# Patient Record
Sex: Female | Born: 1955 | Race: White | Hispanic: No | Smoking: Former smoker
Health system: Southern US, Community
[De-identification: ages and names within clinical notes are randomized; demographics above are authoritative.]

## PROBLEM LIST (undated history)

## (undated) DIAGNOSIS — I10 Essential (primary) hypertension: Secondary | ICD-10-CM

## (undated) DIAGNOSIS — Z9889 Other specified postprocedural states: Secondary | ICD-10-CM

## (undated) DIAGNOSIS — J189 Pneumonia, unspecified organism: Secondary | ICD-10-CM

## (undated) DIAGNOSIS — I428 Other cardiomyopathies: Secondary | ICD-10-CM

## (undated) DIAGNOSIS — F1010 Alcohol abuse, uncomplicated: Secondary | ICD-10-CM

## (undated) DIAGNOSIS — M109 Gout, unspecified: Secondary | ICD-10-CM

## (undated) HISTORY — PX: TONSILLECTOMY: SUR1361

## (undated) HISTORY — PX: KNEE ARTHROSCOPY: SUR90

---

## 1998-06-04 ENCOUNTER — Ambulatory Visit (HOSPITAL_COMMUNITY): Admission: RE | Admit: 1998-06-04 | Discharge: 1998-06-04 | Payer: Self-pay | Admitting: Gastroenterology

## 1998-12-11 ENCOUNTER — Other Ambulatory Visit: Admission: RE | Admit: 1998-12-11 | Discharge: 1998-12-11 | Payer: Self-pay | Admitting: Gynecology

## 1999-12-24 ENCOUNTER — Other Ambulatory Visit: Admission: RE | Admit: 1999-12-24 | Discharge: 1999-12-24 | Payer: Self-pay | Admitting: Gynecology

## 2001-01-22 ENCOUNTER — Other Ambulatory Visit: Admission: RE | Admit: 2001-01-22 | Discharge: 2001-01-22 | Payer: Self-pay | Admitting: Gynecology

## 2002-02-18 ENCOUNTER — Other Ambulatory Visit: Admission: RE | Admit: 2002-02-18 | Discharge: 2002-02-18 | Payer: Self-pay | Admitting: Gynecology

## 2003-06-26 ENCOUNTER — Other Ambulatory Visit: Admission: RE | Admit: 2003-06-26 | Discharge: 2003-06-26 | Payer: Self-pay | Admitting: Gynecology

## 2004-03-11 ENCOUNTER — Other Ambulatory Visit: Admission: RE | Admit: 2004-03-11 | Discharge: 2004-03-11 | Payer: Self-pay | Admitting: Gynecology

## 2004-10-30 ENCOUNTER — Encounter: Admission: RE | Admit: 2004-10-30 | Discharge: 2004-10-30 | Payer: Self-pay | Admitting: Internal Medicine

## 2005-09-22 ENCOUNTER — Other Ambulatory Visit: Admission: RE | Admit: 2005-09-22 | Discharge: 2005-09-22 | Payer: Self-pay | Admitting: Gynecology

## 2006-10-26 ENCOUNTER — Other Ambulatory Visit: Admission: RE | Admit: 2006-10-26 | Discharge: 2006-10-26 | Payer: Self-pay | Admitting: Gynecology

## 2008-01-24 ENCOUNTER — Other Ambulatory Visit: Admission: RE | Admit: 2008-01-24 | Discharge: 2008-01-24 | Payer: Self-pay | Admitting: Gynecology

## 2012-01-12 ENCOUNTER — Other Ambulatory Visit: Payer: Self-pay | Admitting: Family Medicine

## 2012-01-12 ENCOUNTER — Ambulatory Visit
Admission: RE | Admit: 2012-01-12 | Discharge: 2012-01-12 | Disposition: A | Discharge status: Home / Self Care | Payer: BC Managed Care – PPO | Source: Ambulatory Visit | Attending: Family Medicine | Admitting: Family Medicine

## 2012-01-12 DIAGNOSIS — S060XAA Concussion with loss of consciousness status unknown, initial encounter: Secondary | ICD-10-CM

## 2012-01-12 DIAGNOSIS — S060X9A Concussion with loss of consciousness of unspecified duration, initial encounter: Secondary | ICD-10-CM

## 2017-09-18 ENCOUNTER — Other Ambulatory Visit: Payer: Self-pay

## 2017-09-18 ENCOUNTER — Emergency Department (HOSPITAL_COMMUNITY): Payer: BC Managed Care – PPO

## 2017-09-18 ENCOUNTER — Inpatient Hospital Stay (HOSPITAL_COMMUNITY)
Admission: EM | Admit: 2017-09-18 | Discharge: 2017-09-25 | DRG: 286 | Disposition: A | Discharge status: Home / Self Care | Payer: BC Managed Care – PPO | Attending: Cardiovascular Disease | Admitting: Cardiovascular Disease

## 2017-09-18 DIAGNOSIS — Z79899 Other long term (current) drug therapy: Secondary | ICD-10-CM

## 2017-09-18 DIAGNOSIS — R55 Syncope and collapse: Secondary | ICD-10-CM | POA: Diagnosis not present

## 2017-09-18 DIAGNOSIS — I5021 Acute systolic (congestive) heart failure: Secondary | ICD-10-CM

## 2017-09-18 DIAGNOSIS — R0609 Other forms of dyspnea: Secondary | ICD-10-CM

## 2017-09-18 DIAGNOSIS — I110 Hypertensive heart disease with heart failure: Principal | ICD-10-CM | POA: Diagnosis present

## 2017-09-18 DIAGNOSIS — F1010 Alcohol abuse, uncomplicated: Secondary | ICD-10-CM

## 2017-09-18 DIAGNOSIS — D7589 Other specified diseases of blood and blood-forming organs: Secondary | ICD-10-CM | POA: Diagnosis present

## 2017-09-18 DIAGNOSIS — M109 Gout, unspecified: Secondary | ICD-10-CM | POA: Diagnosis present

## 2017-09-18 DIAGNOSIS — Z91013 Allergy to seafood: Secondary | ICD-10-CM

## 2017-09-18 DIAGNOSIS — J449 Chronic obstructive pulmonary disease, unspecified: Secondary | ICD-10-CM | POA: Diagnosis present

## 2017-09-18 DIAGNOSIS — I447 Left bundle-branch block, unspecified: Secondary | ICD-10-CM

## 2017-09-18 DIAGNOSIS — I428 Other cardiomyopathies: Secondary | ICD-10-CM

## 2017-09-18 DIAGNOSIS — Z8701 Personal history of pneumonia (recurrent): Secondary | ICD-10-CM

## 2017-09-18 DIAGNOSIS — Z7141 Alcohol abuse counseling and surveillance of alcoholic: Secondary | ICD-10-CM

## 2017-09-18 DIAGNOSIS — W19XXXA Unspecified fall, initial encounter: Secondary | ICD-10-CM | POA: Diagnosis not present

## 2017-09-18 DIAGNOSIS — E785 Hyperlipidemia, unspecified: Secondary | ICD-10-CM | POA: Diagnosis present

## 2017-09-18 DIAGNOSIS — Z87891 Personal history of nicotine dependence: Secondary | ICD-10-CM

## 2017-09-18 DIAGNOSIS — I959 Hypotension, unspecified: Secondary | ICD-10-CM | POA: Diagnosis not present

## 2017-09-18 DIAGNOSIS — I10 Essential (primary) hypertension: Secondary | ICD-10-CM

## 2017-09-18 HISTORY — DX: Essential (primary) hypertension: I10

## 2017-09-18 HISTORY — DX: Other cardiomyopathies: I42.8

## 2017-09-18 HISTORY — DX: Other specified postprocedural states: Z98.890

## 2017-09-18 HISTORY — DX: Gout, unspecified: M10.9

## 2017-09-18 HISTORY — DX: Pneumonia, unspecified organism: J18.9

## 2017-09-18 HISTORY — DX: Alcohol abuse, uncomplicated: F10.10

## 2017-09-18 LAB — CBC
HEMATOCRIT: 40.8 % (ref 36.0–46.0)
Hemoglobin: 14 g/dL (ref 12.0–15.0)
MCH: 35.4 pg — ABNORMAL HIGH (ref 26.0–34.0)
MCHC: 34.3 g/dL (ref 30.0–36.0)
MCV: 103.3 fL — AB (ref 78.0–100.0)
Platelets: 164 10*3/uL (ref 150–400)
RBC: 3.95 MIL/uL (ref 3.87–5.11)
RDW: 12.6 % (ref 11.5–15.5)
WBC: 6.8 10*3/uL (ref 4.0–10.5)

## 2017-09-18 LAB — BASIC METABOLIC PANEL
ANION GAP: 13 (ref 5–15)
BUN: 16 mg/dL (ref 8–23)
CO2: 20 mmol/L — ABNORMAL LOW (ref 22–32)
Calcium: 9.3 mg/dL (ref 8.9–10.3)
Chloride: 107 mmol/L (ref 98–111)
Creatinine, Ser: 0.94 mg/dL (ref 0.44–1.00)
GFR calc non Af Amer: >60 mL/min (ref >60)
Glucose, Bld: 97 mg/dL (ref 70–99)
POTASSIUM: 3.7 mmol/L (ref 3.5–5.1)
SODIUM: 140 mmol/L (ref 135–145)

## 2017-09-18 LAB — I-STAT TROPONIN, ED: Troponin i, poc: 0.04 ng/mL (ref 0.00–0.08)

## 2017-09-18 NOTE — ED Triage Notes (Signed)
Patient c/o SOB. States that she received an email today from [employer] and was told to follow up at the emergency dept.

## 2017-09-19 ENCOUNTER — Emergency Department (HOSPITAL_BASED_OUTPATIENT_CLINIC_OR_DEPARTMENT_OTHER): Payer: BC Managed Care – PPO

## 2017-09-19 ENCOUNTER — Encounter (HOSPITAL_COMMUNITY): Payer: Self-pay | Admitting: Emergency Medicine

## 2017-09-19 DIAGNOSIS — I447 Left bundle-branch block, unspecified: Secondary | ICD-10-CM | POA: Diagnosis not present

## 2017-09-19 DIAGNOSIS — R0609 Other forms of dyspnea: Secondary | ICD-10-CM

## 2017-09-19 DIAGNOSIS — I5021 Acute systolic (congestive) heart failure: Secondary | ICD-10-CM

## 2017-09-19 DIAGNOSIS — I10 Essential (primary) hypertension: Secondary | ICD-10-CM

## 2017-09-19 DIAGNOSIS — F1010 Alcohol abuse, uncomplicated: Secondary | ICD-10-CM

## 2017-09-19 LAB — BRAIN NATRIURETIC PEPTIDE: B NATRIURETIC PEPTIDE 5: 1338.8 pg/mL — AB (ref 0.0–100.0)

## 2017-09-19 LAB — I-STAT TROPONIN, ED: Troponin i, poc: 0.05 ng/mL (ref 0.00–0.08)

## 2017-09-19 LAB — D-DIMER, QUANTITATIVE (NOT AT ARMC): D DIMER QUANT: 1.71 ug{FEU}/mL — AB (ref 0.00–0.50)

## 2017-09-19 LAB — ECHOCARDIOGRAM COMPLETE
HEIGHTINCHES: 67 in
WEIGHTICAEL: 2352 [oz_av]

## 2017-09-19 LAB — TROPONIN I: Troponin I: 0.06 ng/mL (ref <0.03)

## 2017-09-19 LAB — HEPARIN LEVEL (UNFRACTIONATED): Heparin Unfractionated: 0.4 IU/mL (ref 0.30–0.70)

## 2017-09-19 MED ORDER — LOSARTAN POTASSIUM 50 MG PO TABS
100.0000 mg | ORAL_TABLET | Freq: Every day | ORAL | Status: DC
  Administered 2017-09-20 – 2017-09-21 (×2): 100 mg via ORAL
  Filled 2017-09-19 (×2): qty 2

## 2017-09-19 MED ORDER — FUROSEMIDE 10 MG/ML IJ SOLN
40.0000 mg | Freq: Once | INTRAMUSCULAR | Status: AC
  Administered 2017-09-19: 40 mg via INTRAVENOUS
  Filled 2017-09-19: qty 4

## 2017-09-19 MED ORDER — ASPIRIN 81 MG PO CHEW
324.0000 mg | CHEWABLE_TABLET | Freq: Once | ORAL | Status: AC
  Administered 2017-09-19: 324 mg via ORAL
  Filled 2017-09-19: qty 4

## 2017-09-19 MED ORDER — IPRATROPIUM-ALBUTEROL 0.5-2.5 (3) MG/3ML IN SOLN
3.0000 mL | Freq: Four times a day (QID) | RESPIRATORY_TRACT | Status: DC | PRN
Start: 2017-09-19 — End: 2017-09-25

## 2017-09-19 MED ORDER — ACETAMINOPHEN 325 MG PO TABS
650.0000 mg | ORAL_TABLET | ORAL | Status: DC | PRN
  Administered 2017-09-23: 650 mg via ORAL
  Filled 2017-09-19: qty 2

## 2017-09-19 MED ORDER — ONDANSETRON HCL 4 MG/2ML IJ SOLN: 4.0000 mg | Freq: Four times a day (QID) | INTRAMUSCULAR | Status: DC | PRN

## 2017-09-19 MED ORDER — LORAZEPAM 1 MG PO TABS
1.0000 mg | ORAL_TABLET | Freq: Four times a day (QID) | ORAL | Status: AC | PRN
  Administered 2017-09-19: 1 mg via ORAL
  Filled 2017-09-19: qty 1

## 2017-09-19 MED ORDER — LOSARTAN POTASSIUM 50 MG PO TABS
50.0000 mg | ORAL_TABLET | Freq: Once | ORAL | Status: AC
  Administered 2017-09-19: 50 mg via ORAL
  Filled 2017-09-19 (×2): qty 1

## 2017-09-19 MED ORDER — HEPARIN BOLUS VIA INFUSION
4000.0000 [IU] | Freq: Once | INTRAVENOUS | Status: AC
  Administered 2017-09-19: 4000 [IU] via INTRAVENOUS
  Filled 2017-09-19: qty 4000

## 2017-09-19 MED ORDER — ALBUTEROL SULFATE (2.5 MG/3ML) 0.083% IN NEBU
5.0000 mg | INHALATION_SOLUTION | Freq: Once | RESPIRATORY_TRACT | Status: AC
  Administered 2017-09-19: 5 mg via RESPIRATORY_TRACT
  Filled 2017-09-19: qty 6

## 2017-09-19 MED ORDER — FUROSEMIDE 10 MG/ML IJ SOLN: 20.0000 mg | Freq: Once | INTRAMUSCULAR | Status: DC

## 2017-09-19 MED ORDER — HEPARIN (PORCINE) IN NACL 100-0.45 UNIT/ML-% IJ SOLN
850.0000 [IU]/h | INTRAMUSCULAR | Status: DC
  Administered 2017-09-19: 850 [IU]/h via INTRAVENOUS
  Filled 2017-09-19: qty 250

## 2017-09-19 MED ORDER — ENOXAPARIN SODIUM 40 MG/0.4ML ~~LOC~~ SOLN: 40.0000 mg | SUBCUTANEOUS | Status: DC

## 2017-09-19 MED ORDER — LORAZEPAM 2 MG/ML IJ SOLN: 1.0000 mg | Freq: Four times a day (QID) | INTRAMUSCULAR | Status: AC | PRN

## 2017-09-19 MED ORDER — THIAMINE HCL 100 MG/ML IJ SOLN
100.0000 mg | Freq: Every day | INTRAMUSCULAR | Status: DC
Start: 2017-09-19 — End: 2017-09-25
  Filled 2017-09-19: qty 2

## 2017-09-19 MED ORDER — SODIUM CHLORIDE 0.9% FLUSH
3.0000 mL | Freq: Two times a day (BID) | INTRAVENOUS | Status: DC
  Administered 2017-09-19 – 2017-09-24 (×8): 3 mL via INTRAVENOUS

## 2017-09-19 MED ORDER — ATORVASTATIN CALCIUM 80 MG PO TABS
80.0000 mg | ORAL_TABLET | Freq: Every day | ORAL | Status: DC
  Administered 2017-09-19 – 2017-09-21 (×3): 80 mg via ORAL
  Filled 2017-09-19 (×3): qty 1

## 2017-09-19 MED ORDER — LOSARTAN POTASSIUM 50 MG PO TABS
50.0000 mg | ORAL_TABLET | Freq: Every day | ORAL | Status: DC
  Administered 2017-09-19: 50 mg via ORAL
  Filled 2017-09-19 (×2): qty 1

## 2017-09-19 MED ORDER — SODIUM CHLORIDE 0.9 % IV SOLN: 250.0000 mL | INTRAVENOUS | Status: DC | PRN

## 2017-09-19 MED ORDER — SODIUM CHLORIDE 0.9% FLUSH: 3.0000 mL | INTRAVENOUS | Status: DC | PRN

## 2017-09-19 MED ORDER — ADULT MULTIVITAMIN W/MINERALS CH
1.0000 | ORAL_TABLET | Freq: Every day | ORAL | Status: DC
  Administered 2017-09-19 – 2017-09-25 (×7): 1 via ORAL
  Filled 2017-09-19 (×7): qty 1

## 2017-09-19 MED ORDER — PREDNISONE 20 MG PO TABS
60.0000 mg | ORAL_TABLET | Freq: Once | ORAL | Status: AC
  Administered 2017-09-19: 60 mg via ORAL
  Filled 2017-09-19: qty 3

## 2017-09-19 MED ORDER — ASPIRIN EC 81 MG PO TBEC: 81.0000 mg | DELAYED_RELEASE_TABLET | Freq: Every day | ORAL | Status: DC

## 2017-09-19 MED ORDER — VITAMIN B-1 100 MG PO TABS
100.0000 mg | ORAL_TABLET | Freq: Every day | ORAL | Status: DC
  Administered 2017-09-19 – 2017-09-25 (×7): 100 mg via ORAL
  Filled 2017-09-19 (×7): qty 1

## 2017-09-19 MED ORDER — IPRATROPIUM-ALBUTEROL 0.5-2.5 (3) MG/3ML IN SOLN
3.0000 mL | Freq: Once | RESPIRATORY_TRACT | Status: AC
  Administered 2017-09-19: 3 mL via RESPIRATORY_TRACT
  Filled 2017-09-19: qty 3

## 2017-09-19 MED ORDER — FOLIC ACID 1 MG PO TABS
1.0000 mg | ORAL_TABLET | Freq: Every day | ORAL | Status: DC
  Administered 2017-09-19 – 2017-09-25 (×7): 1 mg via ORAL
  Filled 2017-09-19 (×7): qty 1

## 2017-09-19 NOTE — ED Notes (Signed)
Lab calls to say d-dimer inadequate specimen. Will reoder and redraw. Echo currently at bedsdie.

## 2017-09-19 NOTE — Progress Notes (Signed)
ANTICOAGULATION CONSULT NOTE - Follow Up Consult  Pharmacy Consult for heparin Indication: chest pain/ACS  Labs: Recent Labs    09/18/17 2135 09/19/17 1224 09/19/17 2222  HGB 14.0  --   --   HCT 40.8  --   --   PLT 164  --   --   HEPARINUNFRC  --   --  0.40  CREATININE 0.94  --   --   TROPONINI  --  0.06*  --     Assessment/Plan:  62yo female therapeutic on heparin with initial dosing for r/o ACS. Will continue gtt at current rate and confirm stable with am labs.   Vernard Gambles, PharmD, BCPS  09/19/2017,11:22 PM

## 2017-09-19 NOTE — Progress Notes (Signed)
ANTICOAGULATION CONSULT NOTE - Initial Consult  Pharmacy Consult for Heparin Indication: chest pain/ACS  Allergies  Allergen Reactions  . Shellfish Allergy Swelling    Patient Measurements: Height: 5\' 7"  (170.2 cm) Weight: 147 lb (66.7 kg) IBW/kg (Calculated) : 61.6 Heparin Dosing Weight:  61.6 kg  Vital Signs: BP: 140/81 (08/13 1415) Pulse Rate: 93 (08/13 1415)  Labs: Recent Labs    09/18/17 2135 09/19/17 1224  HGB 14.0  --   HCT 40.8  --   PLT 164  --   CREATININE 0.94  --   TROPONINI  --  0.06*    Estimated Creatinine Clearance: 61.1 mL/min (by C-G formula based on SCr of 0.94 mg/dL).   Medical History: Past Medical History:  Diagnosis Date  . Alcohol abuse   . Gout   . Hypertension   . Recurrent pneumonia     Assessment: CC/HPI: SOB, LBBB  PMH:  HTN, gout, and recurrent PNA, h/o tobacco, heavy ETOH  Significant events: Patient with a h/o COPD and recurrent PNA went to [facility] walk in clinic on Sunday for SOB/DOE and fatigue.  Was told she has a LBBB on EKG and was to be referred to cardiology. Was emailed to come to the ED this evening secondary to a lab test   Anticoag: Start IV heparin to r/o ischemia. Ddimer 1.71. CBC WNL  Goal of Therapy:  Heparin level 0.3-0.7 units/ml Monitor platelets by anticoagulation protocol: Yes   Plan:   Start IV heparin 4000 units Heparin infusion at 850 units/hr Check heparin level in 6-8 hrs Daily HL and CBC   Judine Arciniega S. Merilynn Finland, PharmD, BCPS Clinical Staff Pharmacist Pager [phone]  Misty Stanley Stillinger 09/19/2017,3:15 PM

## 2017-09-19 NOTE — ED Notes (Signed)
Kroeger PA  Contacted with abnormal Troponin.

## 2017-09-19 NOTE — H&P (Addendum)
Cardiology Admission History and Physical:   Patient ID: Beverly Andrews; MRN: [id number]; DOB: August 03, 1955   Admission date: 09/18/2017  Primary Care Provider: [facility] Primary Cardiologist: New to [facility]; Dr. Royann Shivers Primary Electrophysiologist:  None  Chief Complaint:  DOE  Patient Profile:   Beverly Andrews is a 62 y.o. female with a history of HTN, gout, and recurrent PNA who follows at [facility], who is being seen today for the evaluation of new LBBB and DOE at the request of the ED  History of Present Illness:   [name] has been feeling poorly since she was diagnosed with PNA 2 months ago. She completed 2 courses of antibiotics (most recently 3 weeks ago) and reported some improvement in symptoms. On Thursday/Friday of last week, she noted worsening DOE and non-productive coughing. She was worried about recurrent PNA so she presented to the [facility] walk-in clinic on Sunday with the same complaints of SOB/DOE, coughing, and fatigue. EKG at the time revealed LBBB. Per patient, her last EKG prior to this one was >10 years ago and reportedly normal. She was without CP complaints at that time. She received an email from someone at [facility] on Monday 09/18/17 instructing her to present to the ED for evaluation. ED RN received a call from [facility] that her BNP vs Pro-BNP was 644 and that result prompted the email suggesting presentation to the ED.   She reports prior history of a heart murmur but has never had an echocardiogram. Also with HTN recently off of her medication for one month, restarted 08/2017. Denies prior cardiac disease and has never had an ischemic evaluation. She is a former smoker who quit 6 months ago. She reports heavy ETOH use, drinking ~3 cocktails per day - last drink was 09/18/17 in the evening. She denies prior history of ETOH withdrawal/DTs. She denies family history of CAD or CHF; father had aortic valve  replacement and AAA.  At the time of this evaluation she reports having significant DOE which worsening last Thursday/Friday. She reports orthopnea but denies PND or LE edema. She reports intermittent palpitations which last for seconds and are not associated with chest pain. She has some intermittent sharp epigastric/lower sub sternal chest pain which she attributes to heartburn, which last for seconds at a time and are not brought on with exertion. She denies recent travel but states she has not been as active as usual for the past couple of months given recent illness. She denies prior oncological history. She reports intermittent chills/sweats but does not have a thermometer at home to monitor her temp. She also reports worsening memory over the past 6 months, as well as a slight tremor.   ED course: intermittently tachycardic to the 100s, persistently hypertensive, otherwise VSS. Labs notable for K 3.7, Cr 0.94, Hgb 14.0, PLT 164, Trop 0.04>0.05. EKG with sinus rhythm with LBBB - no comparison available. CXR without acute findings. Cardiology asked to evaluate for further recommendations.     Past Medical History:  Diagnosis Date  . Alcohol abuse   . Gout   . Hypertension   . Recurrent pneumonia     Past Surgical History:  Procedure Laterality Date  . KNEE ARTHROSCOPY    . TONSILLECTOMY       Medications Prior to Admission: Prior to Admission medications   Medication Sig Start Date End Date Taking? Authorizing Provider  losartan (COZAAR) 50 MG tablet Take 50 mg by mouth daily.   Yes [provider]     Allergies:    Allergies  Allergen Reactions  . Shellfish Allergy Swelling    Social History:   Social History   Socioeconomic History  . Marital status: Divorced    Spouse name: Not on file  . Number of children: Not on file  . Years of education: Not on file  . Highest education level: Not on file  Occupational History  . Occupation: Works at an [employer]  . Financial resource strain: Not on file  . Food insecurity:    Worry: Not on file    Inability: Not on file  . Transportation needs:    Medical: Not on file    Non-medical: Not on file  Tobacco Use  . Smoking status: Former Games developer  . Smokeless tobacco: Never Used  . Tobacco comment: quit 6 months ago  Substance and Sexual Activity  . Alcohol use: Yes    Alcohol/week: 21.0 standard drinks    Types: 21 Shots of liquor per week  . Drug use: Not on file  . Sexual activity: Not on file  Lifestyle  . Physical activity:    Days per week: Not on file    Minutes per session: Not on file  . Stress: Not on file  Relationships  . Social connections:    Talks on phone: Not on file    Gets together: Not on file    Attends religious service: Not on file    Active member of club or organization: Not on file    Attends meetings of clubs or organizations: Not on file    Relationship status: Not on file  . Intimate partner violence:    Fear of current or ex partner: Not on file    Emotionally abused: Not on file    Physically abused: Not on file    Forced sexual activity: Not on file  Other Topics Concern  . Not on file  Social History Narrative  . Not on file    Family History:   The patient's family history includes AAA (abdominal aortic aneurysm) in her father; Aortic stenosis in her father.   She denies family history of CAD or CHF.  ROS:  Please see the history of present illness.  All other ROS reviewed and negative.     Physical Exam/Data:   Vitals:   09/19/17 1145 09/19/17 1200 09/19/17 1215 09/19/17 1230  BP: (!) 143/73 139/76 134/74 (!) 142/72  Pulse: 91 86 86 86  Resp: 19 (!) 24 (!) 24 20  Temp:      TempSrc:      SpO2: 98% 98% 99% 98%  Weight:      Height:       No intake or output data in the 24 hours ending 09/19/17 1246 Filed Weights   09/18/17 2132  Weight: 66.7 kg   Body mass index is 23.02 kg/m.  General:  Well nourished, well  developed, in no acute distress HEENT: sclera anicteric Neck: no JVD Endocrine:  No thryomegaly Vascular: No carotid bruits; distal pulses 2+ bilaterally  Cardiac:  normal S1, S2; RRR; no murmurs, gallops, or rubs Lungs:  clear to auscultation bilaterally, no wheezing, rhonchi or rales; non-productive coughing on exam Abd: soft, nontender, no hepatomegaly  Ext: no edema, no calf TTP Musculoskeletal:  No deformities, BUE and BLE strength normal and equal Skin: warm and dry  Neuro:  CNs 2-12 intact, no focal abnormalities noted; mild bilateral hand tremor; mild tongue  fasiculations Psych:  Normal affect    EKG:  Sinus rhythm with LBBB - no comparison available.   Relevant CV Studies: Echo pending  Laboratory Data:  Chemistry Recent Labs  Lab 09/18/17 2135  NA 140  K 3.7  CL 107  CO2 20*  GLUCOSE 97  BUN 16  CREATININE 0.94  CALCIUM 9.3  GFRNONAA >60  GFRAA >60  ANIONGAP 13    No results for input(s): PROT, ALBUMIN, AST, ALT, ALKPHOS, BILITOT in the last 168 hours. Hematology Recent Labs  Lab 09/18/17 2135  WBC 6.8  RBC 3.95  HGB 14.0  HCT 40.8  MCV 103.3*  MCH 35.4*  MCHC 34.3  RDW 12.6  PLT 164   Cardiac EnzymesNo results for input(s): TROPONINI in the last 168 hours.  Recent Labs  Lab 09/18/17 2154 09/19/17 0320  TROPIPOC 0.04 0.05    BNPNo results for input(s): BNP, PROBNP in the last 168 hours.  DDimer No results for input(s): DDIMER in the last 168 hours.  Radiology/Studies:  Dg Chest 2 View  Result Date: 09/18/2017 CLINICAL DATA:  Chest pain and shortness of breath. Recent diagnosis of pneumonia for 2 days. EXAM: CHEST - 2 VIEW COMPARISON:  07/21/2017 FINDINGS: Normal heart size and pulmonary vascularity. No focal airspace disease or consolidation in the lungs. No blunting of costophrenic angles. No pneumothorax. Mediastinal contours appear intact. Mild degenerative changes in the spine. IMPRESSION: No active cardiopulmonary disease.  Electronically Signed   By: Burman Nieves M.D.   On: 09/18/2017 22:16    Assessment and Plan:   1. DOE: patient presented with chronic SOB with worsening DOE since last Thursday/Friday. Presented to a walk-in clinic on Sunday and was concerned for possible recurrent PNA. EKG at that time revealed new LBBB (last EKG >10 years ago presumably normal), and labs with BNP vs Pro-BNP of 644. She reports orthopnea but denies PND or LE edema. CXR is without edema. She appears euvolemic on exam but is short of breath.  - Echo ordered to assess LV function - BNP and Ddimer ordered - If echo reveals decreased LV function, will likely need a cardiac cath to further evaluate for ischemic causes. If normal LV function, can likely pursue outpatient ischemic evaluation given lack of CP complaints.   2. New LBBB: unclear acuity of EKG changes. Prior to Sunday, her last EKG was >10 years ago and reportedly normal. She is without significant CP complaints.  - Echo pending - If echo reveals decreased LV function, will likely need a cardiac cath to further evaluate for ischemic causes. If normal LV function, can likely pursue outpatient ischemic evaluation given lack of CP complaints.   3. HTN: BP persistently with SBP>140. She was recently off of her losartan for 1 month which was restarted 08/2017.  - Will uptitrate losartan to 100mg  daily for better BP control - Further antihypertensive recommendations pending echo results  4. History of recurrent PNA: reports recent PNA 2 months ago which did not improve, now completed 2 antibiotics courses (most recently 3 weeks ago). CXR without PNA. Still with persistent non-productive cough. She is a former smoker and would not be surprised if she has underlying COPD.  - Will continue prn nebulizers for SOB/ wheezing.  - Recommend outpatient follow-up with PCP for PFT consideration  5. ETOH abuse: patient reports drinking 3 cocktails per night. Last drink was last night (2  cocktails). No prior history of withdrawal/DTs. She does have a slight tremor on exam which she states has  been going on for 6 months. Mild tongue fasciculations noted as well.  - Monitor CIWA   Severity of Illness: The appropriate patient status for this patient is OBSERVATION. Observation status is judged to be reasonable and necessary in order to provide the required intensity of service to ensure the patient's safety. The patient's presenting symptoms, physical exam findings, and initial radiographic and laboratory data in the context of their medical condition is felt to place them at decreased risk for further clinical deterioration. Furthermore, it is anticipated that the patient will be medically stable for discharge from the hospital within 2 midnights of admission. The following factors support the patient status of observation.   " The patient's presenting symptoms include DOE. " The physical exam findings include benign cardiopulmonary exam. " The initial radiographic and laboratory data are EKG with LBBB.     For questions or updates, please contact [facility] Please consult [email or web address] for contact info under Cardiology/STEMI.    Signed, Beatriz Stallion, PA-C  09/19/2017 12:46 PM   I have seen and examined the patient along with Beatriz Stallion, PA-C .  I have reviewed the chart, notes and new data.  I agree with PA/NP's note.  Key new complaints: symptoms are not entirely typical, but are mostly suggestive of left heart failure. No angina.  Key examination changes: paradoxically split S2, no murmurs Key new findings / data: CXR without infiltrates, but in my view there is cardiomegaly and straightening of the left heart border, c/w with that diagnosis. ECG nondiagnostic due to LBBB, unknown chronicity.  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 Start with echo. If  LVEF is decreased, proceed directly to cardiac cath. If LVEF is normal and there are nor regional wall motion abnormalities, consider coronary CTAngio and simultaneous w/u for chronic lung disease with PFTs. If D-dimer is positive, best to place on heparin, but would delay CTA with contrast in case she needs coronary angiography. Watch for ETOH withdrawal.   Thurmon Fair, MD, [facility] [phone] 09/19/2017, 2:59 PM

## 2017-09-19 NOTE — Progress Notes (Signed)
    Echo with EF 20-25% and diffuse hypokinesis and mild MR. BNP elevated to 1338. Ddimer also elevated to 1.71.   - Will plan for [facility] tomorrow. The patient understands that risks included but are not limited to stroke (1 in 1000), death (1 in 1000), kidney failure [usually temporary] (1 in 500), bleeding (1 in 200), allergic reaction [possibly serious] (1 in 200) and agrees to proceed. NPO after MN - Will give IV lasix 40mg  once and monitor for response  - Will start heparin gtt given elevated Ddimer - defer Chest CTA at this time given need for [facility] tomorrow.   Beatriz Stallion, PA-C

## 2017-09-19 NOTE — ED Notes (Signed)
Attempted to call report

## 2017-09-19 NOTE — ED Notes (Signed)
Cardiology PA Trish contacted to confirm that they will come to see pt.

## 2017-09-19 NOTE — Progress Notes (Signed)
  Echocardiogram 2D Echocardiogram has been performed.  Beverly Andrews 09/19/2017, 1:35 PM

## 2017-09-19 NOTE — ED Notes (Signed)
Cardiology nat bedside.

## 2017-09-19 NOTE — ED Provider Notes (Signed)
[facility] EMERGENCY DEPARTMENT Provider Note   CSN: [id number] Arrival date & time: 09/18/17  2117     History   Chief Complaint Chief Complaint  Patient presents with  . Shortness of Breath    HPI Beverly Andrews is a 62 y.o. female.  The history is provided by the patient.  Shortness of Breath  This is a recurrent problem. The problem occurs continuously.The current episode started more than 2 days ago. The problem has not changed since onset.Associated symptoms include cough and wheezing. Pertinent negatives include no sore throat, no swollen glands, no neck pain, no PND, no vomiting, no abdominal pain, no rash, no leg pain and no leg swelling. It is unknown what precipitated the problem. Risk factors: none. She has tried nothing for the symptoms. The treatment provided no relief.  Patient with a h/o COPD and recurrent PNA went to [facility] walk in clinic on Sunday for SOB/DOE and fatigue.  Was told she has a LBBB on EKG and was to be referred to cardiology. Was emailed to come to the ED this evening secondary to a lab test but the patient does not know which one.    History reviewed. No pertinent past medical history.  There are no active problems to display for this patient.   History reviewed. No pertinent surgical history.   OB History   None      Home Medications    Prior to Admission medications   Medication Sig Start Date End Date Taking? Authorizing Provider  losartan (COZAAR) 50 MG tablet Take 50 mg by mouth daily.   Yes [provider]    Family History No family history on file.  Social History Social History   Tobacco Use  . Smoking status: Not on file  Substance Use Topics  . Alcohol use: Not on file  . Drug use: Not on file     Allergies   Shellfish allergy   Review of Systems Review of Systems  Constitutional: Positive for fatigue. Negative for diaphoresis.  HENT: Negative for sore throat.   Respiratory:  Positive for cough, shortness of breath and wheezing.   Cardiovascular: Negative for palpitations, leg swelling and PND.  Gastrointestinal: Negative for abdominal pain, nausea and vomiting.  Genitourinary: Negative for flank pain.  Musculoskeletal: Negative for neck pain.  Skin: Negative for rash.  All other systems reviewed and are negative.    Physical Exam Updated Vital Signs BP (!) 148/92   Pulse 89   Temp 97.7 F (36.5 C) (Oral)   Resp (!) 26   Ht 5\' 7"  (1.702 m)   Wt 66.7 kg   SpO2 96%   BMI 23.02 kg/m   Physical Exam  Constitutional: She is oriented to person, place, and time. She appears well-developed and well-nourished.  Non-toxic appearance. She does not appear ill. No distress.  HENT:  Head: Normocephalic and atraumatic.  Mouth/Throat: Oropharynx is clear and moist.  Eyes: Pupils are equal, round, and reactive to light. EOM are normal.  Neck: Normal range of motion. Neck supple.  Cardiovascular: Normal rate, regular rhythm, normal heart sounds and intact distal pulses.  Pulmonary/Chest: No respiratory distress. She has decreased breath sounds. She has wheezes.  Abdominal: Soft. Bowel sounds are normal. She exhibits no mass. There is no tenderness. There is no rebound and no guarding.  Musculoskeletal: Normal range of motion. She exhibits no edema or tenderness.  Lymphadenopathy:    She has no cervical adenopathy.  Neurological: She is alert and  oriented to person, place, and time. She displays normal reflexes.  Skin: Skin is warm and dry. Capillary refill takes less than 2 seconds.  Psychiatric: She has a normal mood and affect.     ED Treatments / Results  Labs (all labs ordered are listed, but only abnormal results are displayed) Results for orders placed or performed during the hospital encounter of 09/18/17  Basic metabolic panel  Result Value Ref Range   Sodium 140 135 - 145 mmol/L   Potassium 3.7 3.5 - 5.1 mmol/L   Chloride 107 98 - 111 mmol/L    CO2 20 (L) 22 - 32 mmol/L   Glucose, Bld 97 70 - 99 mg/dL   BUN 16 8 - 23 mg/dL   Creatinine, Ser 0.09 0.44 - 1.00 mg/dL   Calcium 9.3 8.9 - 23.3 mg/dL   GFR calc non Af Amer >60 >60 mL/min   GFR calc Af Amer >60 >60 mL/min   Anion gap 13 5 - 15  CBC  Result Value Ref Range   WBC 6.8 4.0 - 10.5 K/uL   RBC 3.95 3.87 - 5.11 MIL/uL   Hemoglobin 14.0 12.0 - 15.0 g/dL   HCT 00.7 62.2 - 63.3 %   MCV 103.3 (H) 78.0 - 100.0 fL   MCH 35.4 (H) 26.0 - 34.0 pg   MCHC 34.3 30.0 - 36.0 g/dL   RDW 35.4 56.2 - 56.3 %   Platelets 164 150 - 400 K/uL  I-stat troponin, ED  Result Value Ref Range   Troponin i, poc 0.04 0.00 - 0.08 ng/mL   Comment 3          I-stat troponin, ED  Result Value Ref Range   Troponin i, poc 0.05 0.00 - 0.08 ng/mL   Comment 3           Dg Chest 2 View  Result Date: 09/18/2017 CLINICAL DATA:  Chest pain and shortness of breath. Recent diagnosis of pneumonia for 2 days. EXAM: CHEST - 2 VIEW COMPARISON:  07/21/2017 FINDINGS: Normal heart size and pulmonary vascularity. No focal airspace disease or consolidation in the lungs. No blunting of costophrenic angles. No pneumothorax. Mediastinal contours appear intact. Mild degenerative changes in the spine. IMPRESSION: No active cardiopulmonary disease. Electronically Signed   By: Burman Nieves M.D.   On: 09/18/2017 22:16    EKG EKG Interpretation  Date/Time:  Monday September 18 2017 21:30:37 EDT Ventricular Rate:  85 PR Interval:  174 QRS Duration: 150 QT Interval:  456 QTC Calculation: 542 R Axis:   4 Text Interpretation:  Normal sinus rhythm Possible Left atrial enlargement Left bundle branch block Confirmed by Nicanor Alcon, Brynlyn Dade ([id number]) on 09/19/2017 1:36:08 AM   Radiology Dg Chest 2 View  Result Date: 09/18/2017 CLINICAL DATA:  Chest pain and shortness of breath. Recent diagnosis of pneumonia for 2 days. EXAM: CHEST - 2 VIEW COMPARISON:  07/21/2017 FINDINGS: Normal heart size and pulmonary vascularity. No focal airspace  disease or consolidation in the lungs. No blunting of costophrenic angles. No pneumothorax. Mediastinal contours appear intact. Mild degenerative changes in the spine. IMPRESSION: No active cardiopulmonary disease. Electronically Signed   By: Burman Nieves M.D.   On: 09/18/2017 22:16    Procedures Procedures (including critical care time)  Medications Ordered in ED Medications  aspirin chewable tablet 324 mg (has no administration in time range)  albuterol (PROVENTIL) (2.5 MG/3ML) 0.083% nebulizer solution 5 mg (has no administration in time range)  losartan (COZAAR) tablet 50 mg (has no administration  in time range)  ipratropium-albuterol (DUONEB) 0.5-2.5 (3) MG/3ML nebulizer solution 3 mL (3 mLs Nebulization Given 09/19/17 0246)  predniSONE (DELTASONE) tablet 60 mg (60 mg Oral Given 09/19/17 0348)      Final Clinical Impressions(s) / ED Diagnoses   Case d/w cardiology PA, Judy Pimple, to be seen in the ED.     Mandisa Persinger, MD 09/19/17 [phone]

## 2017-09-19 NOTE — Care Management Note (Signed)
Case Management Note  Patient Details  Name: Beverly Andrews MRN: [id number] Date of Birth: 02-Nov-1955  Subjective/Objective:  SOB                 Action/Plan: L[facility]; has private insurance with BCBS with prescription drug coverage; CM will continue to follow for progression of care.  Expected Discharge Date:     Possibly 09/21/2017            Expected Discharge Plan:   Home  Status of Service:   In progress  Reola Mosher [phone] 09/19/2017, 3:37 PM

## 2017-09-19 NOTE — ED Notes (Signed)
Ambulated patient with out complaints.  Oxygen saturation was 96%

## 2017-09-19 NOTE — ED Notes (Signed)
Pt request oob to bathroom. Pt becomes markedly short of breath just moving around to put on sock Sat 99%. Pt offered BSC.

## 2017-09-20 ENCOUNTER — Inpatient Hospital Stay (HOSPITAL_COMMUNITY)
Admission: EM | Disposition: A | Discharge status: Home / Self Care | Payer: Self-pay | Source: Home / Self Care | Attending: Cardiovascular Disease

## 2017-09-20 ENCOUNTER — Encounter (HOSPITAL_COMMUNITY): Payer: Self-pay | Admitting: General Practice

## 2017-09-20 DIAGNOSIS — R0609 Other forms of dyspnea: Secondary | ICD-10-CM

## 2017-09-20 DIAGNOSIS — Z87891 Personal history of nicotine dependence: Secondary | ICD-10-CM | POA: Diagnosis not present

## 2017-09-20 DIAGNOSIS — I10 Essential (primary) hypertension: Secondary | ICD-10-CM | POA: Diagnosis not present

## 2017-09-20 DIAGNOSIS — I5021 Acute systolic (congestive) heart failure: Secondary | ICD-10-CM | POA: Diagnosis present

## 2017-09-20 DIAGNOSIS — Z79899 Other long term (current) drug therapy: Secondary | ICD-10-CM | POA: Diagnosis not present

## 2017-09-20 DIAGNOSIS — D7589 Other specified diseases of blood and blood-forming organs: Secondary | ICD-10-CM | POA: Diagnosis present

## 2017-09-20 DIAGNOSIS — Z7141 Alcohol abuse counseling and surveillance of alcoholic: Secondary | ICD-10-CM | POA: Diagnosis not present

## 2017-09-20 DIAGNOSIS — I110 Hypertensive heart disease with heart failure: Secondary | ICD-10-CM | POA: Diagnosis present

## 2017-09-20 DIAGNOSIS — I429 Cardiomyopathy, unspecified: Secondary | ICD-10-CM

## 2017-09-20 DIAGNOSIS — F1010 Alcohol abuse, uncomplicated: Secondary | ICD-10-CM | POA: Diagnosis present

## 2017-09-20 DIAGNOSIS — W19XXXA Unspecified fall, initial encounter: Secondary | ICD-10-CM | POA: Diagnosis not present

## 2017-09-20 DIAGNOSIS — M109 Gout, unspecified: Secondary | ICD-10-CM | POA: Diagnosis present

## 2017-09-20 DIAGNOSIS — E785 Hyperlipidemia, unspecified: Secondary | ICD-10-CM | POA: Diagnosis present

## 2017-09-20 DIAGNOSIS — R55 Syncope and collapse: Secondary | ICD-10-CM | POA: Diagnosis not present

## 2017-09-20 DIAGNOSIS — I420 Dilated cardiomyopathy: Secondary | ICD-10-CM | POA: Diagnosis not present

## 2017-09-20 DIAGNOSIS — Z91013 Allergy to seafood: Secondary | ICD-10-CM | POA: Diagnosis not present

## 2017-09-20 DIAGNOSIS — Z8701 Personal history of pneumonia (recurrent): Secondary | ICD-10-CM | POA: Diagnosis not present

## 2017-09-20 DIAGNOSIS — I447 Left bundle-branch block, unspecified: Secondary | ICD-10-CM | POA: Diagnosis present

## 2017-09-20 DIAGNOSIS — J449 Chronic obstructive pulmonary disease, unspecified: Secondary | ICD-10-CM | POA: Diagnosis present

## 2017-09-20 DIAGNOSIS — I959 Hypotension, unspecified: Secondary | ICD-10-CM | POA: Diagnosis not present

## 2017-09-20 DIAGNOSIS — I428 Other cardiomyopathies: Secondary | ICD-10-CM | POA: Diagnosis present

## 2017-09-20 HISTORY — PX: RIGHT/LEFT HEART CATH AND CORONARY ANGIOGRAPHY: CATH118266

## 2017-09-20 LAB — BASIC METABOLIC PANEL
Anion gap: 14 (ref 5–15)
BUN: 16 mg/dL (ref 8–23)
CHLORIDE: 104 mmol/L (ref 98–111)
CO2: 25 mmol/L (ref 22–32)
Calcium: 9.5 mg/dL (ref 8.9–10.3)
Creatinine, Ser: 1.05 mg/dL — ABNORMAL HIGH (ref 0.44–1.00)
GFR calc non Af Amer: 56 mL/min — ABNORMAL LOW (ref >60)
Glucose, Bld: 94 mg/dL (ref 70–99)
POTASSIUM: 3.6 mmol/L (ref 3.5–5.1)
SODIUM: 143 mmol/L (ref 135–145)

## 2017-09-20 LAB — POCT I-STAT 3, ART BLOOD GAS (G3+)
ACID-BASE DEFICIT: 1 mmol/L (ref 0.0–2.0)
BICARBONATE: 23.4 mmol/L (ref 20.0–28.0)
O2 Saturation: 97 %
PCO2 ART: 37.9 mmHg (ref 32.0–48.0)
PO2 ART: 89 mmHg (ref 83.0–108.0)
TCO2: 25 mmol/L (ref 22–32)
pH, Arterial: 7.397 (ref 7.350–7.450)

## 2017-09-20 LAB — LIPID PANEL
CHOL/HDL RATIO: 2.5 ratio
Cholesterol: 186 mg/dL (ref 0–200)
HDL: 73 mg/dL (ref >40)
LDL CALC: 99 mg/dL (ref 0–99)
Triglycerides: 71 mg/dL (ref <150)
VLDL: 14 mg/dL (ref 0–40)

## 2017-09-20 LAB — POCT I-STAT 3, VENOUS BLOOD GAS (G3P V)
Acid-Base Excess: 1 mmol/L (ref 0.0–2.0)
Bicarbonate: 25.9 mmol/L (ref 20.0–28.0)
O2 SAT: 76 %
PCO2 VEN: 43.2 mmHg — AB (ref 44.0–60.0)
PH VEN: 7.386 (ref 7.250–7.430)
TCO2: 27 mmol/L (ref 22–32)
pO2, Ven: 41 mmHg (ref 32.0–45.0)

## 2017-09-20 LAB — CBC
HEMATOCRIT: 42 % (ref 36.0–46.0)
HEMOGLOBIN: 14.2 g/dL (ref 12.0–15.0)
MCH: 35.5 pg — ABNORMAL HIGH (ref 26.0–34.0)
MCHC: 33.8 g/dL (ref 30.0–36.0)
MCV: 105 fL — ABNORMAL HIGH (ref 78.0–100.0)
Platelets: 176 10*3/uL (ref 150–400)
RBC: 4 MIL/uL (ref 3.87–5.11)
RDW: 13.1 % (ref 11.5–15.5)
WBC: 8.6 10*3/uL (ref 4.0–10.5)

## 2017-09-20 LAB — HEPARIN LEVEL (UNFRACTIONATED): Heparin Unfractionated: 0.43 IU/mL (ref 0.30–0.70)

## 2017-09-20 SURGERY — RIGHT/LEFT HEART CATH AND CORONARY ANGIOGRAPHY
Anesthesia: LOCAL

## 2017-09-20 MED ORDER — HEPARIN (PORCINE) IN NACL 1000-0.9 UT/500ML-% IV SOLN
INTRAVENOUS | Status: AC
  Filled 2017-09-20: qty 1000

## 2017-09-20 MED ORDER — SODIUM CHLORIDE 0.9% FLUSH: 3.0000 mL | INTRAVENOUS | Status: DC | PRN

## 2017-09-20 MED ORDER — SODIUM CHLORIDE 0.9% FLUSH
3.0000 mL | Freq: Two times a day (BID) | INTRAVENOUS | Status: DC
  Administered 2017-09-20: 3 mL via INTRAVENOUS

## 2017-09-20 MED ORDER — VERAPAMIL HCL 2.5 MG/ML IV SOLN
INTRAVENOUS | Status: DC | PRN
  Administered 2017-09-20: 10 mL via INTRA_ARTERIAL

## 2017-09-20 MED ORDER — FUROSEMIDE 40 MG PO TABS
40.0000 mg | ORAL_TABLET | Freq: Every day | ORAL | Status: DC
  Administered 2017-09-21: 40 mg via ORAL
  Filled 2017-09-20 (×3): qty 1

## 2017-09-20 MED ORDER — SODIUM CHLORIDE 0.9 % IV SOLN: 250.0000 mL | INTRAVENOUS | Status: DC | PRN

## 2017-09-20 MED ORDER — SODIUM CHLORIDE 0.9% FLUSH
3.0000 mL | Freq: Two times a day (BID) | INTRAVENOUS | Status: DC
  Administered 2017-09-20 – 2017-09-25 (×6): 3 mL via INTRAVENOUS

## 2017-09-20 MED ORDER — FENTANYL CITRATE (PF) 100 MCG/2ML IJ SOLN
INTRAMUSCULAR | Status: AC
  Filled 2017-09-20: qty 2

## 2017-09-20 MED ORDER — CARVEDILOL 3.125 MG PO TABS
3.1250 mg | ORAL_TABLET | Freq: Two times a day (BID) | ORAL | Status: DC
  Administered 2017-09-20 – 2017-09-22 (×5): 3.125 mg via ORAL
  Filled 2017-09-20 (×8): qty 1

## 2017-09-20 MED ORDER — SODIUM CHLORIDE 0.9 % IV SOLN
INTRAVENOUS | Status: DC
  Administered 2017-09-20: 08:00:00 via INTRAVENOUS

## 2017-09-20 MED ORDER — ENOXAPARIN SODIUM 40 MG/0.4ML ~~LOC~~ SOLN
40.0000 mg | SUBCUTANEOUS | Status: DC
  Administered 2017-09-21 – 2017-09-25 (×5): 40 mg via SUBCUTANEOUS
  Filled 2017-09-20 (×5): qty 0.4

## 2017-09-20 MED ORDER — POTASSIUM CHLORIDE CRYS ER 20 MEQ PO TBCR
40.0000 meq | EXTENDED_RELEASE_TABLET | Freq: Once | ORAL | Status: AC
  Administered 2017-09-20: 40 meq via ORAL
  Filled 2017-09-20: qty 2

## 2017-09-20 MED ORDER — VERAPAMIL HCL 2.5 MG/ML IV SOLN
INTRAVENOUS | Status: AC
  Filled 2017-09-20: qty 2

## 2017-09-20 MED ORDER — MIDAZOLAM HCL 2 MG/2ML IJ SOLN
INTRAMUSCULAR | Status: DC | PRN
  Administered 2017-09-20 (×2): 1 mg via INTRAVENOUS

## 2017-09-20 MED ORDER — HEPARIN SODIUM (PORCINE) 1000 UNIT/ML IJ SOLN
INTRAMUSCULAR | Status: DC | PRN
  Administered 2017-09-20: 3000 [IU] via INTRAVENOUS

## 2017-09-20 MED ORDER — ASPIRIN 81 MG PO CHEW
81.0000 mg | CHEWABLE_TABLET | ORAL | Status: AC
  Administered 2017-09-20: 81 mg via ORAL
  Filled 2017-09-20: qty 1

## 2017-09-20 MED ORDER — MIDAZOLAM HCL 2 MG/2ML IJ SOLN
INTRAMUSCULAR | Status: AC
  Filled 2017-09-20: qty 2

## 2017-09-20 MED ORDER — FENTANYL CITRATE (PF) 100 MCG/2ML IJ SOLN
INTRAMUSCULAR | Status: DC | PRN
  Administered 2017-09-20: 25 ug via INTRAVENOUS

## 2017-09-20 MED ORDER — LIDOCAINE HCL (PF) 1 % IJ SOLN
INTRAMUSCULAR | Status: AC
  Filled 2017-09-20: qty 30

## 2017-09-20 MED ORDER — IOHEXOL 350 MG/ML SOLN
INTRAVENOUS | Status: DC | PRN
  Administered 2017-09-20: 60 mL via INTRACARDIAC

## 2017-09-20 MED ORDER — LIDOCAINE HCL (PF) 1 % IJ SOLN
INTRAMUSCULAR | Status: DC | PRN
  Administered 2017-09-20 (×2): 2 mL

## 2017-09-20 MED ORDER — HEPARIN SODIUM (PORCINE) 1000 UNIT/ML IJ SOLN
INTRAMUSCULAR | Status: AC
  Filled 2017-09-20: qty 1

## 2017-09-20 SURGICAL SUPPLY — 13 items
CATH BALLN WEDGE 5F 110CM (CATHETERS) ×2 IMPLANT
CATH INFINITI 5 FR JL3.5 (CATHETERS) ×2 IMPLANT
CATH INFINITI 5FR JK (CATHETERS) ×2 IMPLANT
CATH INFINITI JR4 5F (CATHETERS) ×2 IMPLANT
DEVICE RAD COMP TR BAND LRG (VASCULAR PRODUCTS) ×2 IMPLANT
GLIDESHEATH SLEND SS 6F .021 (SHEATH) ×2 IMPLANT
GUIDEWIRE INQWIRE 1.5J.035X260 (WIRE) ×1 IMPLANT
INQWIRE 1.5J .035X260CM (WIRE) ×2
KIT HEART LEFT (KITS) ×2 IMPLANT
PACK CARDIAC CATHETERIZATION (CUSTOM PROCEDURE TRAY) ×2 IMPLANT
SHEATH GLIDE SLENDER 4/5FR (SHEATH) ×2 IMPLANT
TRANSDUCER W/STOPCOCK (MISCELLANEOUS) ×2 IMPLANT
TUBING CIL FLEX 10 FLL-RA (TUBING) ×2 IMPLANT

## 2017-09-20 NOTE — Progress Notes (Signed)
ANTICOAGULATION CONSULT NOTE   Pharmacy Consult for Heparin Indication: chest pain/ACS  Allergies  Allergen Reactions  . Shellfish Allergy Swelling    Patient Measurements: Height: 5\' 7"  (170.2 cm) Weight: 140 lb 11.2 oz (63.8 kg) IBW/kg (Calculated) : 61.6 Heparin Dosing Weight:  61.6 kg  Vital Signs: Temp: 98.6 F (37 C) (08/14 0809) Temp Source: Oral (08/14 0809) BP: 131/83 (08/14 0809) Pulse Rate: 71 (08/14 0809)  Labs: Recent Labs    09/18/17 2135 09/19/17 1224 09/19/17 2222 09/20/17 0413  HGB 14.0  --   --  14.2  HCT 40.8  --   --  42.0  PLT 164  --   --  176  HEPARINUNFRC  --   --  0.40 0.43  CREATININE 0.94  --   --  1.05*  TROPONINI  --  0.06*  --   --    Estimated Creatinine Clearance: 54.7 mL/min (A) (by C-G formula based on SCr of 1.05 mg/dL (H)).  Medical History: Past Medical History:  Diagnosis Date  . Alcohol abuse   . Gout   . Hypertension   . Recurrent pneumonia    Assessment: CC/HPI: SOB, LBBB  PMH:  HTN, gout, and recurrent PNA, h/o tobacco, heavy ETOH  Significant events: Patient with a h/o COPD and recurrent PNA went to [facility] in [facility] on Sunday for SOB/DOE and fatigue.  Was told she has a LBBB on EKG and was to be referred to cardiology.  She was started on IV heparin with pharmacy monitoring.  Initial level was within therapeutic range of 0.3-0.7 as well as confirmation level this am at 0.43.  Plans for cardiac cath noted.  Anticoag: IV heparin currently at 850 units/hr.       CBC is stable and no noted bleeding complications  Goal of Therapy:  Heparin level 0.3-0.7 units/ml Monitor platelets by anticoagulation protocol: Yes   Plan:  Continue IV heparin at 850 units/hr Daily HL and CBC F/U plans after cath  Nadara Mustard, PharmD., MS Clinical Pharmacist Pager:  [phone] Thank you for allowing pharmacy to be part of this patients care team. 09/20/2017,8:17 AM

## 2017-09-20 NOTE — H&P (View-Only) (Signed)
 Progress Note  Patient Name: Beverly Andrews Date of Encounter: 09/20/2017  Primary Cardiologist: Mihai Croitoru, MD   Subjective   States her breathing is a little bit better than yesterday but she has not been out of bed much to exert herself. She is anxious about upcoming procedure today. Reported some chest tightness last night.   Inpatient Medications    Scheduled Meds: . aspirin EC  81 mg Oral Daily  . atorvastatin  80 mg Oral q1800  . folic acid  1 mg Oral Daily  . losartan  100 mg Oral Daily  . multivitamin with minerals  1 tablet Oral Daily  . potassium chloride  40 mEq Oral Once  . sodium chloride flush  3 mL Intravenous Q12H  . sodium chloride flush  3 mL Intravenous Q12H  . thiamine  100 mg Oral Daily   Or  . thiamine  100 mg Intravenous Daily   Continuous Infusions: . sodium chloride    . sodium chloride    . sodium chloride 10 mL/hr at 09/20/17 0810  . heparin 850 Units/hr (09/19/17 1949)   PRN Meds: sodium chloride, sodium chloride, acetaminophen, ipratropium-albuterol, LORazepam **OR** LORazepam, ondansetron (ZOFRAN) IV, sodium chloride flush, sodium chloride flush   Vital Signs    Vitals:   09/19/17 2003 09/20/17 0023 09/20/17 0519 09/20/17 0809  BP: 135/79 131/80 135/79 131/83  Pulse: 80 82 77 71  Resp: 18 18 18 18  Temp: 99.1 F (37.3 C) 98.4 F (36.9 C) 98.1 F (36.7 C) 98.6 F (37 C)  TempSrc: Oral Oral Oral Oral  SpO2: 97% 98% 98% 96%  Weight:   63.8 kg   Height:        Intake/Output Summary (Last 24 hours) at 09/20/2017 0835 Last data filed at 09/20/2017 0500 Gross per 24 hour  Intake 311.35 ml  Output 500 ml  Net -188.65 ml   Filed Weights   09/18/17 2132 09/19/17 1516 09/20/17 0519  Weight: 66.7 kg 66.7 kg 63.8 kg    Telemetry    Sinus rhythm with LBBB - Personally Reviewed  Physical Exam   GEN: Laying flat in bed in no acute distress, breathing comfortably.   Neck: No JVD, no carotid bruits Cardiac: RRR, no murmurs,  rubs, or gallops.  Respiratory: Clear to auscultation bilaterally, no wheezes/ rales/ rhonchi GI: NABS, Soft, nontender, non-distended  MS: No edema; No deformity, no calf TTP Neuro:  Nonfocal, moving all extremities spontaneously Psych: Normal affect   Labs    Chemistry Recent Labs  Lab 09/18/17 2135 09/20/17 0413  NA 140 143  K 3.7 3.6  CL 107 104  CO2 20* 25  GLUCOSE 97 94  BUN 16 16  CREATININE 0.94 1.05*  CALCIUM 9.3 9.5  GFRNONAA >60 56*  GFRAA >60 >60  ANIONGAP 13 14     Hematology Recent Labs  Lab 09/18/17 2135 09/20/17 0413  WBC 6.8 8.6  RBC 3.95 4.00  HGB 14.0 14.2  HCT 40.8 42.0  MCV 103.3* 105.0*  MCH 35.4* 35.5*  MCHC 34.3 33.8  RDW 12.6 13.1  PLT 164 176    Cardiac Enzymes Recent Labs  Lab 09/19/17 1224  TROPONINI 0.06*    Recent Labs  Lab 09/18/17 2154 09/19/17 0320  TROPIPOC 0.04 0.05     BNP Recent Labs  Lab 09/19/17 1224  BNP 1,338.8*     DDimer  Recent Labs  Lab 09/19/17 1340  DDIMER 1.71*     Radiology    Dg Chest 2   View  Result Date: 09/18/2017 CLINICAL DATA:  Chest pain and shortness of breath. Recent diagnosis of pneumonia for 2 days. EXAM: CHEST - 2 VIEW COMPARISON:  07/21/2017 FINDINGS: Normal heart size and pulmonary vascularity. No focal airspace disease or consolidation in the lungs. No blunting of costophrenic angles. No pneumothorax. Mediastinal contours appear intact. Mild degenerative changes in the spine. IMPRESSION: No active cardiopulmonary disease. Electronically Signed   By: William  Stevens M.D.   On: 09/18/2017 22:16    Cardiac Studies   Echocardiogram 09/19/17: Study Conclusions  - Left ventricle: The cavity size was normal. Wall thickness was normal. Systolic function was severely reduced. The estimated ejection fraction was in the range of 20% to 25%. Severe diffuse hypokinesis with regional variations. Possible disproportionate hypokinesis of the inferior myocardium. There was fusion of early  and atrial contributions to ventricular filling. No evidence of thrombus. - Ventricular septum: Septal motion showed abnormal function, dyssynergy, and paradox. These changes are consistent with a left bundle branch block. - Mitral valve: There was moderate regurgitation directed   centrally.  Patient Profile     62 y.o. female with a history of HTN, gout,and recurrent PNA who follows at [facility],who was admitted to cardiology for the evaluation of new LBBB and DOE, found to have acute systolic CHF on echo.  Assessment & Plan    1. Acute systolic CHF: patient presented with DOE and fatigue. BNP 1338. CXR without edema. Echo yesterday revealed a severely reduced EF of 20-25% with diffuse hypokinesis. She was given IV lasix 40mg x1 dose yesterday with marginal output, net -188mL in the last 24 hours. She continues to appear euvolemic on exam.  - Planning for R/LHC today - keep NPO - Will hold off on additional IV lasix this morning pending cath - Continue losartan. - Additional CHF medications to be determined following R/LHC  2. New LBBB: unclear acuity but given severely reduced EF will pursue inpatient ischemic evaluation. - Planning for R/LHC today  3. Elevated Ddimer: Ddimer 1.71. Patient was started on a heparin gtt. Chest CTA delayed given need for R/LHC to minimize contrast. Echo yesterday without right heart strain.  - Continue heparin gtt per pharmacy - Anticipate obtaining a chest CTA tomorrow if Cr stable post cath  4.  HTN: BP improved after home losartan was increased to 100mg daily.  - Continue losartan - Anticipate additional antihypertensive/CHF agents will be warranted pending R/LHC today  5. Recurrent PNA/ chronic cough: patient has a significant smoking history; quit ~6 months ago. Suspect she has underlying COPD.  - Continue prn nebulizers for SOB/wheezing - Would benefit from outpatient PFTs after resolution of present illness  6. ETOH abuse: reports  drinking 3 cocktails per night. Last drink was the evening of 8/12. No history of withdrawal/DTs. No evidence of withdrawal on exam today.   - Continue to monitor CIWA  7. HLD: LDL 99 this morning. She was started on atorvastatin yesterday - Continue statin  For questions or updates, please contact [facility] Please consult [email or web address] for contact info under Cardiology/STEMI.      Signed, Krista M. Kroeger, PA-C  09/20/2017, 8:35 AM   [phone]   Patient seen and examined. Agree with assessment and plan. Mild chest fullness. Echo shows EF 20 - 25% with diffuse hypokinesis. Pt previously unaware of any cardiac issues, and  had recent prior pneumonia. BNP elevated at 1338. I/O -188 since admission. For R and L heart cath today. Candidate to transition to entresto from losartan   following cath. Resting pulse in the 70 - 80s, will add carvedilol 3.125 mg bid.  May benefit from spironolactone, but will not start today. With MCV 105, check B12/folate. I have reviewed the risks, indications, and alternatives to cardiac catheterization, possible angioplasty, and stenting with the patient. Risks include but are not limited to bleeding, infection, vascular injury, stroke, myocardial infection, arrhythmia, kidney injury, radiation-related injury in the case of prolonged fluoroscopy use, emergency cardiac surgery, and death. The patient understands the risks of serious complication is 1-2 in 1000 with diagnostic cardiac cath and 1-2% or less with angioplasty/stenting.    Thomas A. Kelly, MD, FACC 09/20/2017 11:19 AM   

## 2017-09-20 NOTE — Progress Notes (Addendum)
Progress Note  Patient Name: Beverly Andrews Date of Encounter: 09/20/2017  Primary Cardiologist: Thurmon Fair, MD   Subjective   States her breathing is a little bit better than yesterday but she has not been out of bed much to exert herself. She is anxious about upcoming procedure today. Reported some chest tightness last night.   Inpatient Medications    Scheduled Meds: . aspirin EC  81 mg Oral Daily  . atorvastatin  80 mg Oral q1800  . folic acid  1 mg Oral Daily  . losartan  100 mg Oral Daily  . multivitamin with minerals  1 tablet Oral Daily  . potassium chloride  40 mEq Oral Once  . sodium chloride flush  3 mL Intravenous Q12H  . sodium chloride flush  3 mL Intravenous Q12H  . thiamine  100 mg Oral Daily   Or  . thiamine  100 mg Intravenous Daily   Continuous Infusions: . sodium chloride    . sodium chloride    . sodium chloride 10 mL/hr at 09/20/17 0810  . heparin 850 Units/hr (09/19/17 1949)   PRN Meds: sodium chloride, sodium chloride, acetaminophen, ipratropium-albuterol, LORazepam **OR** LORazepam, ondansetron (ZOFRAN) IV, sodium chloride flush, sodium chloride flush   Vital Signs    Vitals:   09/19/17 2003 09/20/17 0023 09/20/17 0519 09/20/17 0809  BP: 135/79 131/80 135/79 131/83  Pulse: 80 82 77 71  Resp: 18 18 18 18   Temp: 99.1 F (37.3 C) 98.4 F (36.9 C) 98.1 F (36.7 C) 98.6 F (37 C)  TempSrc: Oral Oral Oral Oral  SpO2: 97% 98% 98% 96%  Weight:   63.8 kg   Height:        Intake/Output Summary (Last 24 hours) at 09/20/2017 0835 Last data filed at 09/20/2017 0500 Gross per 24 hour  Intake 311.35 ml  Output 500 ml  Net -188.65 ml   Filed Weights   09/18/17 2132 09/19/17 1516 09/20/17 0519  Weight: 66.7 kg 66.7 kg 63.8 kg    Telemetry    Sinus rhythm with LBBB - Personally Reviewed  Physical Exam   GEN: Laying flat in bed in no acute distress, breathing comfortably.   Neck: No JVD, no carotid bruits Cardiac: RRR, no murmurs,  rubs, or gallops.  Respiratory: Clear to auscultation bilaterally, no wheezes/ rales/ rhonchi GI: NABS, Soft, nontender, non-distended  MS: No edema; No deformity, no calf TTP Neuro:  Nonfocal, moving all extremities spontaneously Psych: Normal affect   Labs    Chemistry Recent Labs  Lab 09/18/17 2135 09/20/17 0413  NA 140 143  K 3.7 3.6  CL 107 104  CO2 20* 25  GLUCOSE 97 94  BUN 16 16  CREATININE 0.94 1.05*  CALCIUM 9.3 9.5  GFRNONAA >60 56*  GFRAA >60 >60  ANIONGAP 13 14     Hematology Recent Labs  Lab 09/18/17 2135 09/20/17 0413  WBC 6.8 8.6  RBC 3.95 4.00  HGB 14.0 14.2  HCT 40.8 42.0  MCV 103.3* 105.0*  MCH 35.4* 35.5*  MCHC 34.3 33.8  RDW 12.6 13.1  PLT 164 176    Cardiac Enzymes Recent Labs  Lab 09/19/17 1224  TROPONINI 0.06*    Recent Labs  Lab 09/18/17 2154 09/19/17 0320  TROPIPOC 0.04 0.05     BNP Recent Labs  Lab 09/19/17 1224  BNP 1,338.8*     DDimer  Recent Labs  Lab 09/19/17 1340  DDIMER 1.71*     Radiology    Dg Chest 2  View  Result Date: 09/18/2017 CLINICAL DATA:  Chest pain and shortness of breath. Recent diagnosis of pneumonia for 2 days. EXAM: CHEST - 2 VIEW COMPARISON:  07/21/2017 FINDINGS: Normal heart size and pulmonary vascularity. No focal airspace disease or consolidation in the lungs. No blunting of costophrenic angles. No pneumothorax. Mediastinal contours appear intact. Mild degenerative changes in the spine. IMPRESSION: No active cardiopulmonary disease. Electronically Signed   By: Burman Nieves M.D.   On: 09/18/2017 22:16    Cardiac Studies   Echocardiogram 09/19/17: Study Conclusions  - Left ventricle: The cavity size was normal. Wall thickness was normal. Systolic function was severely reduced. The estimated ejection fraction was in the range of 20% to 25%. Severe diffuse hypokinesis with regional variations. Possible disproportionate hypokinesis of the inferior myocardium. There was fusion of early  and atrial contributions to ventricular filling. No evidence of thrombus. - Ventricular septum: Septal motion showed abnormal function, dyssynergy, and paradox. These changes are consistent with a left bundle branch block. - Mitral valve: There was moderate regurgitation directed   centrally.  Patient Profile     62 y.o. female with a history of HTN, gout,and recurrent PNA who follows at [facility] was admitted to cardiology for the evaluation of new LBBB and DOE, found to have acute systolic CHF on echo.  Assessment & Plan    1. Acute systolic CHF: patient presented with DOE and fatigue. BNP 1338. CXR without edema. Echo yesterday revealed a severely reduced EF of 20-25% with diffuse hypokinesis. She was given IV lasix 40mg  x1 dose yesterday with marginal output, net - in the last 24 hours. She continues to appear euvolemic on exam.  - Planning for [facility] today - keep NPO - Will hold off on additional IV lasix this morning pending cath - Continue losartan. - Additional CHF medications to be determined following R/LHC  2. New LBBB: unclear acuity but given severely reduced EF will pursue inpatient ischemic evaluation. - Planning for [facility] today  3. Elevated Ddimer: Ddimer 1.71. Patient was started on a heparin gtt. Chest CTA delayed given need for [facility] to minimize contrast. Echo yesterday without right heart strain.  - Continue heparin gtt per pharmacy - Anticipate obtaining a chest CTA tomorrow if Cr stable post cath  4.  HTN: BP improved after home losartan was increased to 100mg  daily.  - Continue losartan - Anticipate additional antihypertensive/CHF agents will be warranted pending R/LHC today  5. Recurrent PNA/ chronic cough: patient has a significant smoking history; quit ~6 months ago. Suspect she has underlying COPD.  - Continue prn nebulizers for SOB/wheezing - Would benefit from outpatient PFTs after resolution of present illness  6. ETOH abuse: reports  drinking 3 cocktails per night. Last drink was the evening of 8/12. No history of withdrawal/DTs. No evidence of withdrawal on exam today.   - Continue to monitor CIWA  7. HLD: LDL 99 this morning. She was started on atorvastatin yesterday - Continue statin  For questions or updates, please contact [facility] Please consult [email or web address] for contact info under Cardiology/STEMI.      Signed, Beatriz Stallion, PA-C  09/20/2017, 8:35 AM   [phone]   Patient seen and examined. Agree with assessment and plan. Mild chest fullness. Echo shows EF 20 - 25% with diffuse hypokinesis. Pt previously unaware of any cardiac issues, and  had recent prior pneumonia. BNP elevated at 1338. I/O -188 since admission. For R and L heart cath today. Candidate to transition to entresto from losartan  following cath. Resting pulse in the 70 - 80s, will add carvedilol 3.125 mg bid.  May benefit from spironolactone, but will not start today. With MCV 105, check B12/folate. I have reviewed the risks, indications, and alternatives to cardiac catheterization, possible angioplasty, and stenting with the patient. Risks include but are not limited to bleeding, infection, vascular injury, stroke, myocardial infection, arrhythmia, kidney injury, radiation-related injury in the case of prolonged fluoroscopy use, emergency cardiac surgery, and death. The patient understands the risks of serious complication is 1-2 in 1000 with diagnostic cardiac cath and 1-2% or less with angioplasty/stenting.    Lennette Bihari, MD, [facility] 09/20/2017 11:19 AM

## 2017-09-20 NOTE — Interval H&P Note (Signed)
History and Physical Interval Note:  09/20/2017 1:07 PM  Beverly Andrews  has presented today for surgery, with the diagnosis of chf  The various methods of treatment have been discussed with the patient and family. After consideration of risks, benefits and other options for treatment, the patient has consented to  Procedure(s): RIGHT/LEFT HEART CATH AND CORONARY ANGIOGRAPHY (N/A) as a surgical intervention .  The patient's history has been reviewed, patient examined, no change in status, stable for surgery.  I have reviewed the patient's chart and labs.  Questions were answered to the patient's satisfaction.     Lorine Bears

## 2017-09-21 DIAGNOSIS — I420 Dilated cardiomyopathy: Secondary | ICD-10-CM

## 2017-09-21 LAB — BASIC METABOLIC PANEL
Anion gap: 11 (ref 5–15)
BUN: 17 mg/dL (ref 8–23)
CHLORIDE: 108 mmol/L (ref 98–111)
CO2: 21 mmol/L — AB (ref 22–32)
CREATININE: 0.95 mg/dL (ref 0.44–1.00)
Calcium: 9.2 mg/dL (ref 8.9–10.3)
GFR calc Af Amer: >60 mL/min (ref >60)
GFR calc non Af Amer: >60 mL/min (ref >60)
GLUCOSE: 114 mg/dL — AB (ref 70–99)
POTASSIUM: 3.8 mmol/L (ref 3.5–5.1)
Sodium: 140 mmol/L (ref 135–145)

## 2017-09-21 LAB — CBC
HEMATOCRIT: 44.2 % (ref 36.0–46.0)
Hemoglobin: 14.8 g/dL (ref 12.0–15.0)
MCH: 35.5 pg — AB (ref 26.0–34.0)
MCHC: 33.5 g/dL (ref 30.0–36.0)
MCV: 106 fL — ABNORMAL HIGH (ref 78.0–100.0)
Platelets: 164 10*3/uL (ref 150–400)
RBC: 4.17 MIL/uL (ref 3.87–5.11)
RDW: 13 % (ref 11.5–15.5)
WBC: 7.3 10*3/uL (ref 4.0–10.5)

## 2017-09-21 MED ORDER — RAMELTEON 8 MG PO TABS
8.0000 mg | ORAL_TABLET | Freq: Every day | ORAL | Status: DC
  Administered 2017-09-21 – 2017-09-24 (×4): 8 mg via ORAL
  Filled 2017-09-21 (×5): qty 1

## 2017-09-21 MED ORDER — SACUBITRIL-VALSARTAN 49-51 MG PO TABS
1.0000 | ORAL_TABLET | Freq: Two times a day (BID) | ORAL | Status: DC
  Administered 2017-09-21 (×2): 1 via ORAL
  Filled 2017-09-21 (×3): qty 1

## 2017-09-21 MED ORDER — FUROSEMIDE 10 MG/ML IJ SOLN: 40.0000 mg | Freq: Once | INTRAMUSCULAR | Status: DC

## 2017-09-21 MED ORDER — FUROSEMIDE 10 MG/ML IJ SOLN: 20.0000 mg | Freq: Once | INTRAMUSCULAR | Status: DC

## 2017-09-21 MED FILL — Heparin Sod (Porcine)-NaCl IV Soln 1000 Unit/500ML-0.9%: INTRAVENOUS | Qty: 1000 | Status: AC

## 2017-09-21 NOTE — Progress Notes (Addendum)
Benefit check in progress for Beverly Andrews [facility] [phone]  2:06 pm- Per Benefit check co pay is $47 per month; Entresto coupon card given to patient with explanation of usage; B Shelba Flake [phone]

## 2017-09-21 NOTE — Care Management (Signed)
09-21-17 BENEFITS  CHECK :   # 1.   S/W   JUSTIN  @ [facility] [place] [facility] #  [phone]   ENTRESTO  49-51 MG BID COVER- YES CO-PAY- $  47.00 TIER- NO PRIOR APPROVAL- YES # [id number]  PREFERRED PHARMACY : YES   [facility]

## 2017-09-21 NOTE — Progress Notes (Signed)
Progress Note  Patient Name: Beverly Andrews Date of Encounter: 09/21/2017  Primary Cardiologist: Thurmon Fair, MD   Subjective   Still has a little bit of shortness of breath at rest or with light exertion.  Pulmonary artery wedge pressure was normal yesterday, during sedation.  Inpatient Medications    Scheduled Meds: . atorvastatin  80 mg Oral q1800  . carvedilol  3.125 mg Oral BID WC  . enoxaparin (LOVENOX) injection  40 mg Subcutaneous Q24H  . folic acid  1 mg Oral Daily  . furosemide  40 mg Oral Daily  . multivitamin with minerals  1 tablet Oral Daily  . sacubitril-valsartan  1 tablet Oral BID  . sodium chloride flush  3 mL Intravenous Q12H  . sodium chloride flush  3 mL Intravenous Q12H  . thiamine  100 mg Oral Daily   Or  . thiamine  100 mg Intravenous Daily   Continuous Infusions: . sodium chloride    . sodium chloride     PRN Meds: sodium chloride, sodium chloride, acetaminophen, ipratropium-albuterol, LORazepam **OR** LORazepam, ondansetron (ZOFRAN) IV, sodium chloride flush, sodium chloride flush   Vital Signs    Vitals:   09/20/17 1608 09/20/17 1944 09/20/17 2035 09/21/17 0509  BP: (!) 131/100 110/63 113/74 122/73  Pulse: 80  81 66  Resp:   18 18  Temp:   99.1 F (37.3 C) 98.3 F (36.8 C)  TempSrc:   Oral Oral  SpO2: 98%  94% 100%  Weight:    64.5 kg  Height:        Intake/Output Summary (Last 24 hours) at 09/21/2017 1129 Last data filed at 09/21/2017 0800 Gross per 24 hour  Intake 480 ml  Output 900 ml  Net -420 ml   Filed Weights   09/19/17 1516 09/20/17 0519 09/21/17 0509  Weight: 66.7 kg 63.8 kg 64.5 kg    Telemetry    Sinus rhythm- Personally Reviewed  ECG    New tracing- Personally Reviewed  Physical Exam  Comfortable sitting up, a little restless when lying flat GEN: No acute distress.   Neck: No JVD Cardiac: RRR, no murmurs, rubs, paradoxically split second heart sound Respiratory: Clear to auscultation  bilaterally. GI: Soft, nontender, non-distended  MS: No edema; No deformity. Neuro:  Nonfocal  Psych: Normal affect   Labs    Chemistry Recent Labs  Lab 09/18/17 2135 09/20/17 0413 09/21/17 0443  NA 140 143 140  K 3.7 3.6 3.8  CL 107 104 108  CO2 20* 25 21*  GLUCOSE 97 94 114*  BUN 16 16 17   CREATININE 0.94 1.05* 0.95  CALCIUM 9.3 9.5 9.2  GFRNONAA >60 56* >60  GFRAA >60 >60 >60  ANIONGAP 13 14 11      Hematology Recent Labs  Lab 09/18/17 2135 09/20/17 0413 09/21/17 0443  WBC 6.8 8.6 7.3  RBC 3.95 4.00 4.17  HGB 14.0 14.2 14.8  HCT 40.8 42.0 44.2  MCV 103.3* 105.0* 106.0*  MCH 35.4* 35.5* 35.5*  MCHC 34.3 33.8 33.5  RDW 12.6 13.1 13.0  PLT 164 176 164    Cardiac Enzymes Recent Labs  Lab 09/19/17 1224  TROPONINI 0.06*    Recent Labs  Lab 09/18/17 2154 09/19/17 0320  TROPIPOC 0.04 0.05     BNP Recent Labs  Lab 09/19/17 1224  BNP 1,338.8*     DDimer  Recent Labs  Lab 09/19/17 1340  DDIMER 1.71*     Radiology    No results found.  Cardiac Studies  September 20 2017 cardiac cath  There is severe left ventricular systolic dysfunction.  The left ventricular ejection fraction is less than 25% by visual estimate.  LV end diastolic pressure is mildly elevated.   1.  Normal coronary arteries. 2.  Severely reduced LV systolic function with an EF of 20 to 25% with global hypokinesis. 3.  Right heart catheterization showed high normal filling pressures, normal pulmonary pressure and normal cardiac output.  Pulmonary capillary wedge pressure was 11 mmHg, PA pressure was 30 over 11 mmHg, cardiac output was 5.7 with a cardiac index of 3.2.   Patient Profile     63 y.o. female with newly diagnosed heart failure with severely depressed left ventricular systolic function, nonischemic cardiomyopathy, left bundle branch block with pronounced dyssynchrony  Assessment & Plan    Had a long discussion regarding multiple issues related to heart  failure: -Reviewed the importance of sodium restriction, dietary changes -Discussed daily weight monitoring, to call office if she gains more than 3 pounds overnight or 5 pounds in 1 week. -Complete abstinence from alcohol -Reviewed diuretic dose adjustment depending on weight and symptoms -Started carvedilol and switched today from losartan to [place], discussed plans for gradual titration of these medications to maximum tolerated dose as an outpatient -Repeat echo assessment of left ventricular systolic in roughly 3 months.  If LVEF is still less than 35%, she should receive a CRT-D device.  She has multiple reasons to expect excellent response to CRT including female gender, nonischemic etiology of heart failure, typical left bundle branch block and broad QRS 150 ms.  Anticipate discharge tomorrow if no significant worsening of symptoms or weight gain following transition to oral diuretics.  For questions or updates, please contact [facility] Please consult [email or web address] for contact info under Cardiology/STEMI.      Signed, Thurmon Fair, MD  09/21/2017, 11:29 AM

## 2017-09-22 ENCOUNTER — Telehealth: Payer: Self-pay | Admitting: Cardiology

## 2017-09-22 ENCOUNTER — Inpatient Hospital Stay (HOSPITAL_COMMUNITY): Payer: BC Managed Care – PPO

## 2017-09-22 LAB — CBC
HCT: 45.3 % (ref 36.0–46.0)
HEMOGLOBIN: 15.9 g/dL — AB (ref 12.0–15.0)
MCH: 35.8 pg — AB (ref 26.0–34.0)
MCHC: 35.1 g/dL (ref 30.0–36.0)
MCV: 102 fL — AB (ref 78.0–100.0)
Platelets: 190 10*3/uL (ref 150–400)
RBC: 4.44 MIL/uL (ref 3.87–5.11)
RDW: 12.7 % (ref 11.5–15.5)
WBC: 7.3 10*3/uL (ref 4.0–10.5)

## 2017-09-22 LAB — BASIC METABOLIC PANEL
ANION GAP: 11 (ref 5–15)
BUN: 18 mg/dL (ref 8–23)
CHLORIDE: 105 mmol/L (ref 98–111)
CO2: 22 mmol/L (ref 22–32)
CREATININE: 0.91 mg/dL (ref 0.44–1.00)
Calcium: 9.1 mg/dL (ref 8.9–10.3)
GFR calc non Af Amer: >60 mL/min (ref >60)
Glucose, Bld: 122 mg/dL — ABNORMAL HIGH (ref 70–99)
Potassium: 3.5 mmol/L (ref 3.5–5.1)
Sodium: 138 mmol/L (ref 135–145)

## 2017-09-22 LAB — GLUCOSE, CAPILLARY: Glucose-Capillary: 109 mg/dL — ABNORMAL HIGH (ref 70–99)

## 2017-09-22 MED ORDER — SODIUM CHLORIDE 0.9 % IV BOLUS
250.0000 mL | Freq: Once | INTRAVENOUS | Status: AC
  Administered 2017-09-22: 250 mL via INTRAVENOUS

## 2017-09-22 MED ORDER — FUROSEMIDE 20 MG PO TABS
20.0000 mg | ORAL_TABLET | Freq: Every day | ORAL | Status: DC
  Administered 2017-09-22 – 2017-09-25 (×4): 20 mg via ORAL
  Filled 2017-09-22 (×4): qty 1

## 2017-09-22 MED ORDER — SACUBITRIL-VALSARTAN 24-26 MG PO TABS
1.0000 | ORAL_TABLET | Freq: Two times a day (BID) | ORAL | Status: DC
  Administered 2017-09-22 – 2017-09-23 (×2): 1 via ORAL
  Filled 2017-09-22 (×5): qty 1

## 2017-09-22 NOTE — Progress Notes (Signed)
Progress Note  Patient Name: Beverly Andrews Date of Encounter: 09/22/2017  Primary Cardiologist: Thurmon Fair, MD   Subjective   Near syncope and fall last night due to hypotension when getting off the commode.  Head CT showed no significant injury.  Minor skin injury on her right knee. Feeling better this morning but still mildly hypotensive.  Inpatient Medications    Scheduled Meds: . atorvastatin  80 mg Oral q1800  . carvedilol  3.125 mg Oral BID WC  . enoxaparin (LOVENOX) injection  40 mg Subcutaneous Q24H  . folic acid  1 mg Oral Daily  . furosemide  40 mg Oral Daily  . multivitamin with minerals  1 tablet Oral Daily  . ramelteon  8 mg Oral QHS  . sacubitril-valsartan  1 tablet Oral BID  . sodium chloride flush  3 mL Intravenous Q12H  . sodium chloride flush  3 mL Intravenous Q12H  . thiamine  100 mg Oral Daily   Or  . thiamine  100 mg Intravenous Daily   Continuous Infusions: . sodium chloride    . sodium chloride     PRN Meds: sodium chloride, sodium chloride, acetaminophen, ipratropium-albuterol, LORazepam **OR** LORazepam, ondansetron (ZOFRAN) IV, sodium chloride flush, sodium chloride flush   Vital Signs    Vitals:   09/22/17 0500 09/22/17 0535 09/22/17 0601 09/22/17 0700  BP: 99/67 99/71 95/73  96/79  Pulse: 77 78 78 79  Resp:      Temp:  97.7 F (36.5 C)    TempSrc:  Oral    SpO2: 98% 95% 98% 94%  Weight:  63.4 kg    Height:        Intake/Output Summary (Last 24 hours) at 09/22/2017 0843 Last data filed at 09/22/2017 0810 Gross per 24 hour  Intake 670 ml  Output 2600 ml  Net -1930 ml   Filed Weights   09/20/17 0519 09/21/17 0509 09/22/17 0535  Weight: 63.8 kg 64.5 kg 63.4 kg    Telemetry    Sinus rhythm, no arrhythmia around the time of the fall- Personally Reviewed  ECG    Sinus rhythm left bundle branch block- Personally Reviewed  Physical Exam  Looks calm and comfortable GEN: No acute distress.. No evidence of head trauma  externally, skinned right knee. Neck: No JVD Cardiac: RRR, no murmurs, rubs, or gallops.  Paradoxically split second heart sound Respiratory: Clear to auscultation bilaterally. GI: Soft, nontender, non-distended  MS: No edema; No deformity. Neuro:  Nonfocal  Psych: Normal affect   Labs    Chemistry Recent Labs  Lab 09/20/17 0413 09/21/17 0443 09/22/17 0458  NA 143 140 138  K 3.6 3.8 3.5  CL 104 108 105  CO2 25 21* 22  GLUCOSE 94 114* 122*  BUN 16 17 18   CREATININE 1.05* 0.95 0.91  CALCIUM 9.5 9.2 9.1  GFRNONAA 56* >60 >60  GFRAA >60 >60 >60  ANIONGAP 14 11 11      Hematology Recent Labs  Lab 09/20/17 0413 09/21/17 0443 09/22/17 0458  WBC 8.6 7.3 7.3  RBC 4.00 4.17 4.44  HGB 14.2 14.8 15.9*  HCT 42.0 44.2 45.3  MCV 105.0* 106.0* 102.0*  MCH 35.5* 35.5* 35.8*  MCHC 33.8 33.5 35.1  RDW 13.1 13.0 12.7  PLT 176 164 190    Cardiac Enzymes Recent Labs  Lab 09/19/17 1224  TROPONINI 0.06*    Recent Labs  Lab 09/18/17 2154 09/19/17 0320  TROPIPOC 0.04 0.05     BNP Recent Labs  Lab 09/19/17 1224  BNP 1,338.8*     DDimer  Recent Labs  Lab 09/19/17 1340  DDIMER 1.71*     Radiology    Ct Head Wo Contrast  Result Date: 09/22/2017 CLINICAL DATA:  Headaches EXAM: CT HEAD WITHOUT CONTRAST TECHNIQUE: Contiguous axial images were obtained from the base of the skull through the vertex without intravenous contrast. COMPARISON:  01/12/2012 FINDINGS: Brain: No evidence of acute infarction, hemorrhage, hydrocephalus, extra-axial collection or mass lesion/mass effect. Vascular: No hyperdense vessel or unexpected calcification. Skull: Normal. Negative for fracture or focal lesion. Sinuses/Orbits: No acute finding. Other: None. IMPRESSION: Normal head CT Electronically Signed   By: Alcide Clever M.D.   On: 09/22/2017 03:24    Cardiac Studies   September 20 2017 cardiac cath  There is severe left ventricular systolic dysfunction.  The left ventricular ejection  fraction is less than 25% by visual estimate.  LV end diastolic pressure is mildly elevated.  1. Normal coronary arteries. 2. Severely reduced LV systolic function with an EF of 20 to 25% with global hypokinesis. 3. Right heart catheterization showed high normal filling pressures, normal pulmonary pressure and normal cardiac output. Pulmonary capillary wedge pressure was 11 mmHg, PA pressure was 30 over 11 mmHg, cardiac output was 5.7 with a cardiac index of 3.2.  Echocardiogram 09/19/17: Study Conclusions  - Left ventricle: The cavity size was normal. Wall thickness wasnormal. Systolic function was severely reduced. The estimatedejection fraction was in the range of 20% to 25%. Severe diffusehypokinesis with regional variations. Possible disproportionatehypokinesis of the inferior myocardium. There was fusion of earlyand atrial contributions to ventricular filling. No evidence ofthrombus. - Ventricular septum: Septal motion showed abnormal function,dyssynergy, and paradox. These changes are consistent with a leftbundle branch block. - Mitral valve: There was moderate regurgitation directed centrally.  Patient Profile     62 y.o. female newly diagnosed systolic heart failure with severe left ventricular dysfunction, nonischemic cardiomyopathy, new left bundle branch block, developed hypotension after escalating doses of heart failure medications  Assessment & Plan    1. CHF: We will reduce the dose of her vasodilator.  Appears to be euvolemic and weight is identical to that at the time of her cardiac catheterization when the wedge pressure was 11 mmHg.  Renal function remains normal.  Monitor in the hospital today. 2. LBBB: Left ventricular function is not improved, should be an excellent candidate for CRT-D. 3. Hypotension: reduced the dose of entresto and furosemide.  For questions or updates, please contact [facility] Please consult [email or web address] for contact info  under Cardiology/STEMI.      Signed, Thurmon Fair, MD  09/22/2017, 8:43 AM

## 2017-09-22 NOTE — Progress Notes (Signed)
Pt reported that she fell when the writer went to her room. Pt has been independent and has been going to bathroom alone. At the time of fall, pt's BP was 55/42. MD on call was notified immediately.

## 2017-09-22 NOTE — Telephone Encounter (Signed)
New Message    [facility] appointment made on 10/03/17 at 11:30 with Corine Shelter per Dot Lanes

## 2017-09-22 NOTE — Progress Notes (Signed)
MD on-call was notified at the time of fall. New orders were received and transcribed. CT of the head has been completed. Pt declined for her sister to be notified of the fall.

## 2017-09-22 NOTE — Telephone Encounter (Signed)
Currently admitted.

## 2017-09-22 NOTE — Progress Notes (Signed)
Pt reported fall in the bathroom. She reported hitting her head and knee on the floor. Currently, she is C/O lightheaded and nauseous. MD on call (Cardiologist on call)  has been notified. Neuro checks is in progress. MD verbalized that he will order CT head but not fluid at this time. All vital signs are WNL except the BP. Pt is currently alert and oriented X4, denies loss of consciousness but C/O headache of 4/10 on pain scale. Pt has been educated on fall prevention, she agreed and verbalized understanding.

## 2017-09-22 NOTE — Progress Notes (Signed)
The writer was at the hallway when I heard a noise in the patient room, went in at 0058 and patient was getting in bed, verbalized that she is not feeling well and that she fell in the bathroom, hit her head and knees. The initial Neuro assessment was completed by the the charge nurse and the Clinical research associate. Pt alert and oriented X4. Denies any loss of consciousness. Pt vomited small amount of emesis and reports mild headache. MD on call  has been notified and new orders received. Neuro checks in progress. Will continue to monitoring the patient.

## 2017-09-22 NOTE — Accreditation Note (Signed)
Pt is currently at radiology for CT Scan

## 2017-09-23 DIAGNOSIS — I959 Hypotension, unspecified: Secondary | ICD-10-CM

## 2017-09-23 LAB — CBC
HEMATOCRIT: 49.7 % — AB (ref 36.0–46.0)
Hemoglobin: 16.9 g/dL — ABNORMAL HIGH (ref 12.0–15.0)
MCH: 35.7 pg — ABNORMAL HIGH (ref 26.0–34.0)
MCHC: 34 g/dL (ref 30.0–36.0)
MCV: 104.9 fL — AB (ref 78.0–100.0)
Platelets: 207 10*3/uL (ref 150–400)
RBC: 4.74 MIL/uL (ref 3.87–5.11)
RDW: 13.2 % (ref 11.5–15.5)
WBC: 8.2 10*3/uL (ref 4.0–10.5)

## 2017-09-23 LAB — BASIC METABOLIC PANEL
Anion gap: 10 (ref 5–15)
BUN: 22 mg/dL (ref 8–23)
CHLORIDE: 105 mmol/L (ref 98–111)
CO2: 24 mmol/L (ref 22–32)
Calcium: 9.4 mg/dL (ref 8.9–10.3)
Creatinine, Ser: 1.15 mg/dL — ABNORMAL HIGH (ref 0.44–1.00)
GFR calc Af Amer: 58 mL/min — ABNORMAL LOW (ref >60)
GFR calc non Af Amer: 50 mL/min — ABNORMAL LOW (ref >60)
Glucose, Bld: 103 mg/dL — ABNORMAL HIGH (ref 70–99)
POTASSIUM: 3.5 mmol/L (ref 3.5–5.1)
SODIUM: 139 mmol/L (ref 135–145)

## 2017-09-23 NOTE — Progress Notes (Signed)
Progress Note  Patient Name: Beverly Andrews Date of Encounter: 09/23/2017  Primary Cardiologist: Thurmon Fair, MD   Subjective   She is doing better this morning and has not had any falls or episodes of near syncope.  She has had some mild chest pressure.  She slept with the head of the bed mildly elevated.  She denies leg swelling and paroxysmal nocturnal dyspnea.  Inpatient Medications    Scheduled Meds: . carvedilol  3.125 mg Oral BID WC  . enoxaparin (LOVENOX) injection  40 mg Subcutaneous Q24H  . folic acid  1 mg Oral Daily  . furosemide  20 mg Oral Daily  . multivitamin with minerals  1 tablet Oral Daily  . ramelteon  8 mg Oral QHS  . sacubitril-valsartan  1 tablet Oral BID  . sodium chloride flush  3 mL Intravenous Q12H  . sodium chloride flush  3 mL Intravenous Q12H  . thiamine  100 mg Oral Daily   Or  . thiamine  100 mg Intravenous Daily   Continuous Infusions: . sodium chloride    . sodium chloride     PRN Meds: sodium chloride, sodium chloride, acetaminophen, ipratropium-albuterol, ondansetron (ZOFRAN) IV, sodium chloride flush, sodium chloride flush   Vital Signs    Vitals:   09/22/17 1709 09/22/17 2033 09/23/17 0517 09/23/17 0942  BP: 103/73 (!) 89/55 100/71 94/60  Pulse: 78 80 75 82  Resp:  17 17   Temp:  98.4 F (36.9 C) 98.1 F (36.7 C)   TempSrc:  Oral Oral   SpO2:  97% 98%   Weight:   64 kg   Height:        Intake/Output Summary (Last 24 hours) at 09/23/2017 1044 Last data filed at 09/23/2017 0921 Gross per 24 hour  Intake 480 ml  Output 1100 ml  Net -620 ml   Filed Weights   09/21/17 0509 09/22/17 0535 09/23/17 0517  Weight: 64.5 kg 63.4 kg 64 kg    Telemetry    Sinus rhythm- Personally Reviewed  ECG    NA - Personally Reviewed  Physical Exam   GEN: No acute distress.   Neck: No JVD Cardiac: RRR, paradoxically split second heart sound, no murmurs, rubs, or gallops.  Respiratory: Clear to auscultation bilaterally. GI:  Soft, nontender, non-distended  MS: No edema; No deformity. Neuro:  Nonfocal  Psych: Normal affect   Labs    Chemistry Recent Labs  Lab 09/21/17 0443 09/22/17 0458 09/23/17 0508  NA 140 138 139  K 3.8 3.5 3.5  CL 108 105 105  CO2 21* 22 24  GLUCOSE 114* 122* 103*  BUN 17 18 22   CREATININE 0.95 0.91 1.15*  CALCIUM 9.2 9.1 9.4  GFRNONAA >60 >60 50*  GFRAA >60 >60 58*  ANIONGAP 11 11 10      Hematology Recent Labs  Lab 09/21/17 0443 09/22/17 0458 09/23/17 0508  WBC 7.3 7.3 8.2  RBC 4.17 4.44 4.74  HGB 14.8 15.9* 16.9*  HCT 44.2 45.3 49.7*  MCV 106.0* 102.0* 104.9*  MCH 35.5* 35.8* 35.7*  MCHC 33.5 35.1 34.0  RDW 13.0 12.7 13.2  PLT 164 190 207    Cardiac Enzymes Recent Labs  Lab 09/19/17 1224  TROPONINI 0.06*    Recent Labs  Lab 09/18/17 2154 09/19/17 0320  TROPIPOC 0.04 0.05     BNP Recent Labs  Lab 09/19/17 1224  BNP 1,338.8*     DDimer  Recent Labs  Lab 09/19/17 1340  DDIMER 1.71*  Radiology    Ct Head Wo Contrast  Result Date: 09/22/2017 CLINICAL DATA:  Headaches EXAM: CT HEAD WITHOUT CONTRAST TECHNIQUE: Contiguous axial images were obtained from the base of the skull through the vertex without intravenous contrast. COMPARISON:  01/12/2012 FINDINGS: Brain: No evidence of acute infarction, hemorrhage, hydrocephalus, extra-axial collection or mass lesion/mass effect. Vascular: No hyperdense vessel or unexpected calcification. Skull: Normal. Negative for fracture or focal lesion. Sinuses/Orbits: No acute finding. Other: None. IMPRESSION: Normal head CT Electronically Signed   By: Alcide Clever M.D.   On: 09/22/2017 03:24    Cardiac Studies   August14 2019 cardiac cath  There is severe left ventricular systolic dysfunction.  The left ventricular ejection fraction is less than 25% by visual estimate.  LV end diastolic pressure is mildly elevated.  1. Normal coronary arteries. 2. Severely reduced LV systolic function with an EF  of 20 to 25% with global hypokinesis. 3. Right heart catheterization showed high normal filling pressures, normal pulmonary pressure and normal cardiac output. Pulmonary capillary wedge pressure was 11 mmHg, PA pressure was 30 over 11 mmHg, cardiac output was 5.7 with a cardiac index of 3.2.  Echocardiogram 09/19/17: Study Conclusions  - Left ventricle: The cavity size was normal. Wall thickness wasnormal. Systolic function was severely reduced. The estimatedejection fraction was in the range of 20% to 25%. Severe diffusehypokinesis with regional variations. Possible disproportionatehypokinesis of the inferior myocardium. There was fusion of earlyand atrial contributions to ventricular filling. No evidence ofthrombus. - Ventricular septum: Septal motion showed abnormal function,dyssynergy, and paradox. These changes are consistent with a leftbundle branch block. - Mitral valve: There was moderate regurgitation directed centrally.  Patient Profile     63 y.o. female with newly diagnosed systolic heart failure with severe left ventricular dysfunction, nonischemic cardiomyopathy, new left bundle branch block, developed hypotension after escalating doses of heart failure medications  Assessment & Plan    1.  Nonischemic cardiomyopathy/chronic systolic heart failure: Systolic blood pressure was in the low 90s.  Carvedilol and Entresto have been held this morning.  She appears euvolemic.  Continue Lasix 20 mg daily.  2.  Left bundle branch block: She is a candidate for CRT-D.  3.  Hypotension: Both carvedilol and Entresto were held this morning due to systolic blood pressure readings in the low 90s.  The dose of Entresto and furosemide were reduced yesterday.  I will see if these can be reinstituted tomorrow.  For questions or updates, please contact [facility] Please consult [email or web address] for contact info under Cardiology/STEMI.      Signed, Prentice Docker, MD  09/23/2017,  10:44 AM

## 2017-09-24 DIAGNOSIS — I428 Other cardiomyopathies: Secondary | ICD-10-CM

## 2017-09-24 DIAGNOSIS — Z9889 Other specified postprocedural states: Secondary | ICD-10-CM

## 2017-09-24 HISTORY — DX: Other specified postprocedural states: Z98.890

## 2017-09-24 LAB — BASIC METABOLIC PANEL
ANION GAP: 8 (ref 5–15)
BUN: 20 mg/dL (ref 8–23)
CO2: 25 mmol/L (ref 22–32)
CREATININE: 1.03 mg/dL — AB (ref 0.44–1.00)
Calcium: 9.4 mg/dL (ref 8.9–10.3)
Chloride: 108 mmol/L (ref 98–111)
GFR calc non Af Amer: 57 mL/min — ABNORMAL LOW (ref >60)
Glucose, Bld: 96 mg/dL (ref 70–99)
Potassium: 3.5 mmol/L (ref 3.5–5.1)
SODIUM: 141 mmol/L (ref 135–145)

## 2017-09-24 LAB — CBC
HCT: 45.8 % (ref 36.0–46.0)
HEMOGLOBIN: 15.7 g/dL — AB (ref 12.0–15.0)
MCH: 35.5 pg — ABNORMAL HIGH (ref 26.0–34.0)
MCHC: 34.3 g/dL (ref 30.0–36.0)
MCV: 103.6 fL — AB (ref 78.0–100.0)
PLATELETS: 177 10*3/uL (ref 150–400)
RBC: 4.42 MIL/uL (ref 3.87–5.11)
RDW: 13 % (ref 11.5–15.5)
WBC: 6.8 10*3/uL (ref 4.0–10.5)

## 2017-09-24 MED ORDER — POTASSIUM CHLORIDE CRYS ER 20 MEQ PO TBCR
20.0000 meq | EXTENDED_RELEASE_TABLET | Freq: Every day | ORAL | Status: DC
  Administered 2017-09-24 – 2017-09-25 (×2): 20 meq via ORAL
  Filled 2017-09-24 (×2): qty 1

## 2017-09-24 MED ORDER — METOPROLOL SUCCINATE ER 25 MG PO TB24
12.5000 mg | ORAL_TABLET | Freq: Every day | ORAL | Status: DC
  Administered 2017-09-24: 12.5 mg via ORAL
  Filled 2017-09-24: qty 1

## 2017-09-24 NOTE — Plan of Care (Signed)
  Problem: Education: Goal: Knowledge of General Education information will improve Description Including pain rating scale, medication(s)/side effects and non-pharmacologic comfort measures Outcome: Adequate for Discharge   Problem: Health Behavior/Discharge Planning: Goal: Ability to manage health-related needs will improve Outcome: Adequate for Discharge   

## 2017-09-24 NOTE — Progress Notes (Signed)
Progress Note  Patient Name: Beverly Andrews Date of Encounter: 09/24/2017  Primary Cardiologist: Thurmon Fair, MD   Subjective   Coreg was not given yesterday due to soft blood pressures.  Sherryll Burger was given yesterday evening but not yesterday morning.  She has some occasional mild chest pressure but denies shortness of breath and leg swelling.  She has had some right calf cramping.  Inpatient Medications    Scheduled Meds: . carvedilol  3.125 mg Oral BID WC  . enoxaparin (LOVENOX) injection  40 mg Subcutaneous Q24H  . folic acid  1 mg Oral Daily  . furosemide  20 mg Oral Daily  . multivitamin with minerals  1 tablet Oral Daily  . ramelteon  8 mg Oral QHS  . sacubitril-valsartan  1 tablet Oral BID  . sodium chloride flush  3 mL Intravenous Q12H  . sodium chloride flush  3 mL Intravenous Q12H  . thiamine  100 mg Oral Daily   Or  . thiamine  100 mg Intravenous Daily   Continuous Infusions: . sodium chloride    . sodium chloride     PRN Meds: sodium chloride, sodium chloride, acetaminophen, ipratropium-albuterol, ondansetron (ZOFRAN) IV, sodium chloride flush, sodium chloride flush   Vital Signs    Vitals:   09/23/17 1945 09/23/17 2143 09/24/17 0410 09/24/17 0939  BP: 91/62 103/66 (!) 102/59 (!) 94/58  Pulse: 82 76 71 65  Resp: 18  18   Temp: 99.1 F (37.3 C)  98.5 F (36.9 C)   TempSrc: Oral  Oral   SpO2: 97%  92%   Weight:   65 kg   Height:        Intake/Output Summary (Last 24 hours) at 09/24/2017 0952 Last data filed at 09/24/2017 0626 Gross per 24 hour  Intake 960 ml  Output 2100 ml  Net -1140 ml   Filed Weights   09/22/17 0535 09/23/17 0517 09/24/17 0410  Weight: 63.4 kg 64 kg 65 kg    Telemetry    Sinus rhythm- Personally Reviewed  ECG    No new tracings- Personally Reviewed  Physical Exam   GEN: No acute distress.   Neck: No JVD Cardiac: RRR, paradoxically split second heart sound, no murmurs, rubs, or gallops.  Respiratory: Clear to  auscultation bilaterally. GI: Soft, nontender, non-distended  MS: No edema; No deformity. Neuro:  Nonfocal  Psych: Normal affect   Labs    Chemistry Recent Labs  Lab 09/22/17 0458 09/23/17 0508 09/24/17 0423  NA 138 139 141  K 3.5 3.5 3.5  CL 105 105 108  CO2 22 24 25   GLUCOSE 122* 103* 96  BUN 18 22 20   CREATININE 0.91 1.15* 1.03*  CALCIUM 9.1 9.4 9.4  GFRNONAA >60 50* 57*  GFRAA >60 58* >60  ANIONGAP 11 10 8      Hematology Recent Labs  Lab 09/22/17 0458 09/23/17 0508 09/24/17 0423  WBC 7.3 8.2 6.8  RBC 4.44 4.74 4.42  HGB 15.9* 16.9* 15.7*  HCT 45.3 49.7* 45.8  MCV 102.0* 104.9* 103.6*  MCH 35.8* 35.7* 35.5*  MCHC 35.1 34.0 34.3  RDW 12.7 13.2 13.0  PLT 190 207 177    Cardiac Enzymes Recent Labs  Lab 09/19/17 1224  TROPONINI 0.06*    Recent Labs  Lab 09/18/17 2154 09/19/17 0320  TROPIPOC 0.04 0.05     BNP Recent Labs  Lab 09/19/17 1224  BNP 1,338.8*     DDimer  Recent Labs  Lab 09/19/17 1340  DDIMER 1.71*  Radiology    No results found.  Cardiac Studies   August14 2019 cardiac cath  There is severe left ventricular systolic dysfunction.  The left ventricular ejection fraction is less than 25% by visual estimate.  LV end diastolic pressure is mildly elevated.  1. Normal coronary arteries. 2. Severely reduced LV systolic function with an EF of 20 to 25% with global hypokinesis. 3. Right heart catheterization showed high normal filling pressures, normal pulmonary pressure and normal cardiac output. Pulmonary capillary wedge pressure was 11 mmHg, PA pressure was 30 over 11 mmHg, cardiac output was 5.7 with a cardiac index of 3.2.  Echocardiogram 09/19/17: Study Conclusions  - Left ventricle: The cavity size was normal. Wall thickness wasnormal. Systolic function was severely reduced. The estimatedejection fraction was in the range of 20% to 25%. Severe diffusehypokinesis with regional variations. Possible  disproportionatehypokinesis of the inferior myocardium. There was fusion of earlyand atrial contributions to ventricular filling. No evidence ofthrombus. - Ventricular septum: Septal motion showed abnormal function,dyssynergy, and paradox. These changes are consistent with a leftbundle branch block. - Mitral valve: There was moderate regurgitation directed centrally.  Patient Profile     62 y.o. female with newly diagnosed systolic heart failure with severe left ventricular dysfunction, nonischemic cardiomyopathy, new left bundle branch block, developed hypotension after escalating doses of heart failure medications.  Assessment & Plan    1.  Nonischemic cardiomyopathy/chronic systolic heart failure: Systolic blood pressure remains in the low 90s.  Carvedilol and Entresto have been held this morning.    Carvedilol was held yesterday as well and only the evening dose of Entresto was administered.  She appears euvolemic.  Continue Lasix 20 mg daily.  I will discontinue both carvedilol and Entresto but at the time of discharge I will arrange for early follow-up to see if and when Sherryll Burger can be reinstituted.  I will try low-dose Toprol-XL 12.5 mg daily today.  I will also start potassium chloride 20 mEq daily as K is low normal, 3.5.  2.  Left bundle branch block: She is a candidate for CRT-D.  3.  Hypotension: Both carvedilol and Entresto were held this morning due to systolic blood pressure readings in the low 90s.  Carvedilol was held yesterday as well and only the evening dose of Entresto was administered.  I will discontinue both and try low-dose Toprol-XL 12.5 mg daily.  Disposition: She lives alone and is concerned about going home after switching to a new medication given her low blood pressures.  I think this is reasonable.  If she is stable tomorrow she can be discharged.  I discussed this with both the patient and her nurse.    For questions or updates, please contact [facility] Please consult [email or web address] for contact info under Cardiology/STEMI.      Signed, Prentice Docker, MD  09/24/2017, 9:52 AM

## 2017-09-25 ENCOUNTER — Telehealth (HOSPITAL_COMMUNITY): Payer: Self-pay | Admitting: *Deleted

## 2017-09-25 ENCOUNTER — Encounter (HOSPITAL_COMMUNITY): Payer: Self-pay | Admitting: Cardiology

## 2017-09-25 LAB — CBC
HCT: 44 % (ref 36.0–46.0)
HEMOGLOBIN: 14.8 g/dL (ref 12.0–15.0)
MCH: 35.3 pg — ABNORMAL HIGH (ref 26.0–34.0)
MCHC: 33.6 g/dL (ref 30.0–36.0)
MCV: 105 fL — ABNORMAL HIGH (ref 78.0–100.0)
Platelets: 165 10*3/uL (ref 150–400)
RBC: 4.19 MIL/uL (ref 3.87–5.11)
RDW: 13.1 % (ref 11.5–15.5)
WBC: 5.9 10*3/uL (ref 4.0–10.5)

## 2017-09-25 LAB — VITAMIN B12: VITAMIN B 12: 4328 pg/mL — AB (ref 180–914)

## 2017-09-25 LAB — FERRITIN: Ferritin: 159 ng/mL (ref 11–307)

## 2017-09-25 LAB — TSH: TSH: 2.571 u[IU]/mL (ref 0.350–4.500)

## 2017-09-25 MED ORDER — POTASSIUM CHLORIDE CRYS ER 20 MEQ PO TBCR: 20.0000 meq | EXTENDED_RELEASE_TABLET | Freq: Every day | ORAL | 5 refills | Status: DC

## 2017-09-25 MED ORDER — THIAMINE HCL 100 MG PO TABS: 100.0000 mg | ORAL_TABLET | Freq: Every day | ORAL | 5 refills | Status: DC

## 2017-09-25 MED ORDER — ADULT MULTIVITAMIN W/MINERALS CH
1.0000 | ORAL_TABLET | Freq: Every day | ORAL | 3 refills | Status: DC
Start: 2017-09-26 — End: 2020-11-14

## 2017-09-25 MED ORDER — FUROSEMIDE 20 MG PO TABS: 20.0000 mg | ORAL_TABLET | Freq: Every day | ORAL | 5 refills | Status: DC

## 2017-09-25 MED ORDER — CARVEDILOL 3.125 MG PO TABS
3.1250 mg | ORAL_TABLET | Freq: Two times a day (BID) | ORAL | Status: DC
  Administered 2017-09-25: 3.125 mg via ORAL
  Filled 2017-09-25: qty 1

## 2017-09-25 MED ORDER — CARVEDILOL 3.125 MG PO TABS: 3.1250 mg | ORAL_TABLET | Freq: Two times a day (BID) | ORAL | 11 refills | Status: DC

## 2017-09-25 NOTE — Progress Notes (Signed)
Progress Note  Patient Name: Beverly Andrews Date of Encounter: 09/25/2017  Primary Cardiologist: Thurmon Fair, MD   Subjective   Minimal dyspnea; occasional sharp chest pain  Inpatient Medications    Scheduled Meds: . enoxaparin (LOVENOX) injection  40 mg Subcutaneous Q24H  . folic acid  1 mg Oral Daily  . furosemide  20 mg Oral Daily  . metoprolol succinate  12.5 mg Oral Daily  . multivitamin with minerals  1 tablet Oral Daily  . potassium chloride  20 mEq Oral Daily  . ramelteon  8 mg Oral QHS  . sodium chloride flush  3 mL Intravenous Q12H  . sodium chloride flush  3 mL Intravenous Q12H  . thiamine  100 mg Oral Daily   Or  . thiamine  100 mg Intravenous Daily   Continuous Infusions: . sodium chloride    . sodium chloride     PRN Meds: sodium chloride, sodium chloride, acetaminophen, ipratropium-albuterol, ondansetron (ZOFRAN) IV, sodium chloride flush, sodium chloride flush   Vital Signs    Vitals:   09/24/17 2112 09/25/17 0002 09/25/17 0500 09/25/17 0500  BP: 96/67 113/76  (!) 96/56  Pulse: 67 71  64  Resp: 17 20  20   Temp: 98.5 F (36.9 C) 98.7 F (37.1 C)  98.2 F (36.8 C)  TempSrc: Oral Oral  Oral  SpO2: 96%   100%  Weight:   63.3 kg   Height:        Intake/Output Summary (Last 24 hours) at 09/25/2017 0806 Last data filed at 09/24/2017 1831 Gross per 24 hour  Intake 720 ml  Output 1900 ml  Net -1180 ml   Filed Weights   09/23/17 0517 09/24/17 0410 09/25/17 0500  Weight: 64 kg 65 kg 63.3 kg    Telemetry    Sinus rhythm- Personally Reviewed  Physical Exam   GEN: No acute distress.  WD/WN Neck: No JVD, supple Cardiac: RRR Respiratory: Clear to auscultation bilaterally; no wheeze GI: Soft, NT/ND MS: No edema Neuro:  Grossly intact   Labs    Chemistry Recent Labs  Lab 09/22/17 0458 09/23/17 0508 09/24/17 0423  NA 138 139 141  K 3.5 3.5 3.5  CL 105 105 108  CO2 22 24 25   GLUCOSE 122* 103* 96  BUN 18 22 20   CREATININE 0.91  1.15* 1.03*  CALCIUM 9.1 9.4 9.4  GFRNONAA >60 50* 57*  GFRAA >60 58* >60  ANIONGAP 11 10 8      Hematology Recent Labs  Lab 09/22/17 0458 09/23/17 0508 09/24/17 0423  WBC 7.3 8.2 6.8  RBC 4.44 4.74 4.42  HGB 15.9* 16.9* 15.7*  HCT 45.3 49.7* 45.8  MCV 102.0* 104.9* 103.6*  MCH 35.8* 35.7* 35.5*  MCHC 35.1 34.0 34.3  RDW 12.7 13.2 13.0  PLT 190 207 177    Cardiac Enzymes Recent Labs  Lab 09/19/17 1224  TROPONINI 0.06*    Recent Labs  Lab 09/18/17 2154 09/19/17 0320  TROPIPOC 0.04 0.05     BNP Recent Labs  Lab 09/19/17 1224  BNP 1,338.8*     DDimer  Recent Labs  Lab 09/19/17 1340  DDIMER 1.71*     Cardiac Studies   August14 2019 cardiac cath  There is severe left ventricular systolic dysfunction.  The left ventricular ejection fraction is less than 25% by visual estimate.  LV end diastolic pressure is mildly elevated.  1. Normal coronary arteries. 2. Severely reduced LV systolic function with an EF of 20 to 25% with global hypokinesis.  3. Right heart catheterization showed high normal filling pressures, normal pulmonary pressure and normal cardiac output. Pulmonary capillary wedge pressure was 11 mmHg, PA pressure was 30 over 11 mmHg, cardiac output was 5.7 with a cardiac index of 3.2.  Echocardiogram 09/19/17: Study Conclusions  - Left ventricle: The cavity size was normal. Wall thickness wasnormal. Systolic function was severely reduced. The estimatedejection fraction was in the range of 20% to 25%. Severe diffusehypokinesis with regional variations. Possible disproportionatehypokinesis of the inferior myocardium. There was fusion of earlyand atrial contributions to ventricular filling. No evidence ofthrombus. - Ventricular septum: Septal motion showed abnormal function,dyssynergy, and paradox. These changes are consistent with a leftbundle branch block. - Mitral valve: There was moderate regurgitation  directed centrally.  Patient Profile     62 y.o. female with newly diagnosed systolic heart failure with severe left ventricular dysfunction, nonischemic cardiomyopathy, new left bundle branch block, developed hypotension after escalating doses of heart failure medications.  Assessment & Plan    1.  Nonischemic cardiomyopathy/chronic systolic heart failure: Patient admitted with new onset congestive heart failure.  Catheterization revealed no coronary disease.  Medical therapy has been limited by blood pressure.  I will discontinue Toprol.  Begin carvedilol 3.125 mg twice daily.  I have not added an ACE inhibitor, ARB or Entresto or spironolactone because of low blood pressure.  I will arrange for patient to be seen in the congestive heart failure clinic for medication titration.  Once medications are titrated would plan repeat echocardiogram 3 months later.  If ejection fraction less than 35% would need CRT-D.  Etiology of cardiomyopathy unclear.  Possible contribution from alcohol.  I have asked her to refrain from all alcohol use.  Check TSH and ferritin.  If LV function remains decreased at time of follow-up echocardiogram would likely need cardiac MRI prior to insertion of pacemaker.  We discussed the importance of low-sodium diet and fluid restriction.  I asked her to weigh daily and take an additional 20 mg of Lasix for 1 weight gain of 2 to 3 pounds.    2.  Left bundle branch block: If device required after medication titration would need CRT-D.  3.  Macrocytosis-check B12 and folate.  This will need follow-up as an outpatient.  4.  Alcohol abuse-patient counseled on discontinuing.  Plan discharge today.  Follow-up in CHF clinic for medication titration in 1 to 2 weeks.  Check potassium and renal function at that time.  Follow-up with Dr. Royann Shivers in approximately 3 months.  >30 min PA and physician time D2    For questions or updates, please contact [facility] Please consult  [email or web address] for contact info under Cardiology/STEMI.      Signed, Olga Millers, MD  09/25/2017, 8:06 AM

## 2017-09-25 NOTE — Telephone Encounter (Signed)
Discharge 8/19-first TOC attempt 8/20

## 2017-09-25 NOTE — Telephone Encounter (Signed)
Dr. Jens Som referred patient to our clinic.  Requested for her to be seen in 1-2 weeks for a post hospital f/u.  Per Meredith Staggers, RN patient appt scheduled in our APP Clinic.

## 2017-09-25 NOTE — Progress Notes (Signed)
Reviewed all discharge instructions with patient and paper prescription given for 1 med.  All other meds called into pharmacy and reviewed with patient.  Patient stated understanding of all discharge instructions.  No voiced complaints.

## 2017-09-25 NOTE — Discharge Summary (Signed)
Discharge Summary    Patient ID: Beverly Andrews,  MRN: [id number], DOB/AGE: 06-26-1955 62 y.o.  Admit date: 09/18/2017 Discharge date: 09/25/2017  Primary Care Provider: Deatra James Primary Cardiologist: Thurmon Fair, MD  Discharge Diagnoses    Principal Problem:   DOE (dyspnea on exertion) Active Problems:   Left bundle branch block   Hypertension   Alcohol abuse   Acute systolic heart failure (HCC)   Allergies Allergies  Allergen Reactions  . Shellfish Allergy Swelling  . Other Hives and Swelling    carbocaine  . Penicillins Itching and Swelling    Has patient had a PCN reaction causing immediate rash, facial/tongue/throat swelling, SOB or lightheadedness with hypotension: Yes Has patient had a PCN reaction causing severe rash involving mucus membranes or skin necrosis: No Has patient had a PCN reaction that required hospitalization: No Has patient had a PCN reaction occurring within the last 10 years: Yes If all of the above answers are "NO", then may proceed with Cephalosporin use.     Diagnostic Studies/Procedures    RIGHT/LEFT HEART CATH AND CORONARY ANGIOGRAPHY 09/20/17  Conclusion    There is severe left ventricular systolic dysfunction.  The left ventricular ejection fraction is less than 25% by visual estimate.  LV end diastolic pressure is mildly elevated.   1.  Normal coronary arteries. 2.  Severely reduced LV systolic function with an EF of 20 to 25% with global hypokinesis. 3.  Right heart catheterization showed high normal filling pressures, normal pulmonary pressure and normal cardiac output.  Pulmonary capillary wedge pressure was 11 mmHg, PA pressure was 30 over 11 mmHg, cardiac output was 5.7 with a cardiac index of 3.2.  Recommendations: The patient has nonischemic cardiomyopathy.  Recommend medical therapy.  I switch furosemide to by mouth 40 mg once daily starting tomorrow and added small dose carvedilol.  Consider Entresto if blood  pressure allows and spironolactone. If EF does not improve after 3 months on optimal medical therapy, CRT should be considered given underlying left bundle branch block.      Echocardiogram 09/19/2017 Study Conclusions - Left ventricle: The cavity size was normal. Wall thickness was   normal. Systolic function was severely reduced. The estimated   ejection fraction was in the range of 20% to 25%. Severe diffuse   hypokinesis with regional variations. Possible disproportionate   hypokinesis of the inferior myocardium. There was fusion of early   and atrial contributions to ventricular filling. No evidence of   thrombus. - Ventricular septum: Septal motion showed abnormal function,   dyssynergy, and paradox. These changes are consistent with a left   bundle branch block. - Mitral valve: There was moderate regurgitation directed   centrally. _____________   History of Present Illness     Beverly Andrews is a 62 y.o. female with a history of HTN, gout,and recurrent PNA who follows at [facility], seen for the evaluation of new LBBB and DOE. Ms.Marshallhas been feeling poorly since she was diagnosed with PNA 2 months ago.She completed 2 courses of antibiotics (most recently 3 weeks ago) and reported some improvement in symptoms. On Thursday/Friday of last week, she noted worsening DOE and non-productive coughing. She was worried about recurrent PNA [facility] the same complaints of SOB/DOE, coughing,and fatigue. EKG at the time revealed LBBB. Per patient, her last EKG prior to this one was >10 years ago and reportedly normal. She was without CP complaints at that time.She received an email from  someone at [facility] Monday 8/12/19instructing her to present to the ED for evaluation. ED RN received a call from [facility] that her BNP vs Pro-BNP was 644 and that result prompted the email suggesting presentation to the ED.    She reports prior history of a heart murmur but has never had an echocardiogram. Also with HTN recently off of her medication for one month, restarted 08/2017. Denies prior cardiac disease and has never had an ischemic evaluation. She is a former smoker who quit 6 months ago. She reports heavy ETOH use, drinking ~3 cocktails per day. She denied prior history of ETOH withdrawal/DTs. She denied family history of CAD or CHF; father had aortic valve replacement and AAA.  Initial evaluation she reported having significant DOE which worsening last Thursday/Friday as well as orthopnea but no PND or LE edema.    Hospital Course     Consultants: None  BNP was 1338. CXR without edema.   Echocardiogram on 09/19/2017 showed severely reduced LV systolic function with EF 20-25%, severe diffuse hypokinesis with regional variations, possible disproportionate hypokinesis of the inferior myocardium, moderate mitral regurgitation.  She was intermittently tachycardic and was persistently hypertensive.  Ms Balian underwent right and left heart cardiac catheterization on 09/20/2017 that showed severe LV systolic dysfunction with EF less than 25% by visual estimate.  LV end-diastolic pressure was mildly elevated.  Coronary arteries were normal.  Patient was diagnosed with nonischemic cardiomyopathy with recommendation for medical therapy.  Toprol was initiated and subsequently changed to carvedilol 3.125 mg twice daily.  Her home losartan was discontinued due to low blood pressure.  We were unable to initiate ACE inhibitor, ARB or Entresto or spironolactone due to low blood pressure.  We have arranged for follow-up in the advanced heart failure clinic on August 29 for titration of her medications.  Once medication titration achieved, would repeat echocardiogram 3 months later.  If EF less than 35% would need CRT-D.  Patient does have a left bundle branch block.  She was given Lasix 40 mg IV for 1 dose followed by 20  mg p.o. Daily. Potassium was low normal, 3.5. She will be continued on KDur daily.   Etiology of cardiomyopathy is unclear.  There is possible contribution from alcohol use.  She has been asked to refrain from all alcohol.  If LV function remains decreased at the time of follow-up echocardiogram, would likely need cardiac MRI prior to insertion of a pacemaker.  Low-sodium diet and fluid restriction have been discussed with the patient.  He has been asked to weigh herself daily and take an additional 20 mg of Lasix for weight gain of 2-3 pounds in a day.  She was given multivitamin, folic acid and thiamine supplementation while hospitalized.  Vitamin B12 was checked and it was elevated at 4328.  Ferritin was 159, normal.  TSH was within normal range at 2.571.  Patient has been seen by Dr. Jens Som today and deemed ready for discharge home. All follow up appointments have been scheduled. Discharge medications are listed below. _____________  Discharge Vitals Blood pressure 103/62, pulse 68, temperature 98.2 F (36.8 C), temperature source Oral, resp. rate 20, height 5\' 7"  (1.702 m), weight 63.3 kg, SpO2 100 %.  Filed Weights   09/23/17 0517 09/24/17 0410 09/25/17 0500  Weight: 64 kg 65 kg 63.3 kg    Labs & Radiologic Studies    CBC Recent Labs    09/24/17 0423 09/25/17 0600  WBC 6.8 5.9  HGB 15.7* 14.8  HCT 45.8 44.0  MCV 103.6* 105.0*  PLT 177 165   Basic Metabolic Panel Recent Labs    13/08/65 0508 09/24/17 0423  NA 139 141  K 3.5 3.5  CL 105 108  CO2 24 25  GLUCOSE 103* 96  BUN 22 20  CREATININE 1.15* 1.03*  CALCIUM 9.4 9.4   Liver Function Tests No results for input(s): AST, ALT, ALKPHOS, BILITOT, PROT, ALBUMIN in the last 72 hours. No results for input(s): LIPASE, AMYLASE in the last 72 hours. Cardiac Enzymes No results for input(s): CKTOTAL, CKMB, CKMBINDEX, TROPONINI in the last 72 hours. BNP Invalid input(s): POCBNP D-Dimer No results for input(s):  DDIMER in the last 72 hours. Hemoglobin A1C No results for input(s): HGBA1C in the last 72 hours. Fasting Lipid Panel No results for input(s): CHOL, HDL, LDLCALC, TRIG, CHOLHDL, LDLDIRECT in the last 72 hours. Thyroid Function Tests Recent Labs    09/25/17 0851  TSH 2.571   _____________  Dg Chest 2 View  Result Date: 09/18/2017 CLINICAL DATA:  Chest pain and shortness of breath. Recent diagnosis of pneumonia for 2 days. EXAM: CHEST - 2 VIEW COMPARISON:  07/21/2017 FINDINGS: Normal heart size and pulmonary vascularity. No focal airspace disease or consolidation in the lungs. No blunting of costophrenic angles. No pneumothorax. Mediastinal contours appear intact. Mild degenerative changes in the spine. IMPRESSION: No active cardiopulmonary disease. Electronically Signed   By: Burman Nieves M.D.   On: 09/18/2017 22:16   Ct Head Wo Contrast  Result Date: 09/22/2017 CLINICAL DATA:  Headaches EXAM: CT HEAD WITHOUT CONTRAST TECHNIQUE: Contiguous axial images were obtained from the base of the skull through the vertex without intravenous contrast. COMPARISON:  01/12/2012 FINDINGS: Brain: No evidence of acute infarction, hemorrhage, hydrocephalus, extra-axial collection or mass lesion/mass effect. Vascular: No hyperdense vessel or unexpected calcification. Skull: Normal. Negative for fracture or focal lesion. Sinuses/Orbits: No acute finding. Other: None. IMPRESSION: Normal head CT Electronically Signed   By: Alcide Clever M.D.   On: 09/22/2017 03:24   Disposition   Pt is being discharged home today in good condition.  Follow-up Plans & Appointments    Follow-up Information    Abelino Derrick, PA-C Follow up on 10/03/2017.   Specialties:  Cardiology, Radiology Why:  Please arrive 15 minutes early for your 11:30am post-hospital cardiology follow-up appointment Contact information: [address] [place] [address] [phone]        [facility] Follow up.   Specialty:  Cardiology Why:  Appointment at the advanced heart failure clinic on Thursday August 29 at 1030. Please call the office number for directions on parking.  The parking code for August is 1500.  Contact information: [address] [id number] mc [place] [address] [phone]         Discharge Instructions    (HEART FAILURE PATIENTS) Call MD:  Anytime you have any of the following symptoms: 1) 3 pound weight gain in 24 hours or 5 pounds in 1 week 2) shortness of breath, with or without a dry hacking cough 3) swelling in the hands, feet or stomach 4) if you have to sleep on extra pillows at night in order to breathe.   Complete by:  As directed    Diet - low sodium heart healthy   Complete by:  As directed    Increase activity slowly   Complete by:  As directed       Discharge Medications   Allergies  as of 09/25/2017      Reactions   Shellfish Allergy Swelling   Other Hives, Swelling   carbocaine   Penicillins Itching, Swelling   Has patient had a PCN reaction causing immediate rash, facial/tongue/throat swelling, SOB or lightheadedness with hypotension: Yes Has patient had a PCN reaction causing severe rash involving mucus membranes or skin necrosis: No Has patient had a PCN reaction that required hospitalization: No Has patient had a PCN reaction occurring within the last 10 years: Yes If all of the above answers are "NO", then may proceed with Cephalosporin use.      Medication List    STOP taking these medications   losartan 50 MG tablet Commonly known as:  COZAAR     TAKE these medications   albuterol 108 (90 Base) MCG/ACT inhaler Commonly known as:  PROVENTIL HFA;VENTOLIN HFA Inhale 1-2 puffs into the lungs every 6 (six) hours as needed for wheezing or shortness of breath.   Biotin 10 MG Tabs Take 10 mg by mouth daily.   carvedilol 3.125 MG tablet Commonly known as:  COREG Take 1 tablet (3.125 mg  total) by mouth 2 (two) times daily with a meal.   cholecalciferol 1000 units tablet Commonly known as:  VITAMIN D Take 1,000 Units by mouth daily.   colchicine 0.6 MG tablet Take 0.6 mg by mouth daily as needed (gout flare up).   Collagen 500 MG Caps Take 500 mg by mouth daily.   furosemide 20 MG tablet Commonly known as:  LASIX Take 1 tablet (20 mg total) by mouth daily. Take an additional tablet if weight increases by 2-3 pounds. Start taking on:  09/26/2017   multivitamin with minerals Tabs tablet Take 1 tablet by mouth daily. Start taking on:  09/26/2017   potassium chloride SA 20 MEQ tablet Commonly known as:  K-DUR,KLOR-CON Take 1 tablet (20 mEq total) by mouth daily. Start taking on:  09/26/2017   thiamine 100 MG tablet Take 1 tablet (100 mg total) by mouth daily. Start taking on:  09/26/2017   vitamin C 500 MG tablet Commonly known as:  ASCORBIC ACID Take 500 mg by mouth daily.        Acute coronary syndrome (MI, NSTEMI, STEMI, etc) this admission?:  No.  The elevated Troponin was due to the acute medical illness or demand ischemia.    Outstanding Labs/Studies   BMet- at follow up on 8/29 to assess potassium and renal function  Duration of Discharge Encounter   Greater than 30 minutes including physician time.  Signed, Berton Bon, NP 09/25/2017, 2:39 PM

## 2017-09-26 LAB — FOLATE RBC
FOLATE, HEMOLYSATE: 393.1 ng/mL
FOLATE, RBC: 923 ng/mL (ref >498)
HEMATOCRIT: 42.6 % (ref 34.0–46.6)

## 2017-09-26 NOTE — Telephone Encounter (Signed)
Patient contacted regarding discharge from 09/18/2017 - 09/25/2017 (7 days) [facility]  Patient understands to follow up with provider YES on [phone] at 1130AM at [facility] OFFICE. Patient understands discharge instructions? YES Patient understands medications and regiment? YES Patient understands to bring all medications to this visit? YES

## 2017-10-03 ENCOUNTER — Encounter: Payer: Self-pay | Admitting: Cardiology

## 2017-10-03 ENCOUNTER — Ambulatory Visit: Payer: BC Managed Care – PPO | Admitting: Cardiology

## 2017-10-03 VITALS — BP 122/60 | Ht 67.0 in | Wt 143.2 lb

## 2017-10-03 DIAGNOSIS — I5021 Acute systolic (congestive) heart failure: Secondary | ICD-10-CM

## 2017-10-03 DIAGNOSIS — F1010 Alcohol abuse, uncomplicated: Secondary | ICD-10-CM

## 2017-10-03 DIAGNOSIS — I428 Other cardiomyopathies: Secondary | ICD-10-CM | POA: Diagnosis not present

## 2017-10-03 DIAGNOSIS — Z0389 Encounter for observation for other suspected diseases and conditions ruled out: Secondary | ICD-10-CM

## 2017-10-03 DIAGNOSIS — I447 Left bundle-branch block, unspecified: Secondary | ICD-10-CM | POA: Diagnosis not present

## 2017-10-03 DIAGNOSIS — IMO0001 Reserved for inherently not codable concepts without codable children: Secondary | ICD-10-CM

## 2017-10-03 NOTE — Assessment & Plan Note (Signed)
Cath 09/20/17

## 2017-10-03 NOTE — Patient Instructions (Addendum)
No changes at present time WITH MEDICATIONS   KEEP YOUR APPOINTMENT WITH HEART FAILURE CLINIC - AUG [phone]     Your physician recommends that you schedule a follow-up appointment in 3 MONTHS WITH DR CROITORU. ( NOV 07,1219 AT 9:40 AM)

## 2017-10-03 NOTE — Assessment & Plan Note (Signed)
May be a candidate for a CRT device if she doesn't respond to medical Rx

## 2017-10-03 NOTE — Progress Notes (Signed)
10/03/2017 Beverly Andrews   [birth year]  [id number]  Primary Physician Deatra James, MD Primary Cardiologist: Dr Royann Shivers  HPI:  Pleasant 62 y/o female, works at the [employer] as a Conservation officer, nature, presented 09/19/17 with DOE and new LBBB. She was found to have a cardiomyopathy- EF 20-25% with global HK. Cath 09/20/17 showed normal coronaries and confirmed her EF was 20-25%. She diuresed approximately 6 lbs with improvement in her dyspnea. The plan is for medical Rx and f/u echo in 3 months. If her EF doesn't improve she may be a good candidate for a CRT device. She was tried on Entresto in the hospital and had low B/P (she actually had near syncope in the bathroom in the hospital after one dose).  Dr Royann Shivers wanted her established with the CHF clinic and she was given an appointment to be seen there 10/05/17. She is her to see me as a TOC f/u.   Since discharge she has done well, no near syncope, no unusual dyspnea. She is watching her sodium intake and has abstained from ETOH.    Current Outpatient Medications  Medication Sig Dispense Refill  . albuterol (PROVENTIL HFA;VENTOLIN HFA) 108 (90 Base) MCG/ACT inhaler Inhale 1-2 puffs into the lungs every 6 (six) hours as needed for wheezing or shortness of breath.    . Biotin 10 MG TABS Take 10 mg by mouth daily.     . carvedilol (COREG) 3.125 MG tablet Take 1 tablet (3.125 mg total) by mouth 2 (two) times daily with a meal. 60 tablet 11  . cholecalciferol (VITAMIN D) 1000 units tablet Take 1,000 Units by mouth daily.    . colchicine 0.6 MG tablet Take 0.6 mg by mouth daily as needed (gout flare up).    . Collagen 500 MG CAPS Take 500 mg by mouth daily.     . furosemide (LASIX) 20 MG tablet Take 1 tablet (20 mg total) by mouth daily. Take an additional tablet if weight increases by 2-3 pounds. 30 tablet 5  . Multiple Vitamin (MULTIVITAMIN WITH MINERALS) TABS tablet Take 1 tablet by mouth daily. 90 tablet 3  . potassium chloride SA (K-DUR,KLOR-CON) 20 MEQ  tablet Take 1 tablet (20 mEq total) by mouth daily. 30 tablet 5  . thiamine 100 MG tablet Take 1 tablet (100 mg total) by mouth daily. 30 tablet 5  . vitamin C (ASCORBIC ACID) 500 MG tablet Take 500 mg by mouth daily.     No current facility-administered medications for this visit.     Allergies  Allergen Reactions  . Shellfish Allergy Swelling  . Other Hives and Swelling    carbocaine  . Penicillins Itching and Swelling    Has patient had a PCN reaction causing immediate rash, facial/tongue/throat swelling, SOB or lightheadedness with hypotension: Yes Has patient had a PCN reaction causing severe rash involving mucus membranes or skin necrosis: No Has patient had a PCN reaction that required hospitalization: No Has patient had a PCN reaction occurring within the last 10 years: Yes If all of the above answers are "NO", then may proceed with Cephalosporin use.     Past Medical History:  Diagnosis Date  . Alcohol abuse   . Gout   . History of cardiac cath 09/24/2017   09/24/17: normal coronary arteries, EF <25%  . Hypertension   . Non-ischemic cardiomyopathy (HCC) 09/18/2017   09/18/17- EF 20-25%  . Recurrent pneumonia     Social History   Socioeconomic History  . Marital status: Divorced  Spouse name: Not on file  . Number of children: Not on file  . Years of education: Not on file  . Highest education level: Not on file  Occupational History  . Occupation: Works at an [employer]  . Financial resource strain: Not on file  . Food insecurity:    Worry: Not on file    Inability: Not on file  . Transportation needs:    Medical: Not on file    Non-medical: Not on file  Tobacco Use  . Smoking status: Former Games developer  . Smokeless tobacco: Never Used  . Tobacco comment: quit 6 months ago  Substance and Sexual Activity  . Alcohol use: Yes    Alcohol/week: 21.0 standard drinks    Types: 21 Shots of liquor per week  . Drug use: Never  . Sexual activity: Not  on file  Lifestyle  . Physical activity:    Days per week: Not on file    Minutes per session: Not on file  . Stress: Not on file  Relationships  . Social connections:    Talks on phone: Not on file    Gets together: Not on file    Attends religious service: Not on file    Active member of club or organization: Not on file    Attends meetings of clubs or organizations: Not on file    Relationship status: Not on file  . Intimate partner violence:    Fear of current or ex partner: Not on file    Emotionally abused: Not on file    Physically abused: Not on file    Forced sexual activity: Not on file  Other Topics Concern  . Not on file  Social History Narrative  . Not on file     Family History  Problem Relation Age of Onset  . AAA (abdominal aortic aneurysm) Father   . Aortic stenosis Father      Review of Systems: General: negative for chills, fever, night sweats or weight changes.  Cardiovascular: negative for chest pain, dyspnea on exertion, edema, orthopnea, palpitations, paroxysmal nocturnal dyspnea or shortness of breath Dermatological: negative for rash Respiratory: negative for cough or wheezing Urologic: negative for hematuria Abdominal: negative for nausea, vomiting, diarrhea, bright red blood per rectum, melena, or hematemesis Neurologic: negative for visual changes, syncope, or dizziness All other systems reviewed and are otherwise negative except as noted above.    Blood pressure 122/60, height 5\' 7"  (1.702 m), weight 143 lb 3.2 oz (65 kg).  General appearance: alert, cooperative and no distress Neck: no carotid bruit and no JVD Lungs: clear to auscultation bilaterally Heart: regular rate and rhythm Extremities: no edema Skin: Skin color, texture, turgor normal. No rashes or lesions Neurologic: Grossly normal  EKG NSR, LBBB  ASSESSMENT AND PLAN:   Acute systolic heart failure (HCC) Admitted 09/19/17- discharged 09/25/17 Stable currently- wgt 143  lbs  Non-ischemic cardiomyopathy (HCC) EF 20-25% by echo  Left bundle branch block May be a candidate for a CRT device if she doesn't respond to medical Rx  Alcohol abuse She has abstained since discharge  Normal coronary arteries Cath 09/20/17   PLAN  Repeat B/P by me was 90/58. I don't feel comfortable adding any new medications. She has an appointment 10/05/17 in the [facility]. To simplify care I will defer medication titration (if possible) and ordering a f/u echo in 3 months to the CHF clinic. I'll arrange for the patient to be seen by Dr Royann Shivers  after the echo.   Corine Shelter PA-C 10/03/2017 12:02 PM

## 2017-10-03 NOTE — Assessment & Plan Note (Signed)
Admitted 09/19/17- discharged 09/25/17 Stable currently- wgt 143 lbs

## 2017-10-03 NOTE — Assessment & Plan Note (Signed)
She has abstained since discharge

## 2017-10-03 NOTE — Assessment & Plan Note (Signed)
EF 20-25% by echo  

## 2017-10-05 ENCOUNTER — Ambulatory Visit (HOSPITAL_COMMUNITY)
Admission: RE | Admit: 2017-10-05 | Discharge: 2017-10-05 | Disposition: A | Discharge status: Home / Self Care | Payer: BC Managed Care – PPO | Source: Ambulatory Visit | Attending: Internal Medicine | Admitting: Internal Medicine

## 2017-10-05 VITALS — BP 122/74 | HR 75 | Wt 146.0 lb

## 2017-10-05 DIAGNOSIS — I110 Hypertensive heart disease with heart failure: Secondary | ICD-10-CM | POA: Diagnosis not present

## 2017-10-05 DIAGNOSIS — Z8249 Family history of ischemic heart disease and other diseases of the circulatory system: Secondary | ICD-10-CM | POA: Diagnosis not present

## 2017-10-05 DIAGNOSIS — Z8701 Personal history of pneumonia (recurrent): Secondary | ICD-10-CM | POA: Insufficient documentation

## 2017-10-05 DIAGNOSIS — I447 Left bundle-branch block, unspecified: Secondary | ICD-10-CM | POA: Diagnosis not present

## 2017-10-05 DIAGNOSIS — I428 Other cardiomyopathies: Secondary | ICD-10-CM | POA: Diagnosis not present

## 2017-10-05 DIAGNOSIS — I5022 Chronic systolic (congestive) heart failure: Secondary | ICD-10-CM | POA: Diagnosis present

## 2017-10-05 DIAGNOSIS — Z79899 Other long term (current) drug therapy: Secondary | ICD-10-CM | POA: Insufficient documentation

## 2017-10-05 DIAGNOSIS — Z880 Allergy status to penicillin: Secondary | ICD-10-CM | POA: Diagnosis not present

## 2017-10-05 DIAGNOSIS — Z87891 Personal history of nicotine dependence: Secondary | ICD-10-CM | POA: Diagnosis not present

## 2017-10-05 DIAGNOSIS — Z0389 Encounter for observation for other suspected diseases and conditions ruled out: Secondary | ICD-10-CM | POA: Diagnosis not present

## 2017-10-05 DIAGNOSIS — M109 Gout, unspecified: Secondary | ICD-10-CM | POA: Insufficient documentation

## 2017-10-05 DIAGNOSIS — Z91013 Allergy to seafood: Secondary | ICD-10-CM | POA: Insufficient documentation

## 2017-10-05 DIAGNOSIS — F1010 Alcohol abuse, uncomplicated: Secondary | ICD-10-CM | POA: Diagnosis not present

## 2017-10-05 DIAGNOSIS — IMO0001 Reserved for inherently not codable concepts without codable children: Secondary | ICD-10-CM

## 2017-10-05 MED ORDER — SPIRONOLACTONE 25 MG PO TABS: 12.5000 mg | ORAL_TABLET | Freq: Every day | ORAL | 3 refills | Status: DC

## 2017-10-05 NOTE — Patient Instructions (Signed)
START Spironolactone 12.5 mg (1/2 tablet) once daily.  Will schedule you for Cardiac MRI at [facility]. Radiology department will call you to schedule.  Follow up 2 weeks with CHF clinical pharmacist Elizabeth Palau.  __________________________________________________________________ [place] Code: 1600  Follow up 6 weeks with Otilio Saber PA-C.  __________________________________________________________________ [place] Code: 1700  Take all medication as prescribed the day of your appointment. Bring all medications with you to your appointment.  Do the following things EVERYDAY: 1) Weigh yourself in the morning before breakfast. Write it down and keep it in a log. 2) Take your medicines as prescribed 3) Eat low salt foods-Limit salt (sodium) to 2000 mg per day.  4) Stay as active as you can everyday 5) Limit all fluids for the day to less than 2 liters

## 2017-10-05 NOTE — Progress Notes (Signed)
Advanced Heart Failure Clinic Note   Referring Physician: PCP: Deatra James, MD PCP-Cardiologist: Thurmon Fair, MD   HPI:  Beverly Andrews is a 62 y.o. female with chronic systolic CHF, NICM, new LBBB, and hypotension limiting med titration.   Seen in hospital 09/18/17 with DOE and new LBBB. Echo with new systolic CHF as below. Cath with normal coronaries. Planned for med optimization and possible CRT device if EF does not improve. Referred to see CHF clinic with hypotension limiting med titration.   She presents today to establish in the HF clinic as above. She has felt somewhat better since recent hospitalization, but she remains very fatigued. 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 She denies any SOB, she is mostly limited by fatigue. She denies any peripheral edema or abdominal distention. She sleeps on her side or on 2 pillows, and does have occasional orthopnea, + bendopnea at work.  She does not smoke. She has had no ETOH since leaving the hospital. Of note, she was treated for PNA in June of this year, and feels she never really recovered. She was in her usually state of Health in April/May. She does c/o a dry cough since leaving the hospital, and intermittent low back pain.   EKG today NSR 69 bpm with QRS 156 ms.   Echo 09/19/17 LVEF 20-25%, LBBB, Mod central MR.   [facility] 09/20/17 - Normal coronary arteries Hemodynamics (mmHg) RA mean 2 RV 31/0 PA 30/11 PCWP 11 AO 111/70 Cardiac Output (Fick) 5.72 Cardiac Index (Fick) 3.28  Review of Systems: [y] = yes, [ ]  = no   . General: Weight gain [ ] ; Weight loss [ ] ; Anorexia [ ] ; Fatigue [y]; Fever [ ] ; Chills [ ] ; Weakness [y]  . Cardiac: Chest pain/pressure [ ] ; Resting SOB [ ] ; Exertional SOB [ ] ; Orthopnea [y]; Pedal Edema [ ] ; Palpitations [ ] ; Syncope [ ] ; Presyncope [ ] ; Paroxysmal nocturnal dyspnea[ ]   . Pulmonary: Cough [y]; Wheezing[ ] ; Hemoptysis[ ] ; Sputum [ ] ;  Snoring [ ]   . GI: Vomiting[ ] ; Dysphagia[ ] ; Melena[ ] ; Hematochezia [ ] ; Heartburn[ ] ; Abdominal pain [ ] ; Constipation [ ] ; Diarrhea [ ] ; BRBPR [ ]   . GU: Hematuria[ ] ; Dysuria [ ] ; Nocturia[ ]   . Vascular: Pain in legs with walking [ ] ; Pain in feet with lying flat [ ] ; Non-healing sores [ ] ; Stroke [ ] ; TIA [ ] ; Slurred speech [ ] ;  . Neuro: Headaches[ ] ; Vertigo[ ] ; Seizures[ ] ; Paresthesias[ ] ;Blurred vision [ ] ; Diplopia [ ] ; Vision changes [ ]   . Ortho/Skin: Arthritis [y]; Joint pain [y]; Muscle pain [ ] ; Joint swelling [ ] ; Back Pain [ ] ; Rash [ ]   . Psych: Depression[ ] ; Anxiety[ ]   . Heme: Bleeding problems [ ] ; Clotting disorders [ ] ; Anemia [ ]   . Endocrine: Diabetes [ ] ; Thyroid dysfunction[ ]    Past Medical History:  Diagnosis Date  . Alcohol abuse   . Gout   . History of cardiac cath 09/24/2017   09/24/17: normal coronary arteries, EF <25%  . Hypertension   . Non-ischemic cardiomyopathy (HCC) 09/18/2017   09/18/17- EF 20-25%  . Recurrent pneumonia     Current Outpatient Medications  Medication Sig Dispense Refill  . albuterol (PROVENTIL HFA;VENTOLIN HFA) 108 (90 Base) MCG/ACT inhaler Inhale 1-2 puffs into the lungs every 6 (six) hours as needed for wheezing or shortness of breath.    . Biotin 10 MG  TABS Take 10 mg by mouth daily.     . carvedilol (COREG) 3.125 MG tablet Take 1 tablet (3.125 mg total) by mouth 2 (two) times daily with a meal. 60 tablet 11  . cholecalciferol (VITAMIN D) 1000 units tablet Take 1,000 Units by mouth daily.    . colchicine 0.6 MG tablet Take 0.6 mg by mouth daily as needed (gout flare up).    . Collagen 500 MG CAPS Take 500 mg by mouth daily.     . furosemide (LASIX) 20 MG tablet Take 1 tablet (20 mg total) by mouth daily. Take an additional tablet if weight increases by 2-3 pounds. 30 tablet 5  . Multiple Vitamin (MULTIVITAMIN WITH MINERALS) TABS tablet Take 1 tablet by mouth daily. 90 tablet 3  . potassium chloride SA (K-DUR,KLOR-CON) 20  MEQ tablet Take 1 tablet (20 mEq total) by mouth daily. 30 tablet 5  . thiamine 100 MG tablet Take 1 tablet (100 mg total) by mouth daily. 30 tablet 5  . vitamin C (ASCORBIC ACID) 500 MG tablet Take 500 mg by mouth daily.     No current facility-administered medications for this encounter.     Allergies  Allergen Reactions  . Shellfish Allergy Swelling  . Other Hives and Swelling    carbocaine  . Penicillins Itching and Swelling    Has patient had a PCN reaction causing immediate rash, facial/tongue/throat swelling, SOB or lightheadedness with hypotension: Yes Has patient had a PCN reaction causing severe rash involving mucus membranes or skin necrosis: No Has patient had a PCN reaction that required hospitalization: No Has patient had a PCN reaction occurring within the last 10 years: Yes If all of the above answers are "NO", then may proceed with Cephalosporin use.       Social History   Socioeconomic History  . Marital status: Divorced    Spouse name: Not on file  . Number of children: Not on file  . Years of education: Not on file  . Highest education level: Not on file  Occupational History  . Occupation: Works at an [employer]  . Financial resource strain: Not on file  . Food insecurity:    Worry: Not on file    Inability: Not on file  . Transportation needs:    Medical: Not on file    Non-medical: Not on file  Tobacco Use  . Smoking status: Former Games developer  . Smokeless tobacco: Never Used  . Tobacco comment: quit 6 months ago  Substance and Sexual Activity  . Alcohol use: Yes    Alcohol/week: 21.0 standard drinks    Types: 21 Shots of liquor per week  . Drug use: Never  . Sexual activity: Not on file  Lifestyle  . Physical activity:    Days per week: Not on file    Minutes per session: Not on file  . Stress: Not on file  Relationships  . Social connections:    Talks on phone: Not on file    Gets together: Not on file    Attends religious  service: Not on file    Active member of club or organization: Not on file    Attends meetings of clubs or organizations: Not on file    Relationship status: Not on file  . Intimate partner violence:    Fear of current or ex partner: Not on file    Emotionally abused: Not on file    Physically abused: Not on file  Forced sexual activity: Not on file  Other Topics Concern  . Not on file  Social History Narrative  . Not on file      Family History  Problem Relation Age of Onset  . AAA (abdominal aortic aneurysm) Father   . Aortic stenosis Father     Vitals:   10/05/17 1115  BP: 122/74  Pulse: 75  SpO2: 100%  Weight: 66.2 kg (146 lb)    Wt Readings from Last 3 Encounters:  10/05/17 66.2 kg (146 lb)  10/03/17 65 kg (143 lb 3.2 oz)  09/25/17 63.3 kg (139 lb 8 oz)    PHYSICAL EXAM: General:  Well appearing. No respiratory difficulty HEENT: normal Neck: supple. no JVD. Carotids 2+ bilat; no bruits. No lymphadenopathy or thyromegaly appreciated. Cor: PMI nondisplaced. Regular rate & rhythm. No rubs, gallops or murmurs. Lungs: clear Abdomen: soft, nontender, nondistended. No hepatosplenomegaly. No bruits or masses. Good bowel sounds. Extremities: no cyanosis, clubbing, rash, edema Neuro: alert & oriented x 3, cranial nerves grossly intact. moves all 4 extremities w/o difficulty. Affect pleasant.  ECG: NSR 69 bpm, QRS 156 ms, personally reviewed.  ASSESSMENT & PLAN:  1. 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 - NYHA III, confounded by fatigue, no outright SOB. - Volume status stable on exam.   - Continue lasix 20 mg daily. Can take additional 20 mg as needed. Reviewed sliding scale diuretics today.  - Continue coreg 3.125 mg BID for now.  - Consider low dose losartan at next visit.  - Add spiro 12.5 qhs for now.  - May need to consider digoxin, but will hold off for now as  HR has been lower.   2. LBBB - QRS 156 ms by EKG today.  - Will check cMRI to r/o infiltrative CMP prior to ICD placement, if needed. - ? If this is etiology of her myopathy.  3. ETOH abuse - Encouraged continued complete cessation.   Meds as above. RTC 2 weeks with Pharm-D, 6 weeks with APP.   Graciella Freer, PA-C 10/05/17   Greater than 50% of the 45 minute visit was spent in counseling/coordination of care regarding disease state education, salt/fluid restriction, sliding scale diuretics, and medication compliance.

## 2017-10-06 ENCOUNTER — Other Ambulatory Visit (INDEPENDENT_AMBULATORY_CARE_PROVIDER_SITE_OTHER): Payer: BC Managed Care – PPO

## 2017-10-06 DIAGNOSIS — I428 Other cardiomyopathies: Secondary | ICD-10-CM | POA: Diagnosis not present

## 2017-10-06 DIAGNOSIS — I5021 Acute systolic (congestive) heart failure: Secondary | ICD-10-CM | POA: Diagnosis not present

## 2017-10-06 DIAGNOSIS — I447 Left bundle-branch block, unspecified: Secondary | ICD-10-CM

## 2017-10-12 ENCOUNTER — Encounter: Payer: Self-pay | Admitting: Student

## 2017-10-24 ENCOUNTER — Ambulatory Visit (HOSPITAL_COMMUNITY)
Admission: RE | Admit: 2017-10-24 | Discharge: 2017-10-24 | Disposition: A | Discharge status: Home / Self Care | Payer: BC Managed Care – PPO | Source: Ambulatory Visit | Attending: Student | Admitting: Student

## 2017-10-24 DIAGNOSIS — I081 Rheumatic disorders of both mitral and tricuspid valves: Secondary | ICD-10-CM | POA: Diagnosis not present

## 2017-10-24 DIAGNOSIS — I428 Other cardiomyopathies: Secondary | ICD-10-CM | POA: Diagnosis not present

## 2017-10-24 MED ORDER — GADOBUTROL 1 MMOL/ML IV SOLN
10.0000 mL | Freq: Once | INTRAVENOUS | Status: AC | PRN
  Administered 2017-10-24: 10 mL via INTRAVENOUS

## 2017-11-15 ENCOUNTER — Encounter (HOSPITAL_COMMUNITY): Payer: Self-pay

## 2017-11-15 ENCOUNTER — Ambulatory Visit (HOSPITAL_COMMUNITY)
Admission: RE | Admit: 2017-11-15 | Discharge: 2017-11-15 | Disposition: A | Discharge status: Home / Self Care | Payer: BC Managed Care – PPO | Source: Ambulatory Visit | Attending: Internal Medicine | Admitting: Internal Medicine

## 2017-11-15 VITALS — BP 124/78 | HR 81 | Wt 153.2 lb

## 2017-11-15 DIAGNOSIS — Z87891 Personal history of nicotine dependence: Secondary | ICD-10-CM | POA: Diagnosis not present

## 2017-11-15 DIAGNOSIS — I5022 Chronic systolic (congestive) heart failure: Secondary | ICD-10-CM | POA: Insufficient documentation

## 2017-11-15 DIAGNOSIS — IMO0001 Reserved for inherently not codable concepts without codable children: Secondary | ICD-10-CM

## 2017-11-15 DIAGNOSIS — I959 Hypotension, unspecified: Secondary | ICD-10-CM | POA: Insufficient documentation

## 2017-11-15 DIAGNOSIS — I428 Other cardiomyopathies: Secondary | ICD-10-CM | POA: Insufficient documentation

## 2017-11-15 DIAGNOSIS — I110 Hypertensive heart disease with heart failure: Secondary | ICD-10-CM | POA: Insufficient documentation

## 2017-11-15 DIAGNOSIS — F1010 Alcohol abuse, uncomplicated: Secondary | ICD-10-CM

## 2017-11-15 DIAGNOSIS — I5021 Acute systolic (congestive) heart failure: Secondary | ICD-10-CM

## 2017-11-15 DIAGNOSIS — Z8701 Personal history of pneumonia (recurrent): Secondary | ICD-10-CM | POA: Diagnosis not present

## 2017-11-15 DIAGNOSIS — Z880 Allergy status to penicillin: Secondary | ICD-10-CM | POA: Insufficient documentation

## 2017-11-15 DIAGNOSIS — Z79899 Other long term (current) drug therapy: Secondary | ICD-10-CM | POA: Diagnosis not present

## 2017-11-15 DIAGNOSIS — I447 Left bundle-branch block, unspecified: Secondary | ICD-10-CM | POA: Diagnosis not present

## 2017-11-15 DIAGNOSIS — M549 Dorsalgia, unspecified: Secondary | ICD-10-CM | POA: Insufficient documentation

## 2017-11-15 DIAGNOSIS — M109 Gout, unspecified: Secondary | ICD-10-CM | POA: Insufficient documentation

## 2017-11-15 DIAGNOSIS — Z0389 Encounter for observation for other suspected diseases and conditions ruled out: Secondary | ICD-10-CM

## 2017-11-15 LAB — BASIC METABOLIC PANEL
ANION GAP: 8 (ref 5–15)
BUN: 14 mg/dL (ref 8–23)
CO2: 27 mmol/L (ref 22–32)
Calcium: 9.7 mg/dL (ref 8.9–10.3)
Chloride: 106 mmol/L (ref 98–111)
Creatinine, Ser: 0.89 mg/dL (ref 0.44–1.00)
GFR calc Af Amer: >60 mL/min (ref >60)
Glucose, Bld: 113 mg/dL — ABNORMAL HIGH (ref 70–99)
POTASSIUM: 3.8 mmol/L (ref 3.5–5.1)
SODIUM: 141 mmol/L (ref 135–145)

## 2017-11-15 MED ORDER — LOSARTAN POTASSIUM 25 MG PO TABS: 12.5000 mg | ORAL_TABLET | Freq: Two times a day (BID) | ORAL | 3 refills | Status: DC

## 2017-11-15 NOTE — Progress Notes (Signed)
CSW met with patient to discuss social determinants of health.  Patient reports that she lives in apartment that she rents- no concerns about losing housing at this time.  Patient has income from part time work at [employer] as well as from state pension.  Patient reports sometimes it is tight paying for utilities/food/ etc but that she never goes without what she needs.  CSW reports no needs at this time- CSW will continue to follow in clinic and assist as needed  Jenna H. Uris, LCSW Clinical Social Worker [phone]   

## 2017-11-15 NOTE — Patient Instructions (Addendum)
Today you have been seen at the Heart failure clinic at [facility]   Medication changes: Losartan 12.5mg  BID   Lab work done today:BMET   Follow up with Cicero Duck with pharmacy in 2 weeks then in 4 weeks, follow up with Dr. [name] in 2 months   You have the following test scheduled for: ECHO  Your physician has requested that you have an echocardiogram. Echocardiography is a painless test that uses sound waves to create images of your heart. It provides your doctor with information about the size and shape of your heart and how well your heart's chambers and valves are working. This procedure takes approximately one hour. There are no restrictions for this procedure.

## 2017-11-15 NOTE — Progress Notes (Signed)
Advanced Heart Failure Clinic Note   Referring Physician: PCP: Deatra James, MD PCP-Cardiologist: Thurmon Fair, MD   HPI:  Beverly Andrews is a 62 y.o. female with chronic systolic CHF, NICM, new LBBB, and hypotension limiting med titration.   Seen in hospital 09/18/17 with DOE and new LBBB. Echo with new systolic CHF as below. Cath with normal coronaries. Planned for med optimization and possible CRT device if EF does not improve. Referred to see CHF clinic with hypotension limiting med titration.   She presents today for regular follow up. cMRI as below. Cleda Daub added at last visit. She is feeling slightly better, but overall about the same. She continues to work at the [employer]. She denies overt SOB, but remains fatigued with work and mild to moderate exertion. No orthopnea. She takes extra lasix every 2-3 days for swelling in her ankles. She denies ETOH use. She does state she has 2 cousins who have HF under similar circumstances, and also a cousin who passed away at 54 years old from sudden cardiac death. No cough. Continues to have intermittent back pain. Taking all medications as directed. Denies CP.   cMRI 10/24/17 with EF 20-25%, Normal RV, normal atria. Findings suspicious for non-compaction cardiomyopathy.   Echo 09/19/17 LVEF 20-25%, LBBB, Mod central MR.   [facility] 09/20/17 - Normal coronary arteries Hemodynamics (mmHg) RA mean 2 RV 31/0 PA 30/11 PCWP 11 AO 111/70 Cardiac Output (Fick) 5.72 Cardiac Index (Fick) 3.28  Review of systems complete and found to be negative unless listed in HPI.    Past Medical History:  Diagnosis Date  . Alcohol abuse   . Gout   . History of cardiac cath 09/24/2017   09/24/17: normal coronary arteries, EF <25%  . Hypertension   . Non-ischemic cardiomyopathy (HCC) 09/18/2017   09/18/17- EF 20-25%  . Recurrent pneumonia     Current Outpatient Medications  Medication Sig Dispense Refill  . albuterol (PROVENTIL HFA;VENTOLIN HFA) 108 (90  Base) MCG/ACT inhaler Inhale 1-2 puffs into the lungs every 6 (six) hours as needed for wheezing or shortness of breath.    . Biotin 10 MG TABS Take 10 mg by mouth daily.     . carvedilol (COREG) 3.125 MG tablet Take 1 tablet (3.125 mg total) by mouth 2 (two) times daily with a meal. 60 tablet 11  . cholecalciferol (VITAMIN D) 1000 units tablet Take 1,000 Units by mouth daily.    . colchicine 0.6 MG tablet Take 0.6 mg by mouth daily as needed (gout flare up).    . Collagen 500 MG CAPS Take 500 mg by mouth daily.     . furosemide (LASIX) 20 MG tablet Take 1 tablet (20 mg total) by mouth daily. Take an additional tablet if weight increases by 2-3 pounds. 30 tablet 5  . Multiple Vitamin (MULTIVITAMIN WITH MINERALS) TABS tablet Take 1 tablet by mouth daily. 90 tablet 3  . potassium chloride SA (K-DUR,KLOR-CON) 20 MEQ tablet Take 1 tablet (20 mEq total) by mouth daily. 30 tablet 5  . spironolactone (ALDACTONE) 25 MG tablet Take 0.5 tablets (12.5 mg total) by mouth daily. 45 tablet 3  . thiamine 100 MG tablet Take 1 tablet (100 mg total) by mouth daily. 30 tablet 5  . vitamin C (ASCORBIC ACID) 500 MG tablet Take 500 mg by mouth daily.     No current facility-administered medications for this encounter.     Allergies  Allergen Reactions  . Shellfish Allergy Swelling  . Amoxicillin Itching and Swelling  .  Other Hives and Swelling    carbocaine  . Penicillins Itching and Swelling    Has patient had a PCN reaction causing immediate rash, facial/tongue/throat swelling, SOB or lightheadedness with hypotension: Yes Has patient had a PCN reaction causing severe rash involving mucus membranes or skin necrosis: No Has patient had a PCN reaction that required hospitalization: No Has patient had a PCN reaction occurring within the last 10 years: Yes If all of the above answers are "NO", then may proceed with Cephalosporin use.       Social History   Socioeconomic History  . Marital status: Divorced      Spouse name: Not on file  . Number of children: Not on file  . Years of education: Not on file  . Highest education level: Not on file  Occupational History  . Occupation: Works at an [employer]  . Financial resource strain: Not very hard  . Food insecurity:    Worry: Never true    Inability: Never true  . Transportation needs:    Medical: No    Non-medical: No  Tobacco Use  . Smoking status: Former Games developer  . Smokeless tobacco: Never Used  . Tobacco comment: quit 6 months ago  Substance and Sexual Activity  . Alcohol use: Yes    Alcohol/week: 21.0 standard drinks    Types: 21 Shots of liquor per week  . Drug use: Never  . Sexual activity: Not on file  Lifestyle  . Physical activity:    Days per week: Not on file    Minutes per session: Not on file  . Stress: Not on file  Relationships  . Social connections:    Talks on phone: Not on file    Gets together: Not on file    Attends religious service: Not on file    Active member of club or organization: Not on file    Attends meetings of clubs or organizations: Not on file    Relationship status: Not on file  . Intimate partner violence:    Fear of current or ex partner: Not on file    Emotionally abused: Not on file    Physically abused: Not on file    Forced sexual activity: Not on file  Other Topics Concern  . Not on file  Social History Narrative  . Not on file      Family History  Problem Relation Age of Onset  . AAA (abdominal aortic aneurysm) Father   . Aortic stenosis Father     Vitals:   11/15/17 0953  BP: 124/78  Pulse: 81  SpO2: 100%  Weight: 69.5 kg (153 lb 3.2 oz)    Wt Readings from Last 3 Encounters:  11/15/17 69.5 kg (153 lb 3.2 oz)  10/05/17 66.2 kg (146 lb)  10/03/17 65 kg (143 lb 3.2 oz)    PHYSICAL EXAM: General: Well appearing. No resp difficulty. HEENT: Normal Neck: Supple. JVP 6-7 cm. Carotids 2+ bilat; no bruits. No thyromegaly or nodule noted. Cor: PMI  nondisplaced. RRR, No M/G/R noted Lungs: CTAB, normal effort. Abdomen: Soft, non-tender, non-distended, no HSM. No bruits or masses. +BS  Extremities: No cyanosis, clubbing, or rash. R and LLE no edema.  Neuro: Alert & orientedx3, cranial nerves grossly intact. moves all 4 extremities w/o difficulty. Affect pleasant   ASSESSMENT & PLAN:  1. 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 - cMRI 10/24/17 with EF  20-25%, Normal RV, normal atria. Findings suspicious for non-compaction cardiomyopathy.  - NYHA III symptom - Volume status stable on exam today.   - Continue lasix 20 mg daily. Can take additional 20 mg as needed. Reviewed sliding scale diuretics today.  - Continue coreg 3.125 mg BID for now.  - Start losartan 12.5 mg BID today. She tolerated Entresto very poorly in hospital with marked hypotension, so will build up slowly.  - Continue spiro 12.5 qhs for now. BMET today.  - May need to consider digoxin, but will hold off for now as HR has been lower.   2. LBBB - QRS 156 ms by EKG 10/05/17.  - If needs ICD, may benefit from CRT-D  3. ETOH abuse - Encouraged complete cessation.   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 Offered genetic screening, she wishes to think over and will call us back.  - No evidence of arrythmias thus far. Will follow closely for need for [facility].  - Continue GDMT and titration as above, with Echo in December for ICD consideration.   Meds as above. RTC 2 and 4 weeks with Pharm D, and then MD clinic in 2 months with Echo for ICD consideration. Discussed plan with Dr. [name].   Graciella Freer, PA-C 11/15/17   Greater than 50% of the 25 minute visit was spent in counseling/coordination of care regarding disease state education, salt/fluid restriction, sliding scale diuretics, and medication compliance.

## 2017-11-27 ENCOUNTER — Ambulatory Visit (HOSPITAL_COMMUNITY)
Admission: RE | Admit: 2017-11-27 | Discharge: 2017-11-27 | Disposition: A | Discharge status: Home / Self Care | Payer: BC Managed Care – PPO | Source: Ambulatory Visit | Attending: Internal Medicine | Admitting: Internal Medicine

## 2017-11-27 VITALS — BP 144/92 | HR 67 | Wt 156.0 lb

## 2017-11-27 DIAGNOSIS — I428 Other cardiomyopathies: Secondary | ICD-10-CM | POA: Insufficient documentation

## 2017-11-27 DIAGNOSIS — I959 Hypotension, unspecified: Secondary | ICD-10-CM | POA: Diagnosis not present

## 2017-11-27 DIAGNOSIS — I5022 Chronic systolic (congestive) heart failure: Secondary | ICD-10-CM | POA: Insufficient documentation

## 2017-11-27 DIAGNOSIS — Z79899 Other long term (current) drug therapy: Secondary | ICD-10-CM | POA: Insufficient documentation

## 2017-11-27 DIAGNOSIS — I447 Left bundle-branch block, unspecified: Secondary | ICD-10-CM | POA: Diagnosis not present

## 2017-11-27 LAB — BASIC METABOLIC PANEL
Anion gap: 9 (ref 5–15)
BUN: 12 mg/dL (ref 8–23)
CO2: 27 mmol/L (ref 22–32)
CREATININE: 1.01 mg/dL — AB (ref 0.44–1.00)
Calcium: 9.8 mg/dL (ref 8.9–10.3)
Chloride: 104 mmol/L (ref 98–111)
GFR calc Af Amer: >60 mL/min (ref >60)
GFR, EST NON AFRICAN AMERICAN: 59 mL/min — AB (ref >60)
GLUCOSE: 87 mg/dL (ref 70–99)
Potassium: 4.2 mmol/L (ref 3.5–5.1)
Sodium: 140 mmol/L (ref 135–145)

## 2017-11-27 MED ORDER — SACUBITRIL-VALSARTAN 24-26 MG PO TABS: 1.0000 | ORAL_TABLET | Freq: Two times a day (BID) | ORAL | 11 refills | Status: DC

## 2017-11-27 NOTE — Progress Notes (Signed)
HF MD: [name]   HPI:  Beverly Andrews is a 62 y.o. female with chronic systolic CHF, NICM, new LBBB, and hypotension limiting med titration.   Seen in hospital 09/18/17 with DOE and new LBBB. Echo with new systolic CHF as below. Cath with normal coronaries. Planned for med optimization and possible CRT device if EF does not improve. Referred to see CHF clinic with hypotension limiting med titration.   She presents today for pharmacist-led HF medication titration. At last HF clinic visit on 10/9, losartan 12.5 mg PO BID was started. She did notice once she got home after her last visit that she was already taking losartan 50 mg daily so she has been taking 62.5 mg QAM and 12.5 mg QPM. She is feeling slightly better, but overall about the same. She does get dizzy when turning over in bed sometimes and upon standing too quickly. She is still fighting a cold but it seems to be getting better slowly. She continues to work at the [employer]. She denies overt SOB, but remains fatigued with work and mild to moderate exertion. She takes extra lasix every 2-3 days for swelling in her ankles/"bloating". She feels her weight gain is more due to her eating larger portions and more sugar containing foods recently. She denies ETOH use. No cough. Continues to have intermittent back pain. Taking all medications as directed. Denies CP.     Marland Kitchen Shortness of breath/dyspnea on exertion? no  . Orthopnea/PND? no . Edema? no . Lightheadedness/dizziness? Yes - when turning over in bed and getting up too quickly . Daily weights at home? Yes - trending up with diet  . Blood pressure/heart rate monitoring at home? no . Following low-sodium/fluid-restricted diet? No - has been eating more sugar/processed foods  HF Medications: Carvedilol 3.125 mg PO BID Furosemide 20 mg PO daily with additional 20 mg PRN for weight increase >2-3 lb -- every other day takes additional tablet for bloated feeling/weight gain Losartan 62.5 mg PO  QAM and 12.5 mg QPM  KCL 20 mEq PO daily Spironolactone 12.5 mg PO daily   Has the patient been experiencing any side effects to the medications prescribed?  no  Does the patient have any problems obtaining medications due to transportation or finances?   No - [employer] commercial   Understanding of regimen: good Understanding of indications: good Potential of compliance: good Patient understands to avoid NSAIDs. Patient understands to avoid decongestants.    Pertinent Lab Values:  11/27/17: Serum creatinine 1.01, BUN 12, Potassium 4.2, Sodium 140  Vital Signs: . Weight: 156 lb (dry weight: 153 lb) . Blood pressure: 142/92 mmHg  . Heart rate: 67 bpm   ReDS clip - 42%  Assessment: 1. Chronic systolic CHF (EF 24-93%), due to NICM (?LBBB induced CM/infiltrative/viral). NYHA class III symptoms.  - Volume status slightly elevated today based on weight gain and ReDS vest reading  - She is to take additional furosemide 20 mg x 1 today then continue 20 mg daily with additional 20 mg PRN weight gain/edema  - Will have her d/c losartan and start Entresto 24-26 mg BID cautiously since she tolerated it poorly in the hospital with hypotension  - Continue carvedilol 3.125 mg BID, KCl 20 mEq daily and spironolactone 12.5 mg daily  - Basic disease state pathophysiology, medication indication, mechanism and side effects reviewed at length with patient and she verbalized understanding  2. LBBB - QRS 156 ms by EKG 10/05/17 - If needs ICD, may benefit from CRT-D  3. ETOH abuse - Encouraged complete cessation  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 Offered genetic screening, she wishes to think over and will call us back.  - No evidence of arrythmias thus far. Will follow closely for need for [facility] - Continue GDMT and titration as above, with Echo in December for ICD consideration   Plan: 1) Medication changes: Based on clinical presentation, vital signs and recent labs will stop losartan  and cautiously start Entresto 24-26 mg BID 2) Labs: BMET today and at next visit  3) Follow-up: Pharmacist visit on 11/6 and Dr. [name] on 12/10   Tyler Deis. Bonnye Fava, PharmD, BCPS, CPP Clinical Pharmacist Phone: [phone] 11/27/2017 8:41 AM

## 2017-11-27 NOTE — Patient Instructions (Signed)
It was great to meet you!  Please take ADDITIONAL furosemide 20 mg tablet ONCE TODAY then continue 1 tablet daily with additional tablet as needed for weight gain/swelling.  Please STOP losartan.   Please START Entresto 24-26 mg (1 tablet) TWICE DAILY.   Blood work today. We will call you with any changes.   You are scheduled with the pharmacist again on 12/13/17 and with Dr. [name] on 01/16/18.

## 2017-11-28 ENCOUNTER — Telehealth (HOSPITAL_COMMUNITY): Payer: Self-pay | Admitting: Pharmacist

## 2017-11-28 NOTE — Telephone Encounter (Signed)
Entresto 24-26 mg PO BID PA approved by [facility] through 11/28/18.   Tyler Deis. Bonnye Fava, PharmD, BCPS, CPP Clinical Pharmacist Phone: [phone] 11/28/2017 2:24 PM

## 2017-11-28 NOTE — Telephone Encounter (Signed)
Beverly Andrews called stating that [facility] told her it would take 5-7 days for the PA for her Entresto to be approved. I have called [facility] and asked them to re-run the Rx since the PA was already approved. They verified that it was covered by her insurance and her copay would be $47 but it should go down to $10 with the copay card. Attempted to call Beverly Andrews back with this info but she did not have a VM set up.   Tyler Deis. Bonnye Fava, PharmD, BCPS, CPP Clinical Pharmacist Phone: [phone] 11/28/2017 3:43 PM

## 2017-11-29 ENCOUNTER — Ambulatory Visit (HOSPITAL_COMMUNITY): Payer: BC Managed Care – PPO

## 2017-12-13 ENCOUNTER — Ambulatory Visit (HOSPITAL_COMMUNITY)
Admission: RE | Admit: 2017-12-13 | Discharge: 2017-12-13 | Disposition: A | Discharge status: Home / Self Care | Payer: BC Managed Care – PPO | Source: Ambulatory Visit | Attending: Cardiology | Admitting: Cardiology

## 2017-12-13 VITALS — BP 118/80 | HR 75 | Wt 158.4 lb

## 2017-12-13 DIAGNOSIS — I5022 Chronic systolic (congestive) heart failure: Secondary | ICD-10-CM | POA: Diagnosis not present

## 2017-12-13 DIAGNOSIS — I447 Left bundle-branch block, unspecified: Secondary | ICD-10-CM | POA: Insufficient documentation

## 2017-12-13 DIAGNOSIS — I428 Other cardiomyopathies: Secondary | ICD-10-CM

## 2017-12-13 DIAGNOSIS — F1010 Alcohol abuse, uncomplicated: Secondary | ICD-10-CM | POA: Insufficient documentation

## 2017-12-13 LAB — BASIC METABOLIC PANEL
ANION GAP: 9 (ref 5–15)
BUN: 31 mg/dL — ABNORMAL HIGH (ref 8–23)
CHLORIDE: 103 mmol/L (ref 98–111)
CO2: 27 mmol/L (ref 22–32)
Calcium: 9.7 mg/dL (ref 8.9–10.3)
Creatinine, Ser: 1.15 mg/dL — ABNORMAL HIGH (ref 0.44–1.00)
GFR, EST AFRICAN AMERICAN: 58 mL/min — AB (ref >60)
GFR, EST NON AFRICAN AMERICAN: 50 mL/min — AB (ref >60)
Glucose, Bld: 89 mg/dL (ref 70–99)
POTASSIUM: 4.3 mmol/L (ref 3.5–5.1)
SODIUM: 139 mmol/L (ref 135–145)

## 2017-12-13 MED ORDER — FUROSEMIDE 20 MG PO TABS: 40.0000 mg | ORAL_TABLET | Freq: Every day | ORAL | 5 refills | Status: DC

## 2017-12-13 MED ORDER — SPIRONOLACTONE 25 MG PO TABS: 25.0000 mg | ORAL_TABLET | Freq: Every day | ORAL | 3 refills | Status: DC

## 2017-12-13 NOTE — Patient Instructions (Signed)
It was nice to see you today!  INCREASE your spironolactone to 25 mg (1 whole tablet) every evening  CONTINUE your furosemide 40 mg (2 tablets) once daily  Blood work today. We will call you with any changes.  Please call Cicero Duck to set up appointment in two weeks.

## 2017-12-13 NOTE — Progress Notes (Signed)
HF MD: [name]  HPI:  Beverly Andrews a 62 y.o.femalewith chronic systolic CHF, NICM, new LBBB, and hypotension limiting med titration.   Seen in hospital 09/18/17 with DOE and new LBBB. Echo with new systolic CHF as below. Cath with normal coronaries. Planned for med optimization and possible CRT device if EF does not improve. Referred to see CHF clinic with hypotension limiting med titration.   She presents todayfor pharmacist-led HF medication titration.At last HF pharmacy visit on 10/21, her losartan was cautiously switched to Entresto 24/26 mg BID. Overall, she feels the same since her last visit. She has noticed slightly more dizziness than normal, but this did not change or worsen with the Entresto initiation. She endorses congestion/nasal fluids whenever she bends over but reports no other fluid retention. She does not believe her increasing weight at home is due to fluid retention, but she has been using an extra dose of Lasix (40mg  total) every day. Takes all medications as directed.   Shortness of breath/dyspnea on exertion? Yes - stable  Orthopnea/PND? Yes - stable (uses two pillows)  Edema? No   Lightheadedness/dizziness? Yes - more than normal but did not change with initiation of Entresto  Daily weights at home? Yes - stable. Increased weight but not due to fluid retention  Blood pressure/heart rate monitoring at home? No - BP monitor at home broke  Following low-sodium/fluid-restricted diet? Yes   HF Medications: Carvedilol 3.125 mg PO BID Furosemide 20 mg PO daily Entresto 24/26 mg PO BID  Spironolactone 12.5 mg PO daily  Has the patient been experiencing any side effects to the medications prescribed?  no   Does the patient have any problems obtaining medications due to transportation or finances?   No - [employer] commercial. Provided $10 copay card for Entresto.  Understanding of regimen: good Understanding of indications: good Potential of  compliance: good Patient understands to avoid NSAIDs. Patient understands to avoid decongestants.   Pertinent Lab Values:  12/13/17: Serum creatinine 1.15, BUN 31, Potassium 4.3, Sodium 139  11/27/17: Serum creatinine 1.01, BUN 12, Potassium 4.2, Sodium 140  Vital Signs:  Weight: 158 lb (last clinic weight: 156 lb, dry weight 153 lb)  Blood pressure: 118/80 mmHg   Heart rate: 75 bpm   ReDS clip - 42% >> 42%  Assessment: 1. Chronicsystolic CHF (EF 16-10%), due to NICM (?LBBB induced CM/infiltrative/viral). NYHA class IIIsymptoms. - Volume status stable based on weight and ReDS vest reading. BP has decreased but HR increased since last clinic visit. - Will have her increase her furosemide from 20 mg daily to 40 mg daily - Will have her increase spironolactone from 12.5 mg daily to 25 mg daily - Continue carvedilol 3.125 mg BID, KCl 20 mEq daily, and Entresto 24/26 mg BID - Provided education/informational handout on over-the-counter medications for her nasal congestion - Basic disease state pathophysiology, medication indication, mechanism and side effects reviewed at length with patient and he verbalized understanding  2. LBBB - QRS 156 ms by EKG8/29/19 - If needs ICD, may benefit from CRT-D  3. ETOH abuse -Encouraged completecessation  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 Offered genetic screening, she wishes to think over and will call us back.  - No evidence of arrythmias thus far. Will follow closely for need for [facility] - Continue GDMT and titration as above, with Echo in December for ICD consideration  Plan: 1) Medication changes: Based on clinical presentation, vital signs and recent labs will increase spironolactone to 25 mg in the evening and  furosemide to 40 mg daily 2) Labs: BMET today and at next visit 3) Follow-up: Pharmacist visit in two weeks and Dr. [name] 12/10. Patient will call to schedule pharmacy appointment.   Tyler Deis.  Bonnye Fava, PharmD, BCPS, CPP Clinical Pharmacist Phone: [phone] 12/12/2017 2:31 PM

## 2018-01-02 ENCOUNTER — Ambulatory Visit: Payer: BC Managed Care – PPO | Admitting: Cardiovascular Disease

## 2018-01-16 ENCOUNTER — Ambulatory Visit (HOSPITAL_COMMUNITY): Admission: RE | Admit: 2018-01-16 | Payer: BC Managed Care – PPO | Source: Ambulatory Visit

## 2018-01-16 ENCOUNTER — Other Ambulatory Visit: Payer: Self-pay | Admitting: Internal Medicine

## 2018-01-16 ENCOUNTER — Encounter (HOSPITAL_COMMUNITY): Payer: BC Managed Care – PPO | Admitting: Internal Medicine

## 2018-01-16 DIAGNOSIS — I509 Heart failure, unspecified: Secondary | ICD-10-CM

## 2018-01-17 ENCOUNTER — Other Ambulatory Visit: Payer: Self-pay | Admitting: Cardiology

## 2018-01-18 ENCOUNTER — Encounter (INDEPENDENT_AMBULATORY_CARE_PROVIDER_SITE_OTHER): Payer: Self-pay

## 2018-01-18 ENCOUNTER — Encounter: Payer: Self-pay | Admitting: Physician Assistant

## 2018-01-18 ENCOUNTER — Ambulatory Visit (INDEPENDENT_AMBULATORY_CARE_PROVIDER_SITE_OTHER): Payer: BC Managed Care – PPO | Admitting: Physician Assistant

## 2018-01-18 VITALS — BP 120/56 | HR 69 | Ht 67.0 in | Wt 162.0 lb

## 2018-01-18 DIAGNOSIS — I428 Other cardiomyopathies: Secondary | ICD-10-CM | POA: Diagnosis not present

## 2018-01-18 DIAGNOSIS — I447 Left bundle-branch block, unspecified: Secondary | ICD-10-CM

## 2018-01-18 MED ORDER — CARVEDILOL 3.125 MG PO TABS: 3.1250 mg | ORAL_TABLET | Freq: Two times a day (BID) | ORAL | 1 refills | Status: DC

## 2018-01-18 NOTE — Patient Instructions (Addendum)
Medication Instructions:  Restart Carvediolol medication twice daily If you need a refill on your cardiac medications before your next appointment, please call your pharmacy.   Lab work: BMP, MAG If you have labs (blood work) drawn today and your tests are completely normal, you will receive your results only by: Marland Kitchen MyChart Message (if you have MyChart) OR . A paper copy in the mail If you have any lab test that is abnormal or we need to change your treatment, we will call you to review the results.  Testing/Procedures: NONE  Follow-Up: At [facility], you and your health needs are our priority.  As part of our continuing mission to provide you with exceptional heart care, we have created designated Provider Care Teams.  These Care Teams include your primary Cardiologist (physician) and Advanced Practice Providers (APPs -  Physician Assistants and Nurse Practitioners) who all work together to provide you with the care you need, when you need it. Marland Kitchen Keep follow up with Dr.Bensimhon. Marland Kitchen Keep follow up with HF Pharmacy   Any Other Special Instructions Will Be Listed Below (If Applicable). None

## 2018-01-18 NOTE — Addendum Note (Signed)
Addended by: Darene Lamer T on: 01/18/2018 10:43 AM   Modules accepted: Orders

## 2018-01-18 NOTE — Progress Notes (Signed)
Cardiology Office Note    Date:  01/18/2018   ID:  Beverly Andrews, DOB 04/12/1955, MRN [id number]  PCP:  Deatra James, MD  Cardiologist: Dr. Royann Shivers Heart failure clinic  Chief Complaint  Patient presents with  . Follow-up    seen for Dr. Royann Shivers. Medication adjustment    History of Present Illness:  Beverly Andrews is a 62 y.o. female with PMH of NICM and LBBB.  Patient initially presented in August 2019 with dyspnea and a new left bundle branch block and found to have a EF of 20 to 25% with global hypokinesis.  Cardiac catheterization performed on 09/20/2017 showed normal coronaries.  Initial suspicion was either viral or possibly infiltrative cardiomyopathy.  Right heart cath showed wedge pressure 11, PA pressure 30/11, cardiac output 5.72, cardiac index 3.28.  He diuresed 6 pounds with improvement in her dyspnea.  Sherryll Burger was initiated in the hospital, however she had low blood pressure and near syncope.  Surgical plan is to consider CRT device if she does not respond to medical therapy and does not have improvement in her ejection fraction.  Cardiac MRI obtained on 10/24/2017 showed EF 20 to 25%, normal RV and a normal atria, findings suspicious for non-compaction cardiomyopathy.  She was seen by heart failure clinic on 11/15/2017, genetic testing was recommended, patient wished to think that over.  Patient presents today for cardiology office visit.  She denies any lower extremity edema, orthopnea or PND.  She still occasionally has some shortness of breath with exertion.  She also have bilateral arm numbness at night.  I will obtain a basic metabolic panel to check for electrolyte imbalance.  She denies any cervical neck pain.  For some reason she is no longer taking the carvedilol, I think she likely ran out of the medication.  I will restart the carvedilol at 3.125 mg twice daily.  I did think about increasing Entresto to 49-51 mg, however she had an episode of presyncope in the hospital  when the medication was adjusted too quickly.  I will move up her pharmacy appointment to early January, if her BP is stable at the time, potentially increase the Entresto and possibly increase carvedilol as well.   Past Medical History:  Diagnosis Date  . Alcohol abuse   . Gout   . History of cardiac cath 09/24/2017   09/24/17: normal coronary arteries, EF <25%  . Hypertension   . Non-ischemic cardiomyopathy (HCC) 09/18/2017   09/18/17- EF 20-25%  . Recurrent pneumonia     Past Surgical History:  Procedure Laterality Date  . KNEE ARTHROSCOPY    . RIGHT/LEFT HEART CATH AND CORONARY ANGIOGRAPHY N/A 09/20/2017   Procedure: RIGHT/LEFT HEART CATH AND CORONARY ANGIOGRAPHY;  Surgeon: Iran Ouch, MD;  Location: [facility];  Service: Cardiovascular;  Laterality: N/A;  . TONSILLECTOMY      Current Medications: Outpatient Medications Prior to Visit  Medication Sig Dispense Refill  . albuterol (PROVENTIL HFA;VENTOLIN HFA) 108 (90 Base) MCG/ACT inhaler Inhale 1-2 puffs into the lungs every 6 (six) hours as needed for wheezing or shortness of breath.    . Biotin 10 MG TABS Take 10 mg by mouth daily.     . cholecalciferol (VITAMIN D) 1000 units tablet Take 1,000 Units by mouth daily.    . colchicine 0.6 MG tablet Take 0.6 mg by mouth daily as needed (gout flare up).    . Collagen 500 MG CAPS Take 500 mg by mouth daily.     Marland Kitchen  diphenhydrAMINE (BENADRYL) 25 MG tablet Take 25 mg by mouth every 6 (six) hours as needed for sleep.     . furosemide (LASIX) 20 MG tablet Take 2 tablets (40 mg total) by mouth daily. Take an additional tablet if weight increases by 2-3 pounds. 60 tablet 5  . Multiple Vitamin (MULTIVITAMIN WITH MINERALS) TABS tablet Take 1 tablet by mouth daily. 90 tablet 3  . potassium chloride SA (K-DUR,KLOR-CON) 20 MEQ tablet Take 1 tablet (20 mEq total) by mouth daily. 30 tablet 5  . sacubitril-valsartan (ENTRESTO) 24-26 MG Take 1 tablet by mouth 2 (two) times daily. 60  tablet 11  . spironolactone (ALDACTONE) 25 MG tablet Take 1 tablet (25 mg total) by mouth daily. 90 tablet 3  . thiamine 100 MG tablet Take 1 tablet (100 mg total) by mouth daily. 30 tablet 5  . vitamin C (ASCORBIC ACID) 500 MG tablet Take 500 mg by mouth daily.    . carvedilol (COREG) 3.125 MG tablet Take 1 tablet (3.125 mg total) by mouth 2 (two) times daily with a meal. (Patient not taking: Reported on 01/18/2018) 60 tablet 11   No facility-administered medications prior to visit.      Allergies:   Shellfish allergy; Amoxicillin; Other; and Penicillins   Social History   Socioeconomic History  . Marital status: Divorced    Spouse name: Not on file  . Number of children: Not on file  . Years of education: Not on file  . Highest education level: Not on file  Occupational History  . Occupation: Works at an [employer]  . Financial resource strain: Not very hard  . Food insecurity:    Worry: Never true    Inability: Never true  . Transportation needs:    Medical: No    Non-medical: No  Tobacco Use  . Smoking status: Former Games developer  . Smokeless tobacco: Never Used  . Tobacco comment: quit 6 months ago  Substance and Sexual Activity  . Alcohol use: Yes    Alcohol/week: 21.0 standard drinks    Types: 21 Shots of liquor per week  . Drug use: Never  . Sexual activity: Not on file  Lifestyle  . Physical activity:    Days per week: Not on file    Minutes per session: Not on file  . Stress: Not on file  Relationships  . Social connections:    Talks on phone: Not on file    Gets together: Not on file    Attends religious service: Not on file    Active member of club or organization: Not on file    Attends meetings of clubs or organizations: Not on file    Relationship status: Not on file  Other Topics Concern  . Not on file  Social History Narrative  . Not on file     Family History:  The patient's family history includes AAA (abdominal aortic aneurysm) in  her father; Aortic stenosis in her father.   ROS:   Please see the history of present illness.    ROS All other systems reviewed and are negative.   PHYSICAL EXAM:   VS:  BP (!) 120/56   Pulse 69   Ht 5\' 7"  (1.702 m)   Wt 162 lb (73.5 kg)   SpO2 99%   BMI 25.37 kg/m    GEN: Well nourished, well developed, in no acute distress  HEENT: normal  Neck: no JVD, carotid bruits, or masses Cardiac: RRR; no murmurs, rubs,  or gallops,no edema  Respiratory:  clear to auscultation bilaterally, normal work of breathing GI: soft, nontender, nondistended, + BS MS: no deformity or atrophy  Skin: warm and dry, no rash Neuro:  Alert and Oriented x 3, Strength and sensation are intact Psych: euthymic mood, full affect  Wt Readings from Last 3 Encounters:  01/18/18 162 lb (73.5 kg)  12/13/17 158 lb 6.4 oz (71.8 kg)  11/27/17 156 lb (70.8 kg)      Studies/Labs Reviewed:   EKG:  EKG is not ordered today.    Recent Labs: 09/19/2017: B Natriuretic Peptide 1,338.8 09/25/2017: Hemoglobin 14.8; Platelets 165; TSH 2.571 12/13/2017: BUN 31; Creatinine, Ser 1.15; Potassium 4.3; Sodium 139   Lipid Panel    Component Value Date/Time   CHOL 186 09/20/2017 0413   TRIG 71 09/20/2017 0413   HDL 73 09/20/2017 0413   CHOLHDL 2.5 09/20/2017 0413   VLDL 14 09/20/2017 0413   LDLCALC 99 09/20/2017 0413    Additional studies/ records that were reviewed today include:   Echo 09/19/2017 LV EF: 20% -   25% Study Conclusions  - Left ventricle: The cavity size was normal. Wall thickness was   normal. Systolic function was severely reduced. The estimated   ejection fraction was in the range of 20% to 25%. Severe diffuse   hypokinesis with regional variations. Possible disproportionate   hypokinesis of the inferior myocardium. There was fusion of early   and atrial contributions to ventricular filling. No evidence of   thrombus. - Ventricular septum: Septal motion showed abnormal function,   dyssynergy,  and paradox. These changes are consistent with a left   bundle branch block. - Mitral valve: There was moderate regurgitation directed   centrally.    Cath 09/20/2017  There is severe left ventricular systolic dysfunction.  The left ventricular ejection fraction is less than 25% by visual estimate.  LV end diastolic pressure is mildly elevated.   1.  Normal coronary arteries. 2.  Severely reduced LV systolic function with an EF of 20 to 25% with global hypokinesis. 3.  Right heart catheterization showed high normal filling pressures, normal pulmonary pressure and normal cardiac output.  Pulmonary capillary wedge pressure was 11 mmHg, PA pressure was 30 over 11 mmHg, cardiac output was 5.7 with a cardiac index of 3.2.  Recommendations: The patient has nonischemic cardiomyopathy.  Recommend medical therapy.  I switch furosemide to by mouth 40 mg once daily starting tomorrow and added small dose carvedilol.  Consider Entresto if blood pressure allows and spironolactone. If EF does not improve after 3 months on optimal medical therapy, CRT should be considered given underlying left bundle branch block.    Card MRI 10/24/2017 Myocardial mass: 197 g  2. Normal right ventricular size, thickness and systolic function (LVEF = 47%). There are no regional wall motion abnormalities.  3.  Normal left and right atrial size.  4. Normal size of the aortic root, ascending aorta and pulmonary artery.  5.  Mild mitral and trivial tricuspid regurgitation.  6.  Normal pericardium.  No pericardial effusion.  IMPRESSION: 1. Mildly dilated left ventricle with mild concentric hypertrophy and severely decreased systolic function (LVEF =). There is diffuse hypokinesis and pronounced paradoxical septal motion.  There is a ratio of non-compacted to compacted myocardium of > 2.5 in the apical segments. No late gadolinium enhancement is seen in the left ventricular myocardium.  2. Normal  right ventricular size, thickness and systolic function (LVEF = 47%). There are no regional wall  motion abnormalities.  3.  Normal left and right atrial size.  4. Normal size of the aortic root, ascending aorta and pulmonary artery.  5. Mild mitral and trivial tricuspid regurgitation.  6.  Normal pericardium.  No pericardial effusion.  There is no significant change in LVEF when compared to the prior echocardiogram from 09/19/2017. 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  ASSESSMENT:    1. NICM (nonischemic cardiomyopathy) (HCC)   2. LBBB (left bundle branch block)      PLAN:  In order of problems listed above:  1. NICM: On low-dose Entresto and spironolactone, for some reason, her carvedilol was discontinued.  I will restart carvedilol at 3.125 mg twice daily.  I will move up her next pharmacy visit to the end of this month in order to uptitrate Entresto and carvedilol further before her echocardiogram.  She was supposed to have echocardiogram earlier this week, however this was canceled and rescheduled for February.  She denies any recent dizziness, blurred vision or feeling of passing out.  If her EF remains low on the follow-up echocardiogram, she will need a CRT-D device.   -I was hesitant to increase Entresto today's patient had a history of hypotension and presyncope when her medication was adjusted too fast in the hospital.    Medication Adjustments/Labs and Tests Ordered: Current medicines are reviewed at length with the patient today.  Concerns regarding medicines are outlined above.  Medication changes, Labs and Tests ordered today are listed in the Patient Instructions below. Patient Instructions  Medication Instructions:  Restart Carvediolol medication twice daily If you need a refill on your cardiac medications before your next appointment, please call your pharmacy.   Lab work: BMP, MAG If you have labs (blood work) drawn today and your  tests are completely normal, you will receive your results only by: Marland Kitchen MyChart Message (if you have MyChart) OR . A paper copy in the mail If you have any lab test that is abnormal or we need to change your treatment, we will call you to review the results.  Testing/Procedures: NONE  Follow-Up: At [facility], you and your health needs are our priority.  As part of our continuing mission to provide you with exceptional heart care, we have created designated Provider Care Teams.  These Care Teams include your primary Cardiologist (physician) and Advanced Practice Providers (APPs -  Physician Assistants and Nurse Practitioners) who all work together to provide you with the care you need, when you need it. Marland Kitchen Keep follow up with Dr.Bensimhon. Marland Kitchen Keep follow up with HF Pharmacy   Any Other Special Instructions Will Be Listed Below (If Applicable). None      Ramond Dial, [place]  01/18/2018 9:04 AM    [facility] [address] [place], [place], [place]  [address] Phone: [phone]; Fax: [phone]

## 2018-01-22 NOTE — Progress Notes (Signed)
Thank you MCr 

## 2018-01-29 ENCOUNTER — Ambulatory Visit (HOSPITAL_COMMUNITY)
Admission: RE | Admit: 2018-01-29 | Discharge: 2018-01-29 | Disposition: A | Discharge status: Home / Self Care | Payer: BC Managed Care – PPO | Source: Ambulatory Visit | Attending: Internal Medicine | Admitting: Internal Medicine

## 2018-01-29 VITALS — BP 128/80 | HR 72 | Wt 164.2 lb

## 2018-01-29 DIAGNOSIS — R0981 Nasal congestion: Secondary | ICD-10-CM | POA: Diagnosis not present

## 2018-01-29 DIAGNOSIS — R05 Cough: Secondary | ICD-10-CM | POA: Diagnosis not present

## 2018-01-29 DIAGNOSIS — I5022 Chronic systolic (congestive) heart failure: Secondary | ICD-10-CM | POA: Diagnosis not present

## 2018-01-29 DIAGNOSIS — F1010 Alcohol abuse, uncomplicated: Secondary | ICD-10-CM | POA: Diagnosis not present

## 2018-01-29 DIAGNOSIS — I428 Other cardiomyopathies: Secondary | ICD-10-CM | POA: Diagnosis not present

## 2018-01-29 DIAGNOSIS — Z79899 Other long term (current) drug therapy: Secondary | ICD-10-CM | POA: Diagnosis not present

## 2018-01-29 DIAGNOSIS — I447 Left bundle-branch block, unspecified: Secondary | ICD-10-CM | POA: Insufficient documentation

## 2018-01-29 DIAGNOSIS — I959 Hypotension, unspecified: Secondary | ICD-10-CM | POA: Insufficient documentation

## 2018-01-29 LAB — BASIC METABOLIC PANEL
ANION GAP: 12 (ref 5–15)
BUN: 21 mg/dL (ref 8–23)
CHLORIDE: 101 mmol/L (ref 98–111)
CO2: 26 mmol/L (ref 22–32)
Calcium: 9.7 mg/dL (ref 8.9–10.3)
Creatinine, Ser: 1.06 mg/dL — ABNORMAL HIGH (ref 0.44–1.00)
GFR calc non Af Amer: 57 mL/min — ABNORMAL LOW (ref >60)
Glucose, Bld: 97 mg/dL (ref 70–99)
POTASSIUM: 4 mmol/L (ref 3.5–5.1)
Sodium: 139 mmol/L (ref 135–145)

## 2018-01-29 MED ORDER — SACUBITRIL-VALSARTAN 49-51 MG PO TABS: 1.0000 | ORAL_TABLET | Freq: Two times a day (BID) | ORAL | 5 refills | Status: DC

## 2018-01-29 NOTE — Progress Notes (Signed)
HF MD: Beverly Andrews  HPI: Beverly Andrews a 62 y.o.femalewith chronic systolic CHF, NICM, new LBBB, and hypotension limiting med titration.   Seen in hospital 09/18/17 with DOE and new LBBB. Echo with new systolic CHF as below. Cath with normal coronaries. Planned for med optimization and possible CRT device if EF does not improve. Referred to see CHF clinic with hypotension limiting med titration.   She presents todayfor pharmacist-led HF medication titration.At last HFpharmacyvisit on 11/6,her spironolactone was increased to 25 mg QPM and furosemide was increased to 40 mg daily.Overall, she feels the same since her last visit. She still has a slight cough/congestion but no fever/chills. Still has difficulty sleeping. Takes all medications as directed.   Shortness of breath/dyspnea on exertion? Yes - stable  Orthopnea/PND? Yes - stable (uses two pillows)  Edema? No   Lightheadedness/dizziness? Yes - stable   Daily weights at home? Yes - stable. Increased weight but not due to fluid retention  Blood pressure/heart rate monitoring at home? No - BP monitor at home broke  Following low-sodium/fluid-restricted diet? Yes   HF Medications: Carvedilol 3.125 mg PO BID Furosemide 40 mg PO daily Entresto 24/26 mg PO BID  KCl 20 meq PO daily Spironolactone 25 mg PO daily  Has the patient been experiencing any side effects to the medications prescribed? no   Does the patient have any problems obtaining medications due to transportation or finances?No- [employer]. Provided $10 copay card for Entresto.  Understanding of regimen:good Understanding of indications: good Potential of compliance:good Patient understands to avoid NSAIDs. Patient understands to avoid decongestants.   Pertinent Lab Values:  01/29/18: Serum creatinine 1.06, BUN 21, Potassium 4.0, Sodium 139  Vital Signs:  Weight: 164.2 lb (last clinicweight:158 lb, dry weight 153  lb)  Blood pressure: 128/80 mmHg   Heart rate: 72 bpm  ReDS clip - 42% >> 42% >> 37% today  Assessment: 1. Chronicsystolic CHF (EF20-25%), due toNICM (?LBBB induced CM/infiltrative/viral). NYHA classIIIsymptoms. - Volume status improved based on ReDS vest reading although weight still trending up (patient thinks this may be due to increase sugar intake)  - Will have her increase Entresto 49-51 mg BID - Continue carvedilol 3.125 mg BID, furosemide KCl 20 mEq daily, and spironolactone 25 mg daily  - Provided education/informational handout on over-the-counter medications for her nasal congestion - Basic disease state pathophysiology, medication indication, mechanism and side effects reviewed at length with patient and she verbalized understanding  2. LBBB - QRS 156 ms by EKG8/29/19 - If needs ICD, may benefit from CRT-D  3. ETOH abuse -Encouraged completecessation  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 Offered genetic screening, she wishes to think over and will call us back.  - No evidence of arrythmias thus far. Will follow closely for need for [facility] - Continue GDMT and titration as above, with Echo in December for ICD consideration  Plan: 1) Medication changes: Based on clinical presentation, vital signs and recent labs will increase Entresto to 49-51 mg BID 2) Labs: BMET today 3) Follow-up:Dr. Bensimhon + ECHO 03/26/18  Tyler Deis. Bonnye Fava, PharmD, BCPS, CPP Clinical Pharmacist Phone: [phone] 01/29/2018 2:06 PM

## 2018-01-29 NOTE — Patient Instructions (Signed)
It was great to see you today!  Please INCREASE your Entresto to 49-51 mg TWICE DAILY. With your current Entresto 24-26 mg tablets, you may take #2 TABLETS TWICE DAILY until you pick up the higher strength tablet.   Blood work today. We will call you with any changes.   Please keep your appointment with Dr. [name] on 03/26/18.

## 2018-02-13 ENCOUNTER — Ambulatory Visit: Payer: BC Managed Care – PPO

## 2018-02-26 ENCOUNTER — Other Ambulatory Visit (HOSPITAL_COMMUNITY): Payer: BC Managed Care – PPO

## 2018-03-26 ENCOUNTER — Encounter (HOSPITAL_COMMUNITY): Payer: Self-pay | Admitting: Internal Medicine

## 2018-03-26 ENCOUNTER — Ambulatory Visit (HOSPITAL_COMMUNITY)
Admission: RE | Admit: 2018-03-26 | Discharge: 2018-03-26 | Disposition: A | Discharge status: Home / Self Care | Payer: BC Managed Care – PPO | Source: Ambulatory Visit | Attending: Internal Medicine | Admitting: Internal Medicine

## 2018-03-26 ENCOUNTER — Other Ambulatory Visit: Payer: Self-pay

## 2018-03-26 ENCOUNTER — Ambulatory Visit (HOSPITAL_BASED_OUTPATIENT_CLINIC_OR_DEPARTMENT_OTHER)
Admission: RE | Admit: 2018-03-26 | Discharge: 2018-03-26 | Disposition: A | Discharge status: Home / Self Care | Payer: BC Managed Care – PPO | Source: Ambulatory Visit | Attending: Internal Medicine | Admitting: Internal Medicine

## 2018-03-26 VITALS — BP 108/76 | HR 79 | Wt 168.2 lb

## 2018-03-26 DIAGNOSIS — I428 Other cardiomyopathies: Secondary | ICD-10-CM | POA: Diagnosis not present

## 2018-03-26 DIAGNOSIS — R079 Chest pain, unspecified: Secondary | ICD-10-CM | POA: Diagnosis not present

## 2018-03-26 DIAGNOSIS — Z79899 Other long term (current) drug therapy: Secondary | ICD-10-CM | POA: Diagnosis not present

## 2018-03-26 DIAGNOSIS — I5022 Chronic systolic (congestive) heart failure: Secondary | ICD-10-CM | POA: Insufficient documentation

## 2018-03-26 DIAGNOSIS — Z87891 Personal history of nicotine dependence: Secondary | ICD-10-CM | POA: Diagnosis not present

## 2018-03-26 DIAGNOSIS — Z7901 Long term (current) use of anticoagulants: Secondary | ICD-10-CM | POA: Insufficient documentation

## 2018-03-26 DIAGNOSIS — I447 Left bundle-branch block, unspecified: Secondary | ICD-10-CM | POA: Insufficient documentation

## 2018-03-26 DIAGNOSIS — I509 Heart failure, unspecified: Secondary | ICD-10-CM | POA: Diagnosis not present

## 2018-03-26 DIAGNOSIS — M109 Gout, unspecified: Secondary | ICD-10-CM | POA: Diagnosis not present

## 2018-03-26 DIAGNOSIS — Z20828 Contact with and (suspected) exposure to other viral communicable diseases: Secondary | ICD-10-CM | POA: Diagnosis not present

## 2018-03-26 DIAGNOSIS — I429 Cardiomyopathy, unspecified: Secondary | ICD-10-CM | POA: Insufficient documentation

## 2018-03-26 DIAGNOSIS — F1010 Alcohol abuse, uncomplicated: Secondary | ICD-10-CM | POA: Diagnosis not present

## 2018-03-26 LAB — BASIC METABOLIC PANEL
Anion gap: 16 — ABNORMAL HIGH (ref 5–15)
BUN: 34 mg/dL — ABNORMAL HIGH (ref 8–23)
CALCIUM: 9.9 mg/dL (ref 8.9–10.3)
CO2: 23 mmol/L (ref 22–32)
CREATININE: 1.63 mg/dL — AB (ref 0.44–1.00)
Chloride: 96 mmol/L — ABNORMAL LOW (ref 98–111)
GFR calc Af Amer: 39 mL/min — ABNORMAL LOW (ref >60)
GFR calc non Af Amer: 34 mL/min — ABNORMAL LOW (ref >60)
Glucose, Bld: 114 mg/dL — ABNORMAL HIGH (ref 70–99)
Potassium: 4.8 mmol/L (ref 3.5–5.1)
Sodium: 135 mmol/L (ref 135–145)

## 2018-03-26 NOTE — Progress Notes (Signed)
Advanced Heart Failure Clinic Note  PCP: Deatra James, MD PCP-Cardiologist: Thurmon Fair, MD   HPI: Beverly Andrews is a 63 y.o. female with chronic systolic CHF, NICM, LBBB.  Seen in hospital 09/18/17 with DOE and new LBBB. Echo with new systolic CHF as below. Cath with normal coronaries.   Today she returns for HF follow up. Complaining of fatigue. Occasionally having twinges of chest pain. SOB with steps and with brisk walking. Denies PND/Orthopnea. No edema or CP. Appetite ok. Says she has been eating lots of junk food.  No fever or chills. Weight at home 167 pounds. She has started drinking vodka again. Drinking 3 drinks a day. Works 30-37/week hours at an [employer] .  ECHo 03/26/2018 EF 25-30%   Echo 09/19/17 LVEF 20-25%, LBBB, Mod central MR.   cMRI 10/24/17 with EF 20-25%, Normal RV, normal atria. Findings suspicious for non-compaction cardiomyopathy.   R/LHC 09/20/17 - Normal coronary arteries Hemodynamics (mmHg) RA mean 2 RV 31/0 PA 30/11 PCWP 11 AO 111/70 Cardiac Output (Fick) 5.72 Cardiac Index (Fick) 3.28  Review of systems complete and found to be negative unless listed in HPI.    Past Medical History:  Diagnosis Date  . Alcohol abuse   . Gout   . History of cardiac cath 09/24/2017   09/24/17: normal coronary arteries, EF <25%  . Hypertension   . Non-ischemic cardiomyopathy (HCC) 09/18/2017   09/18/17- EF 20-25%  . Recurrent pneumonia     Current Outpatient Medications  Medication Sig Dispense Refill  . albuterol (PROVENTIL HFA;VENTOLIN HFA) 108 (90 Base) MCG/ACT inhaler Inhale 1-2 puffs into the lungs every 6 (six) hours as needed for wheezing or shortness of breath.    . Biotin 10 MG TABS Take 10 mg by mouth daily.     . carvedilol (COREG) 3.125 MG tablet Take 1 tablet (3.125 mg total) by mouth 2 (two) times daily with a meal. 180 tablet 1  . cholecalciferol (VITAMIN D) 1000 units tablet Take 1,000 Units by mouth daily.    . colchicine 0.6 MG tablet Take 0.6  mg by mouth daily as needed (gout flare up).    . Collagen 500 MG CAPS Take 500 mg by mouth daily.     . diphenhydrAMINE (BENADRYL) 25 MG tablet Take 25 mg by mouth every 6 (six) hours as needed for sleep.     . furosemide (LASIX) 20 MG tablet Take 2 tablets (40 mg total) by mouth daily. Take an additional tablet if weight increases by 2-3 pounds. 60 tablet 5  . Multiple Vitamin (MULTIVITAMIN WITH MINERALS) TABS tablet Take 1 tablet by mouth daily. 90 tablet 3  . potassium chloride SA (K-DUR,KLOR-CON) 20 MEQ tablet Take 1 tablet (20 mEq total) by mouth daily. 30 tablet 5  . sacubitril-valsartan (ENTRESTO) 49-51 MG Take 1 tablet by mouth 2 (two) times daily. 60 tablet 5  . spironolactone (ALDACTONE) 25 MG tablet Take 1 tablet (25 mg total) by mouth daily. 90 tablet 3  . thiamine 100 MG tablet Take 1 tablet (100 mg total) by mouth daily. 30 tablet 5  . vitamin C (ASCORBIC ACID) 500 MG tablet Take 500 mg by mouth daily.     No current facility-administered medications for this encounter.     Allergies  Allergen Reactions  . Shellfish Allergy Swelling  . Amoxicillin Itching and Swelling  . Other Hives and Swelling    carbocaine  . Penicillins Itching and Swelling    Has patient had a PCN reaction causing immediate  rash, facial/tongue/throat swelling, SOB or lightheadedness with hypotension: Yes Has patient had a PCN reaction causing severe rash involving mucus membranes or skin necrosis: No Has patient had a PCN reaction that required hospitalization: No Has patient had a PCN reaction occurring within the last 10 years: Yes If all of the above answers are "NO", then may proceed with Cephalosporin use.       Social History   Socioeconomic History  . Marital status: Divorced    Spouse name: Not on file  . Number of children: Not on file  . Years of education: Not on file  . Highest education level: Not on file  Occupational History  . Occupation: Works at an [employer]    . Financial resource strain: Not very hard  . Food insecurity:    Worry: Never true    Inability: Never true  . Transportation needs:    Medical: No    Non-medical: No  Tobacco Use  . Smoking status: Former Games developer  . Smokeless tobacco: Never Used  . Tobacco comment: quit 6 months ago  Substance and Sexual Activity  . Alcohol use: Yes    Alcohol/week: 21.0 standard drinks    Types: 21 Shots of liquor per week  . Drug use: Never  . Sexual activity: Not on file  Lifestyle  . Physical activity:    Days per week: Not on file    Minutes per session: Not on file  . Stress: Not on file  Relationships  . Social connections:    Talks on phone: Not on file    Gets together: Not on file    Attends religious service: Not on file    Active member of club or organization: Not on file    Attends meetings of clubs or organizations: Not on file    Relationship status: Not on file  . Intimate partner violence:    Fear of current or ex partner: Not on file    Emotionally abused: Not on file    Physically abused: Not on file    Forced sexual activity: Not on file  Other Topics Concern  . Not on file  Social History Narrative  . Not on file      Family History  Problem Relation Age of Onset  . AAA (abdominal aortic aneurysm) Father   . Aortic stenosis Father     Vitals:   03/26/18 1413  BP: 108/76  Pulse: 79  SpO2: 100%  Weight: 76.3 kg (168 lb 3.2 oz)    Wt Readings from Last 3 Encounters:  03/26/18 76.3 kg (168 lb 3.2 oz)  01/29/18 74.5 kg (164 lb 3.2 oz)  01/18/18 73.5 kg (162 lb)    General:  Well appearing. No resp difficulty HEENT: normal Neck: supple. no JVD. Carotids 2+ bilat; no bruits. No lymphadenopathy or thryomegaly appreciated. Cor: PMI nondisplaced. Regular rate & rhythm. No rubs, gallops or murmurs. Lungs: clear Abdomen: soft, nontender, nondistended. No hepatosplenomegaly. No bruits or masses. Good bowel sounds. Extremities: no cyanosis, clubbing, rash,  edema Neuro: alert & orientedx3, cranial nerves grossly intact. moves all 4 extremities w/o difficulty. Affect pleasant   EKG: NSR 85 bpm LBBB QRS 144 ms   ASSESSMENT & PLAN:  1. 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 - cMRI 10/24/17 with EF 20-25%, Normal RV, normal atria. Findings suspicious for non-compaction cardiomyopathy. -Todays ECHO reviewed and discussed by Dr [name]. EF  25-30%. Refer to EP for CRT-D  - NYHA II-III. Need CPX to risk stratification.  - Volume status stable despite weight gain. Continue lasix 40 mg daily.   - Continue coreg 3.125 mg BID for now.  - Continue entresto 49-51 mg twice a day  - Continue spiro 25 mg daily.   - Check BMET   2. LBBB - QRS 144 ms today on EKG  -Refer to EP   3. ETOH abuse - She has started drinking alcohol again. Encouraged to stop drinking.   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 Offered genetic screening, she wishes to think over and will call us back.  - No evidence of arrythmias thus far. Will follow closely for need for [facility].  - Continue GDMT and titration as above, with Echo in December for ICD consideration.   Follow up in 6 weeks to discuss CPX results. BMET today.  Tonye Becket, NP 03/26/18   Overall stable NYHA II-III. Volume status ok. cmRI from 9/19 with ? Non-compaction vs LBBB CM. Echo today EF 30%. Will plan CPX to further risk stratify. Refer to EP for CRT-D. Consider Genetics referral at next visit. BP too low to titrate meds.   Arvilla Meres, MD  3:20 PM

## 2018-03-26 NOTE — Progress Notes (Signed)
  Echocardiogram 2D Echocardiogram has been performed.  Janalyn Harder 03/26/2018, 1:49 PM

## 2018-03-26 NOTE — Patient Instructions (Signed)
Lab work done today. We will notify you of any abnormal lab work.  EKG done today.  You have been referred to Electrophysiology at [facility]. They will contact you in order to schedule your appointment.  Your physician has recommended that you have a cardiopulmonary stress test (CPX). CPX testing is a non-invasive measurement of heart and lung function. It replaces a traditional treadmill stress test. This type of test provides a tremendous amount of information that relates not only to your present condition but also for future outcomes. This test combines measurements of you ventilation, respiratory gas exchange in the lungs, electrocardiogram (EKG), blood pressure and physical response before, during, and following an exercise protocol.  Follow up with our office in 6 weeks

## 2018-03-28 ENCOUNTER — Other Ambulatory Visit (HOSPITAL_COMMUNITY): Payer: Self-pay | Admitting: Cardiology

## 2018-03-28 ENCOUNTER — Encounter: Payer: Self-pay | Admitting: Internal Medicine

## 2018-04-03 ENCOUNTER — Telehealth (HOSPITAL_COMMUNITY): Payer: Self-pay

## 2018-04-03 NOTE — Telephone Encounter (Signed)
-----   Message from Dolores Patty, MD sent at 03/30/2018  1:47 PM EST ----- Creatinine up. Please recheck next week.

## 2018-04-03 NOTE — Telephone Encounter (Signed)
Creatinine 1.63  Elevated creatinine, pt made aware of results. Per Dr. [name], would like patient to repeat lab work this week.  Lab appointment made for this Friday Feb 28 3:15p.  Pt appreciative and amendable to plan

## 2018-04-06 ENCOUNTER — Ambulatory Visit (HOSPITAL_COMMUNITY)
Admission: RE | Admit: 2018-04-06 | Discharge: 2018-04-06 | Disposition: A | Discharge status: Home / Self Care | Payer: BC Managed Care – PPO | Source: Ambulatory Visit | Attending: Cardiology | Admitting: Cardiology

## 2018-04-06 DIAGNOSIS — I428 Other cardiomyopathies: Secondary | ICD-10-CM | POA: Diagnosis not present

## 2018-04-06 LAB — BASIC METABOLIC PANEL
Anion gap: 11 (ref 5–15)
BUN: 20 mg/dL (ref 8–23)
CO2: 24 mmol/L (ref 22–32)
Calcium: 9.7 mg/dL (ref 8.9–10.3)
Chloride: 102 mmol/L (ref 98–111)
Creatinine, Ser: 1.19 mg/dL — ABNORMAL HIGH (ref 0.44–1.00)
GFR calc Af Amer: 57 mL/min — ABNORMAL LOW (ref >60)
GFR calc non Af Amer: 49 mL/min — ABNORMAL LOW (ref >60)
Glucose, Bld: 107 mg/dL — ABNORMAL HIGH (ref 70–99)
Potassium: 4.3 mmol/L (ref 3.5–5.1)
Sodium: 137 mmol/L (ref 135–145)

## 2018-04-13 ENCOUNTER — Ambulatory Visit (HOSPITAL_COMMUNITY): Payer: BC Managed Care – PPO | Attending: Internal Medicine

## 2018-04-13 DIAGNOSIS — I5022 Chronic systolic (congestive) heart failure: Secondary | ICD-10-CM | POA: Insufficient documentation

## 2018-04-17 ENCOUNTER — Encounter: Payer: Self-pay | Admitting: Cardiology

## 2018-04-17 ENCOUNTER — Ambulatory Visit: Payer: BC Managed Care – PPO | Admitting: Cardiology

## 2018-04-17 VITALS — BP 122/62 | HR 84 | Ht 67.0 in | Wt 172.0 lb

## 2018-04-17 DIAGNOSIS — I5022 Chronic systolic (congestive) heart failure: Secondary | ICD-10-CM | POA: Diagnosis not present

## 2018-04-17 NOTE — Progress Notes (Signed)
Electrophysiology Office Note   Date:  04/17/2018   ID:  Beverly Andrews, DOB 23-Jun-1955, MRN [id number]  PCP:  Beverly James, MD  Cardiologist:  Beverly Andrews Primary Electrophysiologist:  Beverly Jorja Loa, MD    No chief complaint on file.    History of Present Illness: Beverly Andrews is a 63 y.o. female who is being seen today for the evaluation of CHF at the request of Beverly Andrews, Beverly Buckles, MD. Presenting today for electrophysiology evaluation.  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  She had a catheterization that showed normal coronary arteries.  Her main symptoms are fatigue and mild chest discomfort.  She does get short of breath with brisk walking.  She has no PND or orthopnea.  He had a cardiac MRI 08/23/2017 that showed an ejection fraction of 20 to 25% with a normal RV and normal atria.  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  Upon describing the procedure of an ICD implant, the patient became dizzy, clammy, weak, and fatigue.  She laid back on the bed and vomited.  This was a typical vagal reaction.  Today, she denies symptoms of palpitations, chest pain, shortness of breath, orthopnea, PND, lower extremity edema, claudication, dizziness, presyncope, syncope, bleeding, or neurologic sequela. The patient is tolerating medications without difficulties.    Past Medical History:  Diagnosis Date  . Alcohol abuse   . Gout   . History of cardiac cath 09/24/2017   09/24/17: normal coronary arteries, EF <25%  . Hypertension   . Non-ischemic cardiomyopathy (HCC) 09/18/2017   09/18/17- EF 20-25%  . Recurrent pneumonia    Past Surgical History:  Procedure Laterality Date  . KNEE ARTHROSCOPY    . RIGHT/LEFT HEART CATH AND CORONARY ANGIOGRAPHY N/A 09/20/2017   Procedure: RIGHT/LEFT HEART CATH AND CORONARY ANGIOGRAPHY;  Surgeon: Beverly Ouch, MD;  Location: [facility] INVASIVE CV [facility];   Service: Cardiovascular;  Laterality: N/A;  . TONSILLECTOMY       Current Outpatient Medications  Medication Sig Dispense Refill  . albuterol (PROVENTIL HFA;VENTOLIN HFA) 108 (90 Base) MCG/ACT inhaler Inhale 1-2 puffs into the lungs every 6 (six) hours as needed for wheezing or shortness of breath.    . Biotin 10 MG TABS Take 10 mg by mouth daily.     . carvedilol (COREG) 3.125 MG tablet Take 1 tablet (3.125 mg total) by mouth 2 (two) times daily with a meal. 180 tablet 1  . cholecalciferol (VITAMIN D) 1000 units tablet Take 1,000 Units by mouth daily.    . colchicine 0.6 MG tablet Take 0.6 mg by mouth daily as needed (gout flare up).    . Collagen 500 MG CAPS Take 500 mg by mouth daily.     . diphenhydrAMINE (BENADRYL) 25 MG tablet Take 25 mg by mouth every 6 (six) hours as needed for sleep.     . furosemide (LASIX) 20 MG tablet Take 2 tablets (40 mg total) by mouth daily. Take an additional tablet if weight increases by 2-3 pounds. 60 tablet 5  . Multiple Vitamin (MULTIVITAMIN WITH MINERALS) TABS tablet Take 1 tablet by mouth daily. 90 tablet 3  . potassium chloride SA (K-DUR,KLOR-CON) 20 MEQ tablet TAKE 1 TABLET BY MOUTH ONCE DAILY 90 tablet 3  . sacubitril-valsartan (ENTRESTO) 49-51 MG Take 1 tablet by mouth 2 (two) times daily. 60 tablet 5  . thiamine 100 MG tablet Take 1 tablet (100 mg total) by mouth daily. 30  tablet 5  . vitamin C (ASCORBIC ACID) 500 MG tablet Take 500 mg by mouth daily.    Marland Kitchen spironolactone (ALDACTONE) 25 MG tablet Take 1 tablet (25 mg total) by mouth daily. 90 tablet 3   No current facility-administered medications for this visit.     Allergies:   Shellfish allergy; Amoxicillin; Other; and Penicillins   Social History:  The patient  reports that she has quit smoking. She has never used smokeless tobacco. She reports current alcohol use of about 21.0 standard drinks of alcohol per week. She reports that she does not use drugs.   Family History:  The patient's  family history includes AAA (abdominal aortic aneurysm) in her father; Aortic stenosis in her father; Prostate cancer in her father.    ROS:  Please see the history of present illness.   Otherwise, review of systems is positive for chest pressure, palpitations, cough, shortness of breath, back pain, dizziness.   All other systems are reviewed and negative.    PHYSICAL EXAM: VS:  BP 122/62   Pulse 84   Ht 5\' 7"  (1.702 m)   Wt 172 lb (78 kg)   BMI 26.94 kg/m  , BMI Body mass index is 26.94 kg/m. GEN: Well nourished, well developed, in no acute distress  HEENT: normal  Neck: no JVD, carotid bruits, or masses Cardiac: RRR; no murmurs, rubs, or gallops,no edema  Respiratory:  clear to auscultation bilaterally, normal work of breathing GI: soft, nontender, nondistended, + BS MS: no deformity or atrophy  Skin: warm and dry Neuro:  Strength and sensation are intact Psych: euthymic mood, full affect  EKG:  EKG is ordered today. Personal review of the ekg ordered shows sinus rhythm, rate 84, nonspecific IVCD, septal Q waves  Recent Labs: 09/19/2017: B Natriuretic Peptide 1,338.8 09/25/2017: Hemoglobin 14.8; Platelets 165; TSH 2.571 04/06/2018: BUN 20; Creatinine, Ser 1.19; Potassium 4.3; Sodium 137    Lipid Panel     Component Value Date/Time   CHOL 186 09/20/2017 0413   TRIG 71 09/20/2017 0413   HDL 73 09/20/2017 0413   CHOLHDL 2.5 09/20/2017 0413   VLDL 14 09/20/2017 0413   LDLCALC 99 09/20/2017 0413     Wt Readings from Last 3 Encounters:  04/17/18 172 lb (78 kg)  03/26/18 168 lb 3.2 oz (76.3 kg)  01/29/18 164 lb 3.2 oz (74.5 kg)      Other studies Reviewed: Additional studies/ records that were reviewed today include: TTE 03/26/18  Review of the above records today demonstrates:   1. The left ventricle has a 3D calculated ejection fraction of 30%. The cavity size was normal. There is mild concentric left ventricular hypertrophy. Left ventricular diastolic Doppler  parameters are indeterminate The E/e' is 15.3. There is abnormal  septal motion consistent with left bundle branch block. Left ventricular diffuse hypokinesis.  2. The right ventricle has moderately reduced systolic function. The cavity was normal. There is no increase in right ventricular wall thickness. Right ventricular systolic pressure could not be assessed.  3. Trivial pericardial effusion is present.  4. The mitral valve is normal in structure. There is mild to moderate mitral annular calcification present.  5. The tricuspid valve is normal in structure.  6. The aortic valve is tricuspid.  7. Right atrial pressure is estimated at 3 mmHg.  CMRI 10/25/17 1. Mildly dilated left ventricle with mild concentric hypertrophy and severely decreased systolic function (LVEF =). There is diffuse hypokinesis and pronounced paradoxical septal motion. There is a ratio  of non-compacted to compacted myocardium of > 2.5 in the apical segments. No late gadolinium enhancement is seen in the left ventricular myocardium. 2. Normal right ventricular size, thickness and systolic function (LVEF = 47%). There are no regional wall motion abnormalities. 3.  Normal left and right atrial size. 4. Normal size of the aortic root, ascending aorta and pulmonary artery. 5. Mild mitral and trivial tricuspid regurgitation. 6.  Normal pericardium.  No pericardial effusion.  ASSESSMENT AND PLAN:  1.  Chronic systolic heart failure due to nonischemic cardiomyopathy.  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  Echo shows continued low ejection fraction.  On optimal medical therapy with Coreg, Entresto, and Aldactone.  I discussed the possibility of ICD implant.  Risks and benefits were discussed and include bleeding, tamponade, infection, pneumothorax.  She does have a left bundle branch block and thus would qualify for CRT.  She understands these risks and is  agreed to the procedure.  2.  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  She has been advised for genetic testing.  Current medicines are reviewed at length with the patient today.   The patient does not have concerns regarding her medicines.  The following changes were made today:  none  Labs/ tests ordered today include:  Orders Placed This Encounter  Procedures  . EKG 12-Lead   Discussed with primary cardiology  Disposition:   FU with Beverly Camnitz 3 months  Signed, Beverly Jorja Loa, MD  04/17/2018 9:15 AM     [facility] [address] [place] [address] [phone] (office) [phone] (fax)

## 2018-04-17 NOTE — Patient Instructions (Signed)
Medication Instructions:  Your physician recommends that you continue on your current medications as directed. Please refer to the Current Medication list given to you today.     * If you need a refill on your cardiac medications before your next appointment, please call your pharmacy. *   Labwork: None ordered * Will notify you of abnormal results, otherwise continue current treatment plan.*   Testing/Procedures: CRT or cardiac resynchronization therapy is a treatment used to correct heart failure. When you have heart failure your heart is weakened and doesn't pump as well as it should. This therapy may help reduce symptoms and improve the quality of life.  Please see the handout/brochure given to you today to get more information of the different options of therapy. Your physician has recommended that you have a defibrillator inserted. An implantable cardioverter defibrillator (ICD) is a small device that is placed in your chest or, in rare cases, your abdomen. This device uses electrical pulses or shocks to help control life-threatening, irregular heartbeats that could lead the heart to suddenly stop beating (sudden cardiac arrest). Leads are attached to the ICD that goes into your heart. This is done in the hospital and usually requires an overnight stay. Please follow the instructions below, located under the special instructions section.  The following dates are available (these are subject to change):        4/10, 4/15, 4/17, 4/22, 2/24, 4/29, 4/30, 5/5, 5/11, 5/13, 5/20, 5/22, 5/27, 5/29   Follow-Up: Your physician recommends that you schedule a wound check appointment 10-14 days, after your procedure on ______, with the device clinic.  Your physician recommends that you schedule a follow up appointment in 91 days, after your procedure on ________, with Dr. Elberta Fortis.  * Please note that any paperwork needing to be filled out by the provider will need to be addressed at the front desk  prior to seeing the provider.  Please note that any FMLA, disability or other documents regarding health condition is subject to a $25.00 charge that must be received prior to completion of paperwork in the form of a money order or check. *  Thank you for choosing [facility]!!   Dory Horn, RN [phone]   Any Other Special Instructions Will Be Listed Below (If Applicable).     Implantable Device Instructions  You are scheduled for:                  _____ Implantable Cardioverter Defibrillator  on  _______  with Dr. Elberta Fortis.  1.   Please arrive at the [facility], Entrance "A"  at [facility] at  _______ on the day of your procedure. (The address is [address])  2. Do not eat or drink after midnight the night before your procedure.  3.   Complete pre procedure  lab work on _______.  The lab at [facility] is open from 8:00 AM to 4:30 PM.  You do not have to be fasting.  4.   Nurse will give you medication instructions once procedure is scheduled.  5.  Plan for an overnight stay.  Bring your insurance cards and a list of you medications.  6.  Wash your chest and neck with surgical scrub the evening before and the morning of  your procedure.  Rinse well. Please review the surgical scrub instruction sheet given to you.                                                                                                     *  If you have ANY questions after you get home, please call Dory Horn, RN @ [phone].  * Every attempt is made to prevent procedures from being rescheduled.  Due to the nature of  Electrophysiology, rescheduling can happen.  The physician is always aware and directs the staff when this occurs.        Cardioverter Defibrillator Implantation An implantable cardioverter defibrillator (ICD) is a small, lightweight, battery-powered device that is placed (implanted) under the skin in the chest or abdomen. Your  caregiver may prescribe an ICD if:  You have had an irregular heart rhythm (arrhythmia) that originated in the lower chambers of the heart (ventricles).  Your heart has been damaged by a disease (such as coronary artery disease) or heart condition (such as a heart attack). An ICD consists of a battery that lasts several years, a small computer called a pulse generator, and wires called leads that go into the heart. It is used to detect and correct two dangerous arrhythmias: a rapid heart rhythm (tachycardia) and an arrhythmia in which the ventricles contract in an uncoordinated way (fibrillation). When an ICD detects tachycardia, it sends an electrical signal to the heart that restores the heartbeat to normal (cardioversion). This signal is usually painless. If cardioversion does not work or if the ICD detects fibrillation, it delivers a small electrical shock to the heart (defibrillation) to restart the heart. The shock may feel like a strong jolt in the chest. ICDs may be programmed to correct other problems. Sometimes, ICDs are programmed to act as another type of implantable device called a pacemaker. Pacemakers are used to treat a slow heartbeat (bradycardia). LET YOUR CAREGIVER KNOW ABOUT:  Any allergies you have.  All medicines you are taking, including vitamins, herbs, eyedrops, and over-the-counter medicines and creams.  Previous problems you or members of your family have had with the use of anesthetics.  Any blood disorders you have had.  Other health problems you have. RISKS AND COMPLICATIONS Generally, the procedure to implant an ICD is safe. However, as with any surgical procedure, complications can occur. Possible complications associated with implanting an ICD include:  Swelling, bleeding, or bruising at the site where the ICD was implanted.  Infection at the site where the ICD was implanted.  A reaction to medicine used during the procedure.  Nerve, heart, or blood vessel  damage.  Blood clots. BEFORE THE PROCEDURE  You may need to have blood tests, heart tests, or a chest X-ray done before the day of the procedure.  Ask your caregiver about changing or stopping your regular medicines.  Make plans to have someone drive you home. You may need to stay in the hospital overnight after the procedure.  Stop smoking at least 24 hours before the procedure.  Take a bath or shower the night before the procedure. You may need to scrub your chest or abdomen with a special type of soap.  Do not eat or drink before your procedure for as long as directed by your caregiver. Ask if it is okay to take any needed medicine with a small sip of water. PROCEDURE  The procedure to implant an ICD in your chest or abdomen is usually done at a hospital in a room that has a large X-ray machine called a fluoroscope. The machine will be above you during the procedure. It will help your caregiver see your heart during the procedure. Implanting an ICD usually takes 1-3 hours. Before the procedure:   Small monitors will be  put on your body. They will be used to check your heart, blood pressure, and oxygen level.  A needle will be put into a vein in your hand or arm. This is called an intravenous (IV) access tube. Fluids and medicine will flow directly into your body through the IV tube.  Your chest or abdomen will be cleaned with a germ-killing (antiseptic) solution. The area may be shaved.  You may be given medicine to help you relax (sedative).  You will be given a medicine called a local anesthetic. This medicine will make the surgical site numb while the ICD is implanted. You will be sleepy but awake during the procedure. After you are numb the procedure will begin. The caregiver will:  Make a small cut (incision). This will make a pocket deep under your skin that will hold the pulse generator.  Guide the leads through a large blood vessel into your heart and attach them to the  heart muscles. Depending on the ICD, the leads may go into one ventricle or they may go to both ventricles and into an upper chamber of the heart (atrium).  Test the ICD.  Close the incision with stitches, glue, or staples. AFTER THE PROCEDURE  You may feel pain. Some pain is normal. It may last a few days.  You may stay in a recovery area until the local anesthetic has worn off. Your blood pressure and pulse will be checked often. You will be taken to a room where your heart will be monitored.  A chest X-ray will be taken. This is done to check that the cardioverter defibrillator is in the right place.  You may stay in the hospital overnight.  A slight bump may be seen over the skin where the ICD was placed. Sometimes, it is possible to feel the ICD under the skin. This is normal.  In the months and years afterward, your caregiver will check the device, the leads, and the battery every few months. Eventually, when the battery is low, the ICD will be replaced.   This information is not intended to replace advice given to you by your health care provider. Make sure you discuss any questions you have with your health care provider.   Document Released: 10/16/2001 Document Revised: 11/14/2012 Document Reviewed: 02/13/2012 Elsevier Interactive Patient Education 2016 [employer]      Cardioverter Defibrillator Implantation, Care After This sheet gives you information about how to care for yourself after your procedure. Your health care provider may also give you more specific instructions. If you have problems or questions, contact your health care provider. What can I expect after the procedure? After the procedure, it is common to have:  Some pain. It may last a few days.  A slight bump over the skin where the device was placed. Sometimes, it is possible to feel the device under the skin. This is normal.  During the months and years after your procedure, your health care provider  will check the device, the leads, and the battery every few months. Eventually, when the battery is low, the device will be replaced. Follow these instructions at home: Medicines  Take over-the-counter and prescription medicines only as told by your health care provider.  If you were prescribed an antibiotic medicine, take it as told by your health care provider. Do not stop taking the antibiotic even if you start to feel better. Incision care   Follow instructions from your health care provider about how to take care of  your incision area. Make sure you: ? Wash your hands with soap and water before you change your bandage (dressing). If soap and water are not available, use hand sanitizer. ? Change your dressing as told by your health care provider. ? Leave stitches (sutures), skin glue, or adhesive strips in place. These skin closures may need to stay in place for 2 weeks or longer. If adhesive strip edges start to loosen and curl up, you may trim the loose edges. Do not remove adhesive strips completely unless your health care provider tells you to do that.  Check your incision area every day for signs of infection. Check for: ? More redness, swelling, or pain. ? More fluid or blood. ? Warmth. ? Pus or a bad smell.  Do not use lotions or ointments near the incision area unless told by your health care provider.  Keep the incision area clean and dry for 2-3 days after the procedure or for as long as told by your health care provider. It takes several weeks for the incision site to heal completely.  Do not take baths, swim, or use a hot tub until your health care provider approves. Activity  Try to walk a little every day. Exercising is important after this procedure. Also, use your shoulder on the side of the defibrillator in daily tasks that do not require a lot of motion.  For at least 6 weeks: ? Do not lift your upper arm above your shoulders. This means no tennis, golf, or  swimming for this period of time. If you tend to sleep with your arm above your head, use a restraint to prevent this during sleep. ? Avoid sudden jerking, pulling, or chopping movements that pull your upper arm far away from your body.  Ask your health care provider when you may go back to work.  Check with your health care provider before you start to drive or play sports. Electric and magnetic fields  Tell all health care providers that you have a defibrillator. This may prevent them from giving you an MRI scan because strong magnets are used for that test.  If you must pass through a metal detector, quickly walk through it. Do not stop under the detector, and do not stand near it.  Avoid places or objects that have a strong electric or magnetic field, including: ? [employer]. At the airport, let officials know that you have a defibrillator. Your defibrillator ID card will let you be checked in a way that is safe for you and will not damage your defibrillator. Also, do not let a security person wave a magnetic wand near your defibrillator. That can make it stop working. ? Power plants. ? Large electrical generators. ? Anti-theft systems or electronic article surveillance (EAS). ? Radiofrequency transmission towers, such as cell phone and radio towers.  Do not use amateur (ham) radio equipment or electric (arc) welding torches. Some devices are safe to use if held at least 12 inches (30 cm) from your defibrillator. These include power tools, lawn mowers, and speakers. If you are unsure if something is safe to use, ask your health care provider.  Do not use MP3 player headphones. They have magnets.  You may safely use electric blankets, heating pads, computers, and microwave ovens.  When using your cell phone, hold it to the ear that is on the opposite side from the defibrillator. Do not leave your cell phone in a pocket over the defibrillator. General instructions  Follow  diet instructions from your health care provider, if this applies.  Always keep your defibrillator ID card with you. The card should list the implant date, device model, and manufacturer. Consider wearing a medical alert bracelet or necklace.  Have your defibrillator checked every 3-6 months or as often as told by your health care provider. Most defibrillators last for 4-8 years.  Keep all follow-up visits as told by your health care provider. This is important for your health care provider to make sure your chest is healing the way it should. Ask your health care provider when you should come back to have your stitches or staples taken out. Contact a health care provider if:  You feel one shock in your chest.  You gain weight suddenly.  Your legs or feet swell more than they have before.  It feels like your heart is fluttering or skipping beats (heart palpitations).  You have more redness, swelling, or pain around your incision.  You have more fluid or blood coming from your incision.  Your incision feels warm to the touch.  You have pus or a bad smell coming from your incision.  You have a fever. Get help right away if:  You have chest pain.  You feel more than one shock.  You feel more short of breath than you have felt before.  You feel more light-headed than you have felt before.  Your incision starts to open up. This information is not intended to replace advice given to you by your health care provider. Make sure you discuss any questions you have with your health care provider. Document Released: 08/13/2004 Document Revised: 08/14/2015 Document Reviewed: 07/01/2015 Elsevier Interactive Patient Education  2018 [employer].     Supplemental Discharge Instructions for  Pacemaker/Defibrillator Patients  ACTIVITY No heavy lifting or vigorous activity with your left/right arm for 6 to 8 weeks.  Do not raise your left/right arm above your head for one week.   Gradually raise your affected arm as drawn below.           __  NO DRIVING for     ; you may begin driving on     .  WOUND CARE - Keep the wound area clean and dry.  Do not get this area wet for one week. No showers for one week; you may shower on     . - The tape/steri-strips on your wound will fall off; do not pull them off.  No bandage is needed on the site.  DO  NOT apply any creams, oils, or ointments to the wound area. - If you notice any drainage or discharge from the wound, any swelling or bruising at the site, or you develop a fever > 101? F after you are discharged home, call the office at once.  SPECIAL INSTRUCTIONS - You are still able to use cellular telephones; use the ear opposite the side where you have your pacemaker/defibrillator.  Avoid carrying your cellular phone near your device. - When traveling through airports, show security personnel your identification card to avoid being screened in the metal detectors.  Ask the security personnel to use the hand wand. - Avoid arc welding equipment, MRI testing (magnetic resonance imaging), TENS units (transcutaneous nerve stimulators).  Call the office for questions about other devices. - Avoid electrical appliances that are in poor condition or are not properly grounded. - Microwave ovens are safe to be near or to operate.  ADDITIONAL INFORMATION FOR DEFIBRILLATOR PATIENTS SHOULD YOUR DEVICE  GO OFF: - If your device goes off ONCE and you feel fine afterward, notify the device clinic nurses. - If your device goes off ONCE and you do not feel well afterward, call 911. - If your device goes off TWICE, call 911. - If your device goes off THREE TIMES IN ONE DAY, call 911.  DO NOT DRIVE YOURSELF OR A FAMILY MEMBER WITH A DEFIBRILLATOR TO THE HOSPITAL-CALL 911.

## 2018-05-29 ENCOUNTER — Other Ambulatory Visit (HOSPITAL_COMMUNITY): Payer: Self-pay | Admitting: Internal Medicine

## 2018-08-22 ENCOUNTER — Telehealth: Payer: Self-pay | Admitting: Cardiology

## 2018-08-22 NOTE — Telephone Encounter (Signed)
New message   Patient wants to discuss having a defibrillator scheduled to be put in. Please advise.

## 2018-08-23 NOTE — Telephone Encounter (Signed)
Attempted to return pt call, unable to lmtcb as voicemail full.

## 2018-08-29 ENCOUNTER — Other Ambulatory Visit (HOSPITAL_COMMUNITY): Payer: Self-pay | Admitting: Internal Medicine

## 2018-09-04 ENCOUNTER — Telehealth: Payer: Self-pay | Admitting: Cardiology

## 2018-09-04 ENCOUNTER — Other Ambulatory Visit (HOSPITAL_COMMUNITY): Payer: Self-pay | Admitting: *Deleted

## 2018-09-04 MED ORDER — ENTRESTO 49-51 MG PO TABS: 1.0000 | ORAL_TABLET | Freq: Two times a day (BID) | ORAL | 2 refills | Status: DC

## 2018-09-04 NOTE — Telephone Encounter (Signed)
Attempted to return pt call.  No answer, voicemail full, unable to leave a message.   

## 2018-09-04 NOTE — Telephone Encounter (Signed)
°*  STAT* If patient is at the pharmacy, call can be transferred to refill team.   1. Which medications need to be refilled? (please list name of each medication and dose if known) Entresto 51 mg 2. Which pharmacy/location (including street and city if local pharmacy) is medication to be sent to? [facility] [address]  3. Do they need a 30 day or 90 day supply? [place]

## 2018-09-04 NOTE — Telephone Encounter (Signed)
Follow Up   Patient is calling back about getting the defibrillator scheduled. Please give patient a call back.

## 2018-09-06 NOTE — Telephone Encounter (Signed)
Follow Up  Patient returning call about getting the defibrillator scheduled. Please give patient a call back.

## 2018-09-07 NOTE — Telephone Encounter (Signed)
Attempted to return pt call.  No answer, voicemail full, unable to leave a message.

## 2018-09-13 ENCOUNTER — Telehealth: Payer: Self-pay | Admitting: Cardiology

## 2018-09-13 ENCOUNTER — Encounter: Payer: Self-pay | Admitting: *Deleted

## 2018-09-13 NOTE — Telephone Encounter (Signed)
New message   Patient is calling to set up a time to have a defibrillator put in. Please call.

## 2018-09-13 NOTE — Telephone Encounter (Signed)
Returned pt call. She would like procedure on 9/25. Aware I will hold that date for her. Scheduled pt for 9/8 OV for H&P and labs. Patient verbalized understanding and agreeable to plan.

## 2018-09-13 NOTE — Telephone Encounter (Signed)
This encounter was created in error - please disregard.

## 2018-09-13 NOTE — Telephone Encounter (Signed)
Attempted to return call again.  No answer, unable to leave a message

## 2018-09-24 ENCOUNTER — Ambulatory Visit: Payer: BC Managed Care – PPO | Admitting: Cardiology

## 2018-10-16 ENCOUNTER — Other Ambulatory Visit: Payer: Self-pay

## 2018-10-16 ENCOUNTER — Ambulatory Visit: Payer: BC Managed Care – PPO | Admitting: Cardiology

## 2018-10-16 ENCOUNTER — Encounter: Payer: Self-pay | Admitting: Cardiology

## 2018-10-16 VITALS — BP 114/80 | HR 73 | Ht 67.0 in | Wt 169.4 lb

## 2018-10-16 DIAGNOSIS — I428 Other cardiomyopathies: Secondary | ICD-10-CM | POA: Diagnosis not present

## 2018-10-16 DIAGNOSIS — Z01812 Encounter for preprocedural laboratory examination: Secondary | ICD-10-CM

## 2018-10-16 NOTE — Patient Instructions (Signed)
Medication Instructions:  Your physician recommends that you continue on your current medications as directed. Please refer to the Current Medication list given to you today.     * If you need a refill on your cardiac medications before your next appointment, please call your pharmacy. *   Labwork: Pre procedure lab work today: BMET & CBC If you have labs (blood work) drawn today and your tests are completely normal, you will receive your results only by:  [employer] (if you have MyChart) OR  A paper copy in the mail If you have any lab test that is abnormal or we need to change your treatment, we will call you to review the results.   Testing/Procedures: Your physician has recommended that you have a defibrillator inserted. An implantable cardioverter defibrillator (ICD) is a small device that is placed in your chest or, in rare cases, your abdomen. This device uses electrical pulses or shocks to help control life-threatening, irregular heartbeats that could lead the heart to suddenly stop beating (sudden cardiac arrest). Leads are attached to the ICD that goes into your heart. This is done in the hospital and usually requires an overnight stay. Please follow the instructions below, located under the special instructions section.  Follow-Up: Your physician recommends that you schedule a wound check appointment 10-14 days, after your procedure on 11/02/18, with the device clinic.  Your physician recommends that you schedule a follow up appointment in 91 days, after your procedure on 11/02/18, with Dr. Elberta Fortis.  * Please note that any paperwork needing to be filled out by the provider will need to be addressed at the front desk prior to seeing the provider.  Please note that any FMLA, disability or other documents regarding health condition is subject to a $25.00 charge that must be received prior to completion of paperwork in the form of a money order or check. *  Thank you for choosing  [facility]!!   Dory Horn, RN [phone]   Any Other Special Instructions Will Be Listed Below (If Applicable).     Implantable Device Instructions   Implantable Device Instructions  You are scheduled for: Defibrillator implant on 11/02/18 with Dr. Elberta Fortis.  1.   Pre procedure testing-             A.  LAB WORK--- On 10/16/18. You do not need to be fasting.               B. COVID TEST-- On 10/30/18 @ 2:45 pm - You will go to [facility] ([address] [place], [place]) for your Covid testing.   This is a drive thru test site.  There will be multiple testing areas.  Be sure to share with the first checkpoint that you are there for pre-procedure/surgery testing. This will put you into the right (yellow) lane that leads to the PAT testing team.   Stay in your car and the nurse team will come to your car to test you.  After you are tested please go home and self quarantine until the day of your procedure.    2. On the day of your procedure 11/02/18 you will go to [facility] [id number]) at 5:30 am.  Bonita Quin will go to the main entrance [employer]) and enter where the [employer] are.  You will check in at ADMITTING.  You may have one support person come in to the hospital with you.  They will be asked to  wait in the waiting room.   3.   Do not eat or drink after midnight prior to your procedure.   4.   On the morning of your procedure do NOT take any medication.  5.  The night before your procedure and the morning of your procedure scrub your neck/chest with surgical scrub.  An instruction letter is included with this letter.     5.  Plan for an overnight stay.  If you use your phone frequently bring your phone charger.  When you are discharged you will need someone to drive you home.   6.  You will follow up with the [facility] Device clinic 10-14 days after your procedure. You will follow up with Dr. Elberta Fortis 91 days after your procedure.  These  appointments will be made for you.   * If you have ANY questions after you get home, please call the office [phone] and ask for  RN or send a MyChart message.       Cardioverter Defibrillator Implantation An implantable cardioverter defibrillator (ICD) is a small, lightweight, battery-powered device that is placed (implanted) under the skin in the chest or abdomen. Your caregiver may prescribe an ICD if:  You have had an irregular heart rhythm (arrhythmia) that originated in the lower chambers of the heart (ventricles).  Your heart has been damaged by a disease (such as coronary artery disease) or heart condition (such as a heart attack). An ICD consists of a battery that lasts several years, a small computer called a pulse generator, and wires called leads that go into the heart. It is used to detect and correct two dangerous arrhythmias: a rapid heart rhythm (tachycardia) and an arrhythmia in which the ventricles contract in an uncoordinated way (fibrillation). When an ICD detects tachycardia, it sends an electrical signal to the heart that restores the heartbeat to normal (cardioversion). This signal is usually painless. If cardioversion does not work or if the ICD detects fibrillation, it delivers a small electrical shock to the heart (defibrillation) to restart the heart. The shock may feel like a strong jolt in the chest. ICDs may be programmed to correct other problems. Sometimes, ICDs are programmed to act as another type of implantable device called a pacemaker. Pacemakers are used to treat a slow heartbeat (bradycardia). LET YOUR CAREGIVER KNOW ABOUT:  Any allergies you have.  All medicines you are taking, including vitamins, herbs, eyedrops, and over-the-counter medicines and creams.  Previous problems you or members of your family have had with the use of anesthetics.  Any blood disorders you have had.  Other health problems you have. RISKS AND COMPLICATIONS  Generally, the procedure to implant an ICD is safe. However, as with any surgical procedure, complications can occur. Possible complications associated with implanting an ICD include:  Swelling, bleeding, or bruising at the site where the ICD was implanted.  Infection at the site where the ICD was implanted.  A reaction to medicine used during the procedure.  Nerve, heart, or blood vessel damage.  Blood clots. BEFORE THE PROCEDURE  You may need to have blood tests, heart tests, or a chest X-ray done before the day of the procedure.  Ask your caregiver about changing or stopping your regular medicines.  Make plans to have someone drive you home. You may need to stay in the hospital overnight after the procedure.  Stop smoking at least 24 hours before the procedure.  Take a bath or shower the night before the procedure. You  may need to scrub your chest or abdomen with a special type of soap.  Do not eat or drink before your procedure for as long as directed by your caregiver. Ask if it is okay to take any needed medicine with a small sip of water. PROCEDURE  The procedure to implant an ICD in your chest or abdomen is usually done at a hospital in a room that has a large X-ray machine called a fluoroscope. The machine will be above you during the procedure. It will help your caregiver see your heart during the procedure. Implanting an ICD usually takes 1-3 hours. Before the procedure:   Small monitors will be put on your body. They will be used to check your heart, blood pressure, and oxygen level.  A needle will be put into a vein in your hand or arm. This is called an intravenous (IV) access tube. Fluids and medicine will flow directly into your body through the IV tube.  Your chest or abdomen will be cleaned with a germ-killing (antiseptic) solution. The area may be shaved.  You may be given medicine to help you relax (sedative).  You will be given a medicine called a local  anesthetic. This medicine will make the surgical site numb while the ICD is implanted. You will be sleepy but awake during the procedure. After you are numb the procedure will begin. The caregiver will:  Make a small cut (incision). This will make a pocket deep under your skin that will hold the pulse generator.  Guide the leads through a large blood vessel into your heart and attach them to the heart muscles. Depending on the ICD, the leads may go into one ventricle or they may go to both ventricles and into an upper chamber of the heart (atrium).  Test the ICD.  Close the incision with stitches, glue, or staples. AFTER THE PROCEDURE  You may feel pain. Some pain is normal. It may last a few days.  You may stay in a recovery area until the local anesthetic has worn off. Your blood pressure and pulse will be checked often. You will be taken to a room where your heart will be monitored.  A chest X-ray will be taken. This is done to check that the cardioverter defibrillator is in the right place.  You may stay in the hospital overnight.  A slight bump may be seen over the skin where the ICD was placed. Sometimes, it is possible to feel the ICD under the skin. This is normal.  In the months and years afterward, your caregiver will check the device, the leads, and the battery every few months. Eventually, when the battery is low, the ICD will be replaced.   This information is not intended to replace advice given to you by your health care provider. Make sure you discuss any questions you have with your health care provider.   Document Released: 10/16/2001 Document Revised: 11/14/2012 Document Reviewed: 02/13/2012 Elsevier Interactive Patient Education 2016 [employer] Inc[employer]    Cardioverter Defibrillator Implantation, Care After This sheet gives you information about how to care for yourself after your procedure. Your health care provider may also give you more specific instructions. If you  have problems or questions, contact your health care provider. What can I expect after the procedure? After the procedure, it is common to have:  Some pain. It may last a few days.  A slight bump over the skin where the device was placed. Sometimes, it is possible to  feel the device under the skin. This is normal.  During the months and years after your procedure, your health care provider will check the device, the leads, and the battery every few months. Eventually, when the battery is low, the device will be replaced. Follow these instructions at home: Medicines  Take over-the-counter and prescription medicines only as told by your health care provider.  If you were prescribed an antibiotic medicine, take it as told by your health care provider. Do not stop taking the antibiotic even if you start to feel better. Incision care   Follow instructions from your health care provider about how to take care of your incision area. Make sure you: ? Wash your hands with soap and water before you change your bandage (dressing). If soap and water are not available, use hand sanitizer. ? Change your dressing as told by your health care provider. ? Leave stitches (sutures), skin glue, or adhesive strips in place. These skin closures may need to stay in place for 2 weeks or longer. If adhesive strip edges start to loosen and curl up, you may trim the loose edges. Do not remove adhesive strips completely unless your health care provider tells you to do that.  Check your incision area every day for signs of infection. Check for: ? More redness, swelling, or pain. ? More fluid or blood. ? Warmth. ? Pus or a bad smell.  Do not use lotions or ointments near the incision area unless told by your health care provider.  Keep the incision area clean and dry for 2-3 days after the procedure or for as long as told by your health care provider. It takes several weeks for the incision site to heal completely.   Do not take baths, swim, or use a hot tub until your health care provider approves. Activity  Try to walk a little every day. Exercising is important after this procedure. Also, use your shoulder on the side of the defibrillator in daily tasks that do not require a lot of motion.  For at least 6 weeks: ? Do not lift your upper arm above your shoulders. This means no tennis, golf, or swimming for this period of time. If you tend to sleep with your arm above your head, use a restraint to prevent this during sleep. ? Avoid sudden jerking, pulling, or chopping movements that pull your upper arm far away from your body.  Ask your health care provider when you may go back to work.  Check with your health care provider before you start to drive or play sports. Electric and magnetic fields  Tell all health care providers that you have a defibrillator. This may prevent them from giving you an MRI scan because strong magnets are used for that test.  If you must pass through a metal detector, quickly walk through it. Do not stop under the detector, and do not stand near it.  Avoid places or objects that have a strong electric or magnetic field, including: ? [employer]. At the airport, let officials know that you have a defibrillator. Your defibrillator ID card will let you be checked in a way that is safe for you and will not damage your defibrillator. Also, do not let a security person wave a magnetic wand near your defibrillator. That can make it stop working. ? Power plants. ? Large electrical generators. ? Anti-theft systems or electronic article surveillance (EAS). ? Radiofrequency transmission towers, such as cell phone and radio towers.  Do not use amateur (ham) radio equipment or electric (arc) welding torches. Some devices are safe to use if held at least 12 inches (30 cm) from your defibrillator. These include power tools, lawn mowers, and speakers. If you are unsure if  something is safe to use, ask your health care provider.  Do not use MP3 player headphones. They have magnets.  You may safely use electric blankets, heating pads, computers, and microwave ovens.  When using your cell phone, hold it to the ear that is on the opposite side from the defibrillator. Do not leave your cell phone in a pocket over the defibrillator. General instructions  Follow diet instructions from your health care provider, if this applies.  Always keep your defibrillator ID card with you. The card should list the implant date, device model, and manufacturer. Consider wearing a medical alert bracelet or necklace.  Have your defibrillator checked every 3-6 months or as often as told by your health care provider. Most defibrillators last for 4-8 years.  Keep all follow-up visits as told by your health care provider. This is important for your health care provider to make sure your chest is healing the way it should. Ask your health care provider when you should come back to have your stitches or staples taken out. Contact a health care provider if:  You feel one shock in your chest.  You gain weight suddenly.  Your legs or feet swell more than they have before.  It feels like your heart is fluttering or skipping beats (heart palpitations).  You have more redness, swelling, or pain around your incision.  You have more fluid or blood coming from your incision.  Your incision feels warm to the touch.  You have pus or a bad smell coming from your incision.  You have a fever. Get help right away if:  You have chest pain.  You feel more than one shock.  You feel more short of breath than you have felt before.  You feel more light-headed than you have felt before.  Your incision starts to open up. This information is not intended to replace advice given to you by your health care provider. Make sure you discuss any questions you have with your health care provider.  Document Released: 08/13/2004 Document Revised: 08/14/2015 Document Reviewed: 07/01/2015 Elsevier Interactive Patient Education  2018 [employer]     Supplemental Discharge Instructions for  Pacemaker/Defibrillator Patients  ACTIVITY No heavy lifting or vigorous activity with your left/right arm for 6 to 8 weeks.  Do not raise your left/right arm above your head for one week.  Gradually raise your affected arm as drawn below.           __  NO DRIVING for     ; you may begin driving on     .  WOUND CARE - Keep the wound area clean and dry.  Do not get this area wet for one week. No showers for one week; you may shower on     . - The tape/steri-strips on your wound will fall off; do not pull them off.  No bandage is needed on the site.  DO  NOT apply any creams, oils, or ointments to the wound area. - If you notice any drainage or discharge from the wound, any swelling or bruising at the site, or you develop a fever > 101? F after you are discharged home, call the office at once.  SPECIAL INSTRUCTIONS - You are still  able to use cellular telephones; use the ear opposite the side where you have your pacemaker/defibrillator.  Avoid carrying your cellular phone near your device. - When traveling through airports, show security personnel your identification card to avoid being screened in the metal detectors.  Ask the security personnel to use the hand wand. - Avoid arc welding equipment, MRI testing (magnetic resonance imaging), TENS units (transcutaneous nerve stimulators).  Call the office for questions about other devices. - Avoid electrical appliances that are in poor condition or are not properly grounded. - Microwave ovens are safe to be near or to operate.  ADDITIONAL INFORMATION FOR DEFIBRILLATOR PATIENTS SHOULD YOUR DEVICE GO OFF: - If your device goes off ONCE and you feel fine afterward, notify the device clinic nurses. - If your device goes off ONCE and you do not feel well  afterward, call 911. - If your device goes off TWICE, call 911. - If your device goes off [place], call 911.  DO NOT DRIVE YOURSELF OR A FAMILY MEMBER WITH A DEFIBRILLATOR TO THE HOSPITAL-CALL 911.

## 2018-10-16 NOTE — Progress Notes (Addendum)
Electrophysiology Office Note   Date:  10/16/2018   ID:  Beverly Andrews, DOB 05-24-1955, MRN [id number]  PCP:  Deatra JamesSun, Vyvyan, MD  Cardiologist:  Bensimhon Primary Electrophysiologist:  Noami Bove Jorja LoaMartin Brixton Franko, MD    No chief complaint on file.    History of Present Illness: Beverly ReekSusan Andrews is a 63 y.o. female who is being seen today for the evaluation of CHF at the request of Deatra JamesSun, Vyvyan, MD. Presenting today for electrophysiology evaluation.  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  She had a catheterization that showed normal coronary arteries.  Her main symptoms are fatigue and mild chest discomfort.  She does get short of breath with brisk walking.  She has no PND or orthopnea.  He had a cardiac MRI 08/23/2017 that showed an ejection fraction of 20 to 25% with a normal RV and normal atria.  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  Today, denies symptoms of palpitations, chest pain, shortness of breath, orthopnea, PND, lower extremity edema, claudication, dizziness, presyncope, syncope, bleeding, or neurologic sequela. The patient is tolerating medications without difficulties.  She does have some fatigue and shortness of breath when she exerts herself but does feel well when she rests.   Past Medical History:  Diagnosis Date  . Alcohol abuse   . Gout   . History of cardiac cath 09/24/2017   09/24/17: normal coronary arteries, EF <25%  . Hypertension   . Non-ischemic cardiomyopathy (HCC) 09/18/2017   09/18/17- EF 20-25%  . Recurrent pneumonia    Past Surgical History:  Procedure Laterality Date  . KNEE ARTHROSCOPY    . RIGHT/LEFT HEART CATH AND CORONARY ANGIOGRAPHY N/A 09/20/2017   Procedure: RIGHT/LEFT HEART CATH AND CORONARY ANGIOGRAPHY;  Surgeon: Iran OuchArida, Muhammad A, MD;  Location: [facility];  Service: Cardiovascular;  Laterality: N/A;  . TONSILLECTOMY       Current Outpatient  Medications  Medication Sig Dispense Refill  . albuterol (PROVENTIL HFA;VENTOLIN HFA) 108 (90 Base) MCG/ACT inhaler Inhale 1-2 puffs into the lungs every 6 (six) hours as needed for wheezing or shortness of breath.    . Biotin 10 MG TABS Take 10 mg by mouth daily.     . carvedilol (COREG) 3.125 MG tablet Take 1 tablet (3.125 mg total) by mouth 2 (two) times daily with a meal. 180 tablet 1  . cholecalciferol (VITAMIN D) 1000 units tablet Take 1,000 Units by mouth daily.    . colchicine 0.6 MG tablet Take 0.6 mg by mouth daily as needed (gout flare up).    . Collagen 500 MG CAPS Take 500 mg by mouth daily.     . diphenhydrAMINE (BENADRYL) 25 MG tablet Take 25 mg by mouth every 6 (six) hours as needed for sleep.     . furosemide (LASIX) 20 MG tablet TAKE 2 TABLETS BY MOUTH ONCE DAILY THEN  TAKE  AN  ADDITIONAL  TABLET  IF  WEIGHT  INCREASES  BY  2  TO  3  POUNDS. 190 tablet 3  . Multiple Vitamin (MULTIVITAMIN WITH MINERALS) TABS tablet Take 1 tablet by mouth daily. 90 tablet 3  . potassium chloride SA (K-DUR,KLOR-CON) 20 MEQ tablet TAKE 1 TABLET BY MOUTH ONCE DAILY 90 tablet 3  . sacubitril-valsartan (ENTRESTO) 49-51 MG Take 1 tablet by mouth 2 (two) times daily. 60 tablet 2  . spironolactone (ALDACTONE) 25 MG tablet Take 1 tablet (25 mg total) by mouth daily. 90 tablet  3  . thiamine 100 MG tablet Take 1 tablet (100 mg total) by mouth daily. 30 tablet 5  . vitamin C (ASCORBIC ACID) 500 MG tablet Take 500 mg by mouth daily.     No current facility-administered medications for this visit.     Allergies:   Shellfish allergy, Amoxicillin, Other, and Penicillins   Social History:  The patient  reports that she has quit smoking. She has never used smokeless tobacco. She reports current alcohol use of about 21.0 standard drinks of alcohol per week. She reports that she does not use drugs.   Family History:  The patient's family history includes AAA (abdominal aortic aneurysm) in her father; Aortic  stenosis in her father; Prostate cancer in her father.    ROS:  Please see the history of present illness.   Otherwise, review of systems is positive for none.   All other systems are reviewed and negative.   PHYSICAL EXAM: VS:  BP 114/80   Pulse 73   Ht 5\' 7"  (1.702 m)   Wt 169 lb 6.4 oz (76.8 kg)   SpO2 98%   BMI 26.53 kg/m  , BMI Body mass index is 26.53 kg/m. GEN: Well nourished, well developed, in no acute distress  HEENT: normal  Neck: no JVD, carotid bruits, or masses Cardiac: RRR; no murmurs, rubs, or gallops,no edema  Respiratory:  clear to auscultation bilaterally, normal work of breathing GI: soft, nontender, nondistended, + BS MS: no deformity or atrophy  Skin: warm and dry Neuro:  Strength and sensation are intact Psych: euthymic mood, full affect  EKG:  EKG is ordered today. Personal review of the ekg ordered shows SR, IVCD, rate 73   Recent Labs: 04/06/2018: BUN 20; Creatinine, Ser 1.19; Potassium 4.3; Sodium 137    Lipid Panel     Component Value Date/Time   CHOL 186 09/20/2017 0413   TRIG 71 09/20/2017 0413   HDL 73 09/20/2017 0413   CHOLHDL 2.5 09/20/2017 0413   VLDL 14 09/20/2017 0413   LDLCALC 99 09/20/2017 0413     Wt Readings from Last 3 Encounters:  10/16/18 169 lb 6.4 oz (76.8 kg)  04/17/18 172 lb (78 kg)  03/26/18 168 lb 3.2 oz (76.3 kg)      Other studies Reviewed: Additional studies/ records that were reviewed today include: TTE 03/26/18  Review of the above records today demonstrates:   1. The left ventricle has a 3D calculated ejection fraction of 30%. The cavity size was normal. There is mild concentric left ventricular hypertrophy. Left ventricular diastolic Doppler parameters are indeterminate The E/e' is 15.3. There is abnormal  septal motion consistent with left bundle branch block. Left ventricular diffuse hypokinesis.  2. The right ventricle has moderately reduced systolic function. The cavity was normal. There is no increase  in right ventricular wall thickness. Right ventricular systolic pressure could not be assessed.  3. Trivial pericardial effusion is present.  4. The mitral valve is normal in structure. There is mild to moderate mitral annular calcification present.  5. The tricuspid valve is normal in structure.  6. The aortic valve is tricuspid.  7. Right atrial pressure is estimated at 3 mmHg.  CMRI 10/25/17 1. Mildly dilated left ventricle with mild concentric hypertrophy and severely decreased systolic function (LVEF =). There is diffuse hypokinesis and pronounced paradoxical septal motion. There is a ratio of non-compacted to compacted myocardium of > 2.5 in the apical segments. No late gadolinium enhancement is seen in the left ventricular  myocardium. 2. Normal right ventricular size, thickness and systolic function (LVEF = 47%). There are no regional wall motion abnormalities. 3.  Normal left and right atrial size. 4. Normal size of the aortic root, ascending aorta and pulmonary artery. 5. Mild mitral and trivial tricuspid regurgitation. 6.  Normal pericardium.  No pericardial effusion.  ASSESSMENT AND PLAN:  1.  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  She is currently on no medical therapy.  She would benefit from ICD implant.  She has a left bundle branch block and would thus benefit from CRT.  Risks and benefits were discussed which include bleeding, tamponade, infection, pneumothorax.  She understands these risks and is agreed to the procedure.   2.  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  Plan per heart failure cardiology.  Current medicines are reviewed at length with the patient today.   The patient does not have concerns regarding her medicines.  The following changes were made today:  none  Labs/ tests ordered today include:  Orders  Placed This Encounter  Procedures  . Basic metabolic panel  . CBC  . EKG 12-Lead    Disposition:   FU with Ami Thornsberry 3 months  Signed, Rakesha Dalporto Jorja Loa, MD  10/16/2018 2:43 PM     [facility] [address] [place] [address] [phone] (office) [phone] (fax)

## 2018-10-16 NOTE — Addendum Note (Signed)
Addended by: Stanton Kidney on: 10/16/2018 02:40 PM   Modules accepted: Orders

## 2018-10-17 LAB — CBC
Hematocrit: 40.9 % (ref 34.0–46.6)
Hemoglobin: 14.1 g/dL (ref 11.1–15.9)
MCH: 36.7 pg — ABNORMAL HIGH (ref 26.6–33.0)
MCHC: 34.5 g/dL (ref 31.5–35.7)
MCV: 107 fL — ABNORMAL HIGH (ref 79–97)
Platelets: 187 10*3/uL (ref 150–450)
RBC: 3.84 x10E6/uL (ref 3.77–5.28)
RDW: 12 % (ref 11.7–15.4)
WBC: 6.4 10*3/uL (ref 3.4–10.8)

## 2018-10-17 LAB — BASIC METABOLIC PANEL
BUN/Creatinine Ratio: 15 (ref 12–28)
BUN: 23 mg/dL (ref 8–27)
CO2: 21 mmol/L (ref 20–29)
Calcium: 10.1 mg/dL (ref 8.7–10.3)
Chloride: 99 mmol/L (ref 96–106)
Creatinine, Ser: 1.58 mg/dL — ABNORMAL HIGH (ref 0.57–1.00)
GFR calc Af Amer: 40 mL/min/{1.73_m2} — ABNORMAL LOW (ref >59)
GFR calc non Af Amer: 35 mL/min/{1.73_m2} — ABNORMAL LOW (ref >59)
Glucose: 92 mg/dL (ref 65–99)
Potassium: 5.5 mmol/L — ABNORMAL HIGH (ref 3.5–5.2)
Sodium: 138 mmol/L (ref 134–144)

## 2018-10-19 ENCOUNTER — Telehealth: Payer: Self-pay | Admitting: *Deleted

## 2018-10-19 DIAGNOSIS — Z01812 Encounter for preprocedural laboratory examination: Secondary | ICD-10-CM

## 2018-10-19 DIAGNOSIS — E875 Hyperkalemia: Secondary | ICD-10-CM

## 2018-10-19 DIAGNOSIS — R7989 Other specified abnormal findings of blood chemistry: Secondary | ICD-10-CM

## 2018-10-19 NOTE — Telephone Encounter (Signed)
Attempted to reach pt to discuss result.  Unable to leave a message, voicemail full.  Will attempt to reach pt next week.

## 2018-10-19 NOTE — Telephone Encounter (Signed)
-----   Message from Will Martin Camnitz, MD sent at 10/17/2018  9:50 AM EDT ----- K and creatinine elevated. Will need recheck in 1 week. Needs to avoid high K foods and follow up with PCP about creatinine.  

## 2018-10-19 NOTE — Telephone Encounter (Signed)
Notes recorded by Constance Haw, MD on 10/17/2018 at 9:50 AM EDT  K and creatinine elevated. Will need recheck in 1 week. Needs to avoid high K foods and follow up with PCP about creatinine

## 2018-10-24 NOTE — Telephone Encounter (Signed)
Informed patient of results and verbal understanding expressed.  Pt reports she currently takes K+ supplement. Pt aware I will discuss this with Dr. Curt Bears tomorrow and call her back with his advisement.  Pt will stop by the office on Monday, 9/21 for follow up blood work.

## 2018-10-24 NOTE — Telephone Encounter (Signed)
-----   Message from Will Meredith Leeds, MD sent at 10/17/2018  9:50 AM EDT ----- K and creatinine elevated. Will need recheck in 1 week. Needs to avoid high K foods and follow up with PCP about creatinine.

## 2018-10-29 ENCOUNTER — Other Ambulatory Visit: Payer: BC Managed Care – PPO | Admitting: *Deleted

## 2018-10-29 ENCOUNTER — Other Ambulatory Visit: Payer: Self-pay

## 2018-10-29 DIAGNOSIS — Z01812 Encounter for preprocedural laboratory examination: Secondary | ICD-10-CM

## 2018-10-29 DIAGNOSIS — E875 Hyperkalemia: Secondary | ICD-10-CM

## 2018-10-29 DIAGNOSIS — R7989 Other specified abnormal findings of blood chemistry: Secondary | ICD-10-CM

## 2018-10-29 NOTE — Telephone Encounter (Signed)
Pt coming into today for blood work. Advised to continue her K+ supplementation. Patient verbalized understanding and agreeable to plan.

## 2018-10-30 ENCOUNTER — Telehealth: Payer: Self-pay | Admitting: *Deleted

## 2018-10-30 ENCOUNTER — Other Ambulatory Visit (HOSPITAL_COMMUNITY)
Admission: RE | Admit: 2018-10-30 | Discharge: 2018-10-30 | Disposition: A | Discharge status: Home / Self Care | Payer: BC Managed Care – PPO | Source: Ambulatory Visit | Attending: Cardiology | Admitting: Cardiology

## 2018-10-30 DIAGNOSIS — I110 Hypertensive heart disease with heart failure: Secondary | ICD-10-CM | POA: Diagnosis not present

## 2018-10-30 DIAGNOSIS — Z20828 Contact with and (suspected) exposure to other viral communicable diseases: Secondary | ICD-10-CM | POA: Diagnosis not present

## 2018-10-30 DIAGNOSIS — Z79899 Other long term (current) drug therapy: Secondary | ICD-10-CM | POA: Diagnosis not present

## 2018-10-30 DIAGNOSIS — I447 Left bundle-branch block, unspecified: Secondary | ICD-10-CM | POA: Diagnosis not present

## 2018-10-30 DIAGNOSIS — I5022 Chronic systolic (congestive) heart failure: Secondary | ICD-10-CM | POA: Diagnosis not present

## 2018-10-30 DIAGNOSIS — M109 Gout, unspecified: Secondary | ICD-10-CM | POA: Diagnosis not present

## 2018-10-30 DIAGNOSIS — Z006 Encounter for examination for normal comparison and control in clinical research program: Secondary | ICD-10-CM | POA: Diagnosis not present

## 2018-10-30 DIAGNOSIS — Z880 Allergy status to penicillin: Secondary | ICD-10-CM | POA: Diagnosis not present

## 2018-10-30 DIAGNOSIS — I428 Other cardiomyopathies: Secondary | ICD-10-CM | POA: Diagnosis not present

## 2018-10-30 LAB — BASIC METABOLIC PANEL
BUN/Creatinine Ratio: 12 (ref 12–28)
BUN: 13 mg/dL (ref 8–27)
CO2: 23 mmol/L (ref 20–29)
Calcium: 10.3 mg/dL (ref 8.7–10.3)
Chloride: 102 mmol/L (ref 96–106)
Creatinine, Ser: 1.07 mg/dL — ABNORMAL HIGH (ref 0.57–1.00)
GFR calc Af Amer: 64 mL/min/{1.73_m2} (ref >59)
GFR calc non Af Amer: 56 mL/min/{1.73_m2} — ABNORMAL LOW (ref >59)
Glucose: 105 mg/dL — ABNORMAL HIGH (ref 65–99)
Potassium: 4.6 mmol/L (ref 3.5–5.2)
Sodium: 141 mmol/L (ref 134–144)

## 2018-10-30 LAB — CBC
Hematocrit: 39.6 % (ref 34.0–46.6)
Hemoglobin: 14.1 g/dL (ref 11.1–15.9)
MCH: 36.7 pg — ABNORMAL HIGH (ref 26.6–33.0)
MCHC: 35.6 g/dL (ref 31.5–35.7)
MCV: 103 fL — ABNORMAL HIGH (ref 79–97)
Platelets: 174 10*3/uL (ref 150–450)
RBC: 3.84 x10E6/uL (ref 3.77–5.28)
RDW: 12.4 % (ref 11.7–15.4)
WBC: 5.2 10*3/uL (ref 3.4–10.8)

## 2018-10-30 NOTE — Telephone Encounter (Signed)
-----   Message from Will Meredith Leeds, MD sent at 10/30/2018  9:04 AM EDT ----- Stable preop labs

## 2018-10-30 NOTE — Telephone Encounter (Signed)
Pt advised to take her K+ supplement as she usually does. Patient verbalized understanding and agreeable to plan.

## 2018-10-30 NOTE — Telephone Encounter (Signed)
Pt has been notified of lab results by phone with verbal understanding. Pt asked should she restart her K+? I advised pt since Dr. Curt Bears has made recommendations to restarting the K+ she should continue on her current plan. I assured her that I will send a message to Sherri P. RN to d/w Dr. Curt Bears in regards to K+ and Sherri will touch base with her.  Pt thanked me for the call and my help. The patient has been notified of the result and verbalized understanding.  All questions (if any) were answered. Julaine Hua, [facility] 10/30/2018 10:43 AM

## 2018-10-31 LAB — NOVEL CORONAVIRUS, NAA (HOSP ORDER, SEND-OUT TO REF LAB; TAT 18-24 HRS): SARS-CoV-2, NAA: NOT DETECTED

## 2018-11-01 ENCOUNTER — Telehealth: Payer: Self-pay | Admitting: *Deleted

## 2018-11-01 NOTE — Telephone Encounter (Signed)
Informed pt of a schedule change in the hospital tomorrow. Aware procedure has been moved to 2pm and to arrive to [facility] at noon. Patient verbalized understanding and agreeable to plan.

## 2018-11-02 ENCOUNTER — Encounter (HOSPITAL_COMMUNITY)
Admission: RE | Disposition: A | Discharge status: Home / Self Care | Payer: BC Managed Care – PPO | Source: Home / Self Care | Attending: Cardiology

## 2018-11-02 ENCOUNTER — Other Ambulatory Visit: Payer: Self-pay

## 2018-11-02 ENCOUNTER — Encounter (HOSPITAL_COMMUNITY): Payer: Self-pay | Admitting: Cardiology

## 2018-11-02 ENCOUNTER — Ambulatory Visit (HOSPITAL_COMMUNITY)
Admission: RE | Admit: 2018-11-02 | Discharge: 2018-11-03 | Disposition: A | Discharge status: Home / Self Care | Payer: BC Managed Care – PPO | Attending: Cardiology | Admitting: Cardiology

## 2018-11-02 DIAGNOSIS — I5022 Chronic systolic (congestive) heart failure: Secondary | ICD-10-CM | POA: Diagnosis not present

## 2018-11-02 DIAGNOSIS — I110 Hypertensive heart disease with heart failure: Secondary | ICD-10-CM | POA: Insufficient documentation

## 2018-11-02 DIAGNOSIS — I428 Other cardiomyopathies: Secondary | ICD-10-CM | POA: Insufficient documentation

## 2018-11-02 DIAGNOSIS — M109 Gout, unspecified: Secondary | ICD-10-CM | POA: Insufficient documentation

## 2018-11-02 DIAGNOSIS — I447 Left bundle-branch block, unspecified: Secondary | ICD-10-CM | POA: Insufficient documentation

## 2018-11-02 DIAGNOSIS — IMO0001 Reserved for inherently not codable concepts without codable children: Secondary | ICD-10-CM

## 2018-11-02 DIAGNOSIS — Z79899 Other long term (current) drug therapy: Secondary | ICD-10-CM | POA: Insufficient documentation

## 2018-11-02 DIAGNOSIS — Z006 Encounter for examination for normal comparison and control in clinical research program: Secondary | ICD-10-CM | POA: Diagnosis not present

## 2018-11-02 DIAGNOSIS — Z95818 Presence of other cardiac implants and grafts: Secondary | ICD-10-CM

## 2018-11-02 DIAGNOSIS — Z880 Allergy status to penicillin: Secondary | ICD-10-CM | POA: Insufficient documentation

## 2018-11-02 DIAGNOSIS — Z95810 Presence of automatic (implantable) cardiac defibrillator: Secondary | ICD-10-CM | POA: Diagnosis not present

## 2018-11-02 DIAGNOSIS — Z20828 Contact with and (suspected) exposure to other viral communicable diseases: Secondary | ICD-10-CM | POA: Insufficient documentation

## 2018-11-02 HISTORY — PX: BIV ICD INSERTION CRT-D: EP1195

## 2018-11-02 LAB — SURGICAL PCR SCREEN
MRSA, PCR: NEGATIVE
Staphylococcus aureus: NEGATIVE

## 2018-11-02 SURGERY — BIV ICD INSERTION CRT-D

## 2018-11-02 MED ORDER — MIDAZOLAM HCL 5 MG/5ML IJ SOLN
INTRAMUSCULAR | Status: AC
  Filled 2018-11-02: qty 5

## 2018-11-02 MED ORDER — DIPHENHYDRAMINE HCL 25 MG PO CAPS
75.0000 mg | ORAL_CAPSULE | Freq: Every evening | ORAL | Status: DC | PRN
  Administered 2018-11-02: 75 mg via ORAL
  Filled 2018-11-02: qty 3

## 2018-11-02 MED ORDER — ACETAMINOPHEN 325 MG PO TABS
325.0000 mg | ORAL_TABLET | ORAL | Status: DC | PRN
  Administered 2018-11-03: 650 mg via ORAL
  Filled 2018-11-02: qty 2

## 2018-11-02 MED ORDER — LORAZEPAM 1 MG PO TABS: 1.0000 mg | ORAL_TABLET | Freq: Once | ORAL | Status: DC

## 2018-11-02 MED ORDER — FUROSEMIDE 40 MG PO TABS
40.0000 mg | ORAL_TABLET | Freq: Every day | ORAL | Status: DC
  Administered 2018-11-03: 40 mg via ORAL
  Filled 2018-11-02 (×2): qty 1

## 2018-11-02 MED ORDER — FENTANYL CITRATE (PF) 100 MCG/2ML IJ SOLN
INTRAMUSCULAR | Status: DC | PRN
  Administered 2018-11-02: 50 ug via INTRAVENOUS
  Administered 2018-11-02 (×3): 12.5 ug via INTRAVENOUS
  Administered 2018-11-02 (×2): 25 ug via INTRAVENOUS
  Administered 2018-11-02 (×2): 12.5 ug via INTRAVENOUS
  Administered 2018-11-02: 25 ug via INTRAVENOUS
  Administered 2018-11-02 (×4): 12.5 ug via INTRAVENOUS
  Administered 2018-11-02: 25 ug via INTRAVENOUS

## 2018-11-02 MED ORDER — SODIUM CHLORIDE 0.9 % IV SOLN
80.0000 mg | INTRAVENOUS | Status: AC
  Administered 2018-11-02: 80 mg
  Filled 2018-11-02: qty 2

## 2018-11-02 MED ORDER — VANCOMYCIN HCL IN DEXTROSE 1-5 GM/200ML-% IV SOLN
1000.0000 mg | Freq: Two times a day (BID) | INTRAVENOUS | Status: AC
  Administered 2018-11-03: 1000 mg via INTRAVENOUS
  Filled 2018-11-02: qty 200

## 2018-11-02 MED ORDER — SODIUM CHLORIDE 0.9 % IV SOLN
INTRAVENOUS | Status: DC
  Administered 2018-11-02: 13:00:00 via INTRAVENOUS

## 2018-11-02 MED ORDER — COLCHICINE 0.6 MG PO TABS: 0.6000 mg | ORAL_TABLET | Freq: Every day | ORAL | Status: DC | PRN

## 2018-11-02 MED ORDER — LORAZEPAM 2 MG/ML IJ SOLN
INTRAMUSCULAR | Status: AC
  Filled 2018-11-02: qty 1

## 2018-11-02 MED ORDER — FENTANYL CITRATE (PF) 100 MCG/2ML IJ SOLN
INTRAMUSCULAR | Status: AC
  Filled 2018-11-02: qty 2

## 2018-11-02 MED ORDER — ADULT MULTIVITAMIN W/MINERALS CH
1.0000 | ORAL_TABLET | Freq: Every day | ORAL | Status: DC
  Administered 2018-11-03: 1 via ORAL
  Filled 2018-11-02: qty 1

## 2018-11-02 MED ORDER — CHLOROPROCAINE HCL 2 % IJ SOLN
INTRAMUSCULAR | Status: DC | PRN
  Administered 2018-11-02: 45 mL

## 2018-11-02 MED ORDER — VANCOMYCIN HCL IN DEXTROSE 1-5 GM/200ML-% IV SOLN
1000.0000 mg | INTRAVENOUS | Status: AC
  Administered 2018-11-02: 1000 mg via INTRAVENOUS

## 2018-11-02 MED ORDER — VANCOMYCIN HCL IN DEXTROSE 1-5 GM/200ML-% IV SOLN
INTRAVENOUS | Status: AC
  Filled 2018-11-02: qty 200

## 2018-11-02 MED ORDER — POTASSIUM CHLORIDE CRYS ER 20 MEQ PO TBCR
20.0000 meq | EXTENDED_RELEASE_TABLET | Freq: Two times a day (BID) | ORAL | Status: DC
  Administered 2018-11-02 – 2018-11-03 (×2): 20 meq via ORAL
  Filled 2018-11-02: qty 1
  Filled 2018-11-02: qty 2

## 2018-11-02 MED ORDER — HEPARIN (PORCINE) IN NACL 1000-0.9 UT/500ML-% IV SOLN
INTRAVENOUS | Status: DC | PRN
  Administered 2018-11-02: 500 mL

## 2018-11-02 MED ORDER — SODIUM CHLORIDE 0.9 % IV SOLN
INTRAVENOUS | Status: AC
  Filled 2018-11-02: qty 2

## 2018-11-02 MED ORDER — CHLORHEXIDINE GLUCONATE 4 % EX LIQD: 60.0000 mL | Freq: Once | CUTANEOUS | Status: DC

## 2018-11-02 MED ORDER — VITAMIN D 25 MCG (1000 UNIT) PO TABS
2000.0000 [IU] | ORAL_TABLET | Freq: Every day | ORAL | Status: DC
  Administered 2018-11-03: 2000 [IU] via ORAL
  Filled 2018-11-02: qty 2

## 2018-11-02 MED ORDER — SPIRONOLACTONE 25 MG PO TABS
25.0000 mg | ORAL_TABLET | Freq: Every day | ORAL | Status: DC
  Administered 2018-11-02: 25 mg via ORAL
  Filled 2018-11-02: qty 1

## 2018-11-02 MED ORDER — ACETAMINOPHEN 500 MG PO TABS
1000.0000 mg | ORAL_TABLET | Freq: Four times a day (QID) | ORAL | Status: DC | PRN
  Administered 2018-11-02 – 2018-11-03 (×2): 1000 mg via ORAL
  Filled 2018-11-02 (×2): qty 2

## 2018-11-02 MED ORDER — ONDANSETRON HCL 4 MG/2ML IJ SOLN: 4.0000 mg | Freq: Four times a day (QID) | INTRAMUSCULAR | Status: DC | PRN

## 2018-11-02 MED ORDER — MUPIROCIN 2 % EX OINT
TOPICAL_OINTMENT | CUTANEOUS | Status: AC
  Administered 2018-11-02: 1 via NASAL
  Filled 2018-11-02: qty 22

## 2018-11-02 MED ORDER — LORAZEPAM 2 MG/ML IJ SOLN
1.0000 mg | Freq: Once | INTRAMUSCULAR | Status: AC
  Administered 2018-11-02: 1 mg via INTRAVENOUS

## 2018-11-02 MED ORDER — ALBUTEROL SULFATE (2.5 MG/3ML) 0.083% IN NEBU: 2.5000 mg | INHALATION_SOLUTION | Freq: Four times a day (QID) | RESPIRATORY_TRACT | Status: DC | PRN

## 2018-11-02 MED ORDER — VITAMIN B-12 1000 MCG PO TABS
1000.0000 ug | ORAL_TABLET | Freq: Every day | ORAL | Status: DC
  Administered 2018-11-03: 1000 ug via ORAL
  Filled 2018-11-02: qty 1

## 2018-11-02 MED ORDER — SACUBITRIL-VALSARTAN 49-51 MG PO TABS
1.0000 | ORAL_TABLET | Freq: Two times a day (BID) | ORAL | Status: DC
  Administered 2018-11-02 – 2018-11-03 (×2): 1 via ORAL
  Filled 2018-11-02 (×2): qty 1

## 2018-11-02 MED ORDER — MIDAZOLAM HCL 5 MG/5ML IJ SOLN
INTRAMUSCULAR | Status: DC | PRN
  Administered 2018-11-02: 1 mg via INTRAVENOUS
  Administered 2018-11-02: 3 mg via INTRAVENOUS
  Administered 2018-11-02 (×13): 1 mg via INTRAVENOUS
  Administered 2018-11-02: 2 mg via INTRAVENOUS
  Administered 2018-11-02: 1 mg via INTRAVENOUS

## 2018-11-02 MED ORDER — IOHEXOL 350 MG/ML SOLN
INTRAVENOUS | Status: DC | PRN
  Administered 2018-11-02: 10 mL

## 2018-11-02 MED ORDER — CARVEDILOL 3.125 MG PO TABS
3.1250 mg | ORAL_TABLET | Freq: Two times a day (BID) | ORAL | Status: DC
  Administered 2018-11-02 – 2018-11-03 (×2): 3.125 mg via ORAL
  Filled 2018-11-02 (×2): qty 1

## 2018-11-02 SURGICAL SUPPLY — 19 items
BALLN ATTAIN 80 (BALLOONS) ×2
BALLOON ATTAIN 80 (BALLOONS) IMPLANT
CABLE SURGICAL S-101-97-12 (CABLE) ×2 IMPLANT
CATH ATTAIN COM SURV 6250V-MB2 (CATHETERS) ×1 IMPLANT
CATH ATTAIN COM SURV 6250V-MPR (CATHETERS) ×1 IMPLANT
CATH HEX JOSEPH 2-5-2 65CM 6F (CATHETERS) ×1 IMPLANT
ICD COBALT CRTD DTPB2QQ (ICD Generator) ×1 IMPLANT
KIT ESSENTIALS PG (KITS) ×1 IMPLANT
LEAD ATTAIN PERFORM ST 4398-88 (Lead) ×1 IMPLANT
LEAD CAPSURE NOVUS 5076-52CM (Lead) ×1 IMPLANT
LEAD SPRINT QUAT SEC 6935M-62 (Lead) ×1 IMPLANT
PAD PRO RADIOLUCENT 2001M-C (PAD) ×2 IMPLANT
SHEATH 7FR PRELUDE SNAP 13 (SHEATH) ×1 IMPLANT
SHEATH 9.5FR PRELUDE SNAP 13 (SHEATH) ×1 IMPLANT
SHEATH 9FR PRELUDE SNAP 13 (SHEATH) ×1 IMPLANT
SLITTER 6232ADJ (MISCELLANEOUS) ×1 IMPLANT
TRAY PACEMAKER INSERTION (PACKS) ×2 IMPLANT
WIRE ACUITY WHISPER EDS 4648 (WIRE) ×1 IMPLANT
WIRE HI TORQ VERSACORE-J 145CM (WIRE) ×1 IMPLANT

## 2018-11-02 NOTE — Discharge Instructions (Signed)
° ° °  Supplemental Discharge Instructions for  Pacemaker/Defibrillator Patients  Activity No heavy lifting or vigorous activity with your left/right arm for 6 to 8 weeks.  Do not raise your left/right arm above your head for one week.  Gradually raise your affected arm as drawn below.             11/06/2018                 11/07/2018                11/08/2018               11/09/2018 __  NO DRIVING until cleared to at your wound check visit  WOUND CARE - Keep the wound area clean and dry.  Do not get this area wet, no showersuntil cleared to at your wound check visit  . - The tape/steri-strips on your wound will fall off; do not pull them off.  No bandage is needed on the site.  DO  NOT apply any creams, oils, or ointments to the wound area. - If you notice any drainage or discharge from the wound, any swelling or bruising at the site, or you develop a fever > 101? F after you are discharged home, call the office at once.  Special Instructions - You are still able to use cellular telephones; use the ear opposite the side where you have your pacemaker/defibrillator.  Avoid carrying your cellular phone near your device. - When traveling through airports, show security personnel your identification card to avoid being screened in the metal detectors.  Ask the security personnel to use the hand wand. - Avoid arc welding equipment, MRI testing (magnetic resonance imaging), TENS units (transcutaneous nerve stimulators).  Call the office for questions about other devices. - Avoid electrical appliances that are in poor condition or are not properly grounded. - Microwave ovens are safe to be near or to operate.  Additional information for defibrillator patients should your device go off: - If your device goes off ONCE and you feel fine afterward, notify the device clinic nurses. - If your device goes off ONCE and you do not feel well afterward, call 911. - If your device goes off TWICE, call 911. - If  your device goes off THREE times in one day, call 911.  DO NOT DRIVE YOURSELF OR A FAMILY MEMBER WITH A DEFIBRILLATOR TO THE HOSPITAL--CALL 911.

## 2018-11-02 NOTE — H&P (Signed)
ICD Criteria  Current LVEF:30%. Within 12 months prior to implant: Yes   Heart failure history: Yes, Class II  Cardiomyopathy history: Yes, Non-Ischemic Cardiomyopathy.  Atrial Fibrillation/Atrial Flutter: No.  Ventricular tachycardia history: No.  Cardiac arrest history: No.  History of syndromes with risk of sudden death: No.  Previous ICD: No.  Current ICD indication: Primary  PPM indication: No.  Class I or II Bradycardia indication present: No  Beta Blocker therapy for 3 or more months: Yes, prescribed.   Ace Inhibitor/ARB therapy for 3 or more months: Yes, prescribed.    I have seen Beverly Andrews is a 63 y.o. femalepre-procedural and has been referred by Bensimhon for consideration of ICD implant for primary prevention of sudden death.  The patient's chart has been reviewed and they meet criteria for ICD implant.  I have had a thorough discussion with the patient reviewing options.  The patient and their family (if available) have had opportunities to ask questions and have them answered. The patient and I have decided together through the [place] Support [employer] to implant ICD at this time.  Risks, benefits, alternatives to ICD implantation were discussed in detail with the patient today. The patient  understands that the risks include but are not limited to bleeding, infection, pneumothorax, perforation, tamponade, vascular damage, renal failure, MI, stroke, death, inappropriate shocks, and lead dislodgement and  wishes to proceed.

## 2018-11-02 NOTE — Progress Notes (Signed)
Called pharmacy concerning patient's carbocaine allergy.  Bupivicaine and lidocaine can not be utilized.  Trinidad Curet, the pharmacist, will advise of what substitution can be utilized.  Advised that we need 29ml.  Santiago Glad will call back to the front desk.

## 2018-11-03 ENCOUNTER — Encounter (HOSPITAL_COMMUNITY): Payer: Self-pay | Admitting: General Practice

## 2018-11-03 ENCOUNTER — Ambulatory Visit (HOSPITAL_COMMUNITY): Payer: BC Managed Care – PPO

## 2018-11-03 DIAGNOSIS — I5022 Chronic systolic (congestive) heart failure: Secondary | ICD-10-CM

## 2018-11-03 DIAGNOSIS — I428 Other cardiomyopathies: Secondary | ICD-10-CM

## 2018-11-03 DIAGNOSIS — Z20828 Contact with and (suspected) exposure to other viral communicable diseases: Secondary | ICD-10-CM | POA: Diagnosis not present

## 2018-11-03 DIAGNOSIS — Z006 Encounter for examination for normal comparison and control in clinical research program: Secondary | ICD-10-CM | POA: Diagnosis not present

## 2018-11-03 DIAGNOSIS — I447 Left bundle-branch block, unspecified: Secondary | ICD-10-CM | POA: Diagnosis not present

## 2018-11-03 MED ORDER — ENTRESTO 49-51 MG PO TABS: 1.0000 | ORAL_TABLET | Freq: Two times a day (BID) | ORAL | 3 refills | Status: DC

## 2018-11-03 NOTE — Progress Notes (Signed)
Patient Name: Beverly Andrews      SUBJECTIVE: some shoulder discomfort but without SOB or pleuritic pain  Past Medical History:  Diagnosis Date  . Alcohol abuse   . Gout   . History of cardiac cath 09/24/2017   09/24/17: normal coronary arteries, EF <25%  . Hypertension   . Non-ischemic cardiomyopathy (HCC) 09/18/2017   09/18/17- EF 20-25%  . Recurrent pneumonia     Scheduled Meds:  Scheduled Meds: . carvedilol  3.125 mg Oral BID WC  . cholecalciferol  2,000 Units Oral Daily  . furosemide  40 mg Oral Daily  . multivitamin with minerals  1 tablet Oral Daily  . potassium chloride SA  20 mEq Oral BID  . sacubitril-valsartan  1 tablet Oral BID  . spironolactone  25 mg Oral QHS  . vitamin B-12  1,000 mcg Oral Daily   Continuous Infusions: acetaminophen, acetaminophen, albuterol, colchicine, diphenhydrAMINE, ondansetron (ZOFRAN) IV    PHYSICAL EXAM Vitals:   11/02/18 1753 11/02/18 2109 11/03/18 0040 11/03/18 0441  BP:  (!) 122/59 123/77 134/73  Pulse:  89 71 70  Resp:  15 15 16   Temp:  98.3 F (36.8 C) 97.6 F (36.4 C) 98.1 F (36.7 C)  TempSrc:  Oral Oral Oral  SpO2:  99% 98% 98%  Weight: 71.7 kg   77.6 kg  Height:        Well developed and nourished in no acute distress HENT normal Neck supple  Pocket without  hematoma, swelling or tenderness  Clear Regular rate and rhythm, no murmurs or gallops Abd-soft with active BS No Clubbing cyanosis edema Skin-warm and dry A & Oriented  Grossly normal sensory and motor function    TELEMETRY: Reviewed personally pt in P-synchronous/ AV  pacing :  ECG personally reviewed P-synchronous/ AV  pacing with QRS 138 rS V1 and rS lead 1  Chest Xray personally reviewed distal poles in the vein, prox #4 in the body of CS    Intake/Output Summary (Last 24 hours) at 11/03/2018 0754 Last data filed at 11/03/2018 0456 Gross per 24 hour  Intake 680 ml  Output 500 ml  Net 180 ml    LABS: Basic Metabolic Panel:  Recent Labs  Lab 10/29/18 1350  NA 141  K 4.6  CL 102  CO2 23  GLUCOSE 105*  BUN 13  CREATININE 1.07*  CALCIUM 10.3   Cardiac Enzymes: No results for input(s): CKTOTAL, CKMB, CKMBINDEX, TROPONINI in the last 72 hours. CBC: Recent Labs  Lab 10/29/18 1350  WBC 5.2  HGB 14.1  HCT 39.6  MCV 103*  PLT 174   PROTIME: No results for input(s): LABPROT, INR in the last 72 hours. Liver Function Tests: No results for input(s): AST, ALT, ALKPHOS, BILITOT, PROT, ALBUMIN in the last 72 hours. No results for input(s): LIPASE, AMYLASE in the last 72 hours. BNP: BNP (last 3 results) No results for input(s): BNP in the last 8760 hours.  ProBNP (last 3 results) No results for input(s): PROBNP in the last 8760 hours.  D-Dimer: No results for input(s): DDIMER in the last 72 hours. Hemoglobin A1C: No results for input(s): HGBA1C in the last 72 hours. Fasting Lipid Panel: No results for input(s): CHOL, HDL, LDLCALC, TRIG, CHOLHDL, LDLDIRECT in the last 72 hours. Thyroid Function Tests: No results for input(s): TSH, T4TOTAL, T3FREE, THYROIDAB in the last 72 hours.  Invalid input(s): FREET3 Anemia Panel: No results for input(s): VITAMINB12, FOLATE, FERRITIN, TIBC, IRON, RETICCTPCT in the  last 72 hours.   Device Interrogation: normal device function utilizing poles 1-2    ASSESSMENT AND PLAN:  Active Problems:   Chronic systolic (congestive) heart failure (HCC) NICM  ? Non compacton  LBBB  Instructions given  Will need work for liimited duty for 4 weeks ( lifting< 40 lbs )    Signed, Virl Axe MD  11/03/2018

## 2018-11-03 NOTE — Progress Notes (Signed)
Discharge instructions given to patient, all questions answered and concerns addressed. Patient said she wanted to talk to case manager about paying for her entresto. Case manager Roque Cash aware.

## 2018-11-04 DIAGNOSIS — Z95810 Presence of automatic (implantable) cardiac defibrillator: Secondary | ICD-10-CM | POA: Diagnosis not present

## 2018-11-04 NOTE — Discharge Summary (Signed)
Discharge Summary    Patient ID: Beverly Andrews MRN: [id number]; DOB: 27-Mar-1955  Admit date: 11/02/2018 Discharge date: 11/04/2018  Primary Care Provider: Deatra James, MD  Primary Cardiologist: Thurmon Fair, MD  Primary Electrophysiologist:  Regan Lemming, MD   Discharge Diagnoses    Principal Problem:   Non-ischemic cardiomyopathy [facility]) Active Problems   MDT BiV CRTd implanted 11/02/2018   Left bundle branch block   Normal coronary arteries   Chronic systolic (congestive) heart failure (HCC)   Allergies Allergies  Allergen Reactions   Amoxicillin Itching and Swelling   Carbocaine [Mepivacaine] Hives and Swelling   Penicillins Itching and Swelling    Did it involve swelling of the face/tongue/throat, SOB, or low BP? Yes Did it involve sudden or severe rash/hives, skin peeling, or any reaction on the inside of your mouth or nose? Yes Did you need to seek medical attention at a hospital or doctor's office? No When did it last happen?10 + years If all above answers are "NO", may proceed with cephalosporin use.     Shellfish Allergy Swelling    Diagnostic Studies/Procedures    MDT BiV CRTd im-planted 11/02/2018 _____________   History of Present Illness     63 y/o female admitted for elective Biv CRTD implant  Hospital Course     63 year old female diagnosed with congestive heart failure in the summer 2019.  Ejection fraction by cardiac MRI in July 2019 was 20 to 25%.  Catheterization in August 2019 showed normal coronaries.  She has a left bundle branch block.  She was put on medications and followed.  Echocardiogram in February 2020 showed an ejection fraction of 30%.  She was referred to Dr. Elberta Fortis for consideration of a CRT defibrillator.  She was admitted 11/02/2018 for elective device implant.  She tolerated this well.  She was seen by Dr. Graciela Husbands on rounds 11/03/2018 and felt to be stable for discharge.  Follow-up appointments have been arranged.  He  is discharged in stable condition. _____________  Discharge Vitals Blood pressure 122/63, pulse 77, temperature 98.1 F (36.7 C), temperature source Oral, resp. rate 16, height 5\' 7"  (1.702 m), weight 77.6 kg, SpO2 98 %.  Filed Weights   11/02/18 1157 11/02/18 1753 11/03/18 0441  Weight: 74.8 kg 71.7 kg 77.6 kg    Labs & Radiologic Studies    CBC No results for input(s): WBC, NEUTROABS, HGB, HCT, MCV, PLT in the last 72 hours. Basic Metabolic Panel No results for input(s): NA, K, CL, CO2, GLUCOSE, BUN, CREATININE, CALCIUM, MG, PHOS in the last 72 hours. Liver Function Tests No results for input(s): AST, ALT, ALKPHOS, BILITOT, PROT, ALBUMIN in the last 72 hours. No results for input(s): LIPASE, AMYLASE in the last 72 hours. High Sensitivity Troponin:   No results for input(s): TROPONINIHS in the last 720 hours.  BNP Invalid input(s): POCBNP D-Dimer No results for input(s): DDIMER in the last 72 hours. Hemoglobin A1C No results for input(s): HGBA1C in the last 72 hours. Fasting Lipid Panel No results for input(s): CHOL, HDL, LDLCALC, TRIG, CHOLHDL, LDLDIRECT in the last 72 hours. Thyroid Function Tests No results for input(s): TSH, T4TOTAL, T3FREE, THYROIDAB in the last 72 hours.  Invalid input(s): FREET3 _____________  Dg Chest 2 View  Result Date: 11/03/2018 CLINICAL DATA:  Cardiac device in situ. EXAM: CHEST - 2 VIEW COMPARISON:  Radiograph of September 19, 2017. FINDINGS: The heart size and mediastinal contours are within normal limits. Both lungs are clear. No pneumothorax or pleural effusion is noted.  Interval placement of left-sided pacemaker with leads in grossly good position. The visualized skeletal structures are unremarkable. IMPRESSION: Interval placement of left-sided pacemaker with leads in grossly good position. No pneumothorax is noted. Electronically Signed   By: Marijo Conception M.D.   On: 11/03/2018 09:20   Disposition   Pt is being discharged home today in good  condition.  Follow-up Plans & Appointments    Follow-up Information    [facility] Follow up.   Specialty: Cardiology Why: 11/15/2018 @ 2;00PM, wound check visit Contact information: [address] [place]       Constance Haw, MD Follow up.   Specialty: Cardiology Why: 02/12/2019 @ 2:00PM Contact information: [place] [address] [phone]            Discharge Medications   Allergies as of 11/03/2018      Reactions   Amoxicillin Itching, Swelling   Carbocaine [mepivacaine] Hives, Swelling   Penicillins Itching, Swelling   Did it involve swelling of the face/tongue/throat, SOB, or low BP? Yes Did it involve sudden or severe rash/hives, skin peeling, or any reaction on the inside of your mouth or nose? Yes Did you need to seek medical attention at a hospital or doctor's office? No When did it last happen?10 + years If all above answers are "NO", may proceed with cephalosporin use.   Shellfish Allergy Swelling      Medication List    TAKE these medications   acetaminophen 500 MG tablet Commonly known as: TYLENOL Take 1,000 mg by mouth every 6 (six) hours as needed for moderate pain or headache.   albuterol 108 (90 Base) MCG/ACT inhaler Commonly known as: VENTOLIN HFA Inhale 1-2 puffs into the lungs every 6 (six) hours as needed for wheezing or shortness of breath.   BIOTIN PO Take 500 mcg by mouth daily.   carvedilol 3.125 MG tablet Commonly known as: COREG Take 1 tablet (3.125 mg total) by mouth 2 (two) times daily with a meal.   colchicine 0.6 MG tablet Take 0.6 mg by mouth daily as needed (gout flare up).   COLLAGEN PO Take 1 tablet by mouth daily.   diphenhydrAMINE 25 MG tablet Commonly known as: BENADRYL Take 75 mg by mouth at bedtime as needed for sleep.   Entresto 49-51 MG Generic drug: sacubitril-valsartan Take 1 tablet by  mouth 2 (two) times daily. What changed: Another medication with the same name was added. Make sure you understand how and when to take each.   Entresto 49-51 MG Generic drug: sacubitril-valsartan Take 1 tablet by mouth 2 (two) times daily. What changed: You were already taking a medication with the same name, and this prescription was added. Make sure you understand how and when to take each.   furosemide 20 MG tablet Commonly known as: LASIX TAKE 2 TABLETS BY MOUTH ONCE DAILY THEN  TAKE  AN  ADDITIONAL  TABLET  IF  WEIGHT  INCREASES  BY  2  TO  3  POUNDS. What changed: See the new instructions.   multivitamin with minerals Tabs tablet Take 1 tablet by mouth daily.   potassium chloride SA 20 MEQ tablet Commonly known as: K-DUR TAKE 1 TABLET BY MOUTH ONCE DAILY What changed: when to take this   spironolactone 25 MG tablet Commonly known as: ALDACTONE Take 1 tablet (25 mg total) by mouth daily. What changed: when to take this  vitamin B-12 1000 MCG tablet Commonly known as: CYANOCOBALAMIN Take 1,000 mcg by mouth daily.   VITAMIN C PO Take 1 tablet by mouth daily.   Vitamin D 50 MCG (2000 UT) tablet Take 2,000 Units by mouth daily.        Acute coronary syndrome (MI, NSTEMI, STEMI, etc) this admission?: No.    Outstanding Labs/Studies    Duration of Discharge Encounter   Greater than 30 minutes including physician time.  Jolene Provost, PA-C 11/04/2018, 8:06 AM

## 2018-11-15 ENCOUNTER — Ambulatory Visit (INDEPENDENT_AMBULATORY_CARE_PROVIDER_SITE_OTHER): Payer: BC Managed Care – PPO | Admitting: *Deleted

## 2018-11-15 ENCOUNTER — Encounter: Payer: Self-pay | Admitting: *Deleted

## 2018-11-15 ENCOUNTER — Other Ambulatory Visit: Payer: Self-pay

## 2018-11-15 DIAGNOSIS — I428 Other cardiomyopathies: Secondary | ICD-10-CM

## 2018-11-15 NOTE — Patient Instructions (Signed)
Call office if you have increased swelling, redness, increased pain or drainage from incision site. Call office to set up remote monitor.Work note provided by [employer].

## 2018-11-29 ENCOUNTER — Telehealth (HOSPITAL_COMMUNITY): Payer: Self-pay | Admitting: Pharmacy Technician

## 2018-11-29 LAB — CUP PACEART INCLINIC DEVICE CHECK
Brady Statistic RA Percent Paced: 1 %
Brady Statistic RV Percent Paced: 98.4 %
Date Time Interrogation Session: 20201022165756
Implantable Lead Implant Date: 20200925
Implantable Lead Implant Date: 20200925
Implantable Lead Implant Date: 20200925
Implantable Lead Location: 753859
Implantable Lead Location: 753860
Implantable Lead Location: 753862
Implantable Lead Model: 4398
Implantable Lead Model: 5076
Implantable Pulse Generator Implant Date: 20200925
Lead Channel Pacing Threshold Amplitude: 0.5 V
Lead Channel Pacing Threshold Amplitude: 1 V
Lead Channel Pacing Threshold Amplitude: 1.25 V
Lead Channel Pacing Threshold Pulse Width: 0.4 ms
Lead Channel Pacing Threshold Pulse Width: 0.4 ms
Lead Channel Pacing Threshold Pulse Width: 0.4 ms
Lead Channel Sensing Intrinsic Amplitude: 4.3 mV
Lead Channel Sensing Intrinsic Amplitude: 6.6 mV

## 2018-11-29 NOTE — Progress Notes (Signed)
Wound check appointment. Steri-strips removed. Wound without redness or edema. Incision edges approximated, wound well healed. Normal device function. Thresholds, sensing, and impedances consistent with implant measurements. Device programmed at 3.5V for extra safety margin until 3 month visit. Histogram distribution appropriate for patient and level of activity. No mode switches or ventricular arrhythmias noted. Patient educated about wound care, arm mobility, lifting restrictions, shock plan. ROV in 3 months with Dr Curt Bears on 02/12/19. Next remote transmission scheduled for4/7/21.Medtronic Cobalt device report scanned in.

## 2018-11-29 NOTE — Telephone Encounter (Signed)
Patient Advocate Encounter   Received notification from [employer] [employer] that prior authorization for Beverly Andrews is required.   PA submitted on CoverMyMeds Key [id number] Status is pending   Will continue to follow.  Charlann Boxer, CPhT

## 2018-11-30 NOTE — Telephone Encounter (Signed)
Advanced Heart Failure Patient Advocate Encounter  Prior Authorization for Delene Loll has been approved.    PA# [id number] Effective dates: 11/29/2018 through 11/29/2019  Patients co-pay is $10 with copay card. She last picked up a 90 day supply from [facility] on 11/03/2018.  Charlann Boxer, CPhT

## 2019-01-17 ENCOUNTER — Telehealth: Payer: Self-pay | Admitting: Cardiology

## 2019-01-17 NOTE — Telephone Encounter (Signed)
  Patient wanted to let Dr Curt Bears know that she tested positive for Covid on Tuesday 01/15/19

## 2019-02-06 ENCOUNTER — Other Ambulatory Visit (HOSPITAL_COMMUNITY): Payer: Self-pay | Admitting: Internal Medicine

## 2019-02-12 ENCOUNTER — Other Ambulatory Visit: Payer: Self-pay

## 2019-02-12 ENCOUNTER — Ambulatory Visit: Payer: BC Managed Care – PPO | Admitting: Cardiology

## 2019-02-12 ENCOUNTER — Encounter: Payer: Self-pay | Admitting: Cardiology

## 2019-02-12 VITALS — BP 88/64 | HR 85 | Ht 67.0 in | Wt 168.8 lb

## 2019-02-12 DIAGNOSIS — I428 Other cardiomyopathies: Secondary | ICD-10-CM

## 2019-02-12 MED ORDER — SACUBITRIL-VALSARTAN 24-26 MG PO TABS: 1.0000 | ORAL_TABLET | Freq: Two times a day (BID) | ORAL | 6 refills | Status: DC

## 2019-02-12 NOTE — Progress Notes (Signed)
Electrophysiology Office Note   Date:  02/12/2019   ID:  Beverly Andrews, DOB January 25, 1956, MRN [id number]  PCP:  Donald Prose, MD  Cardiologist:  [place] Primary Electrophysiologist:  Chava Dulac Meredith Leeds, MD    No chief complaint on file.    History of Present Illness: Beverly Andrews is a 64 y.o. female who is being seen today for the evaluation of CHF at the request of Donald Prose, MD. Presenting today for electrophysiology evaluation.  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  She had a catheterization that showed normal coronary arteries.  Her main symptoms are fatigue and mild chest discomfort.  She does get short of breath with brisk walking.  She has no PND or orthopnea.  He had a cardiac MRI 08/23/2017 that showed an ejection fraction of 20 to 25% with a normal RV and normal atria.  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  He is status post Medtronic CRT-D implanted 11/02/2018.  Today, denies symptoms of palpitations, chest pain, shortness of breath, orthopnea, PND, lower extremity edema, claudication, dizziness, presyncope, syncope, bleeding, or neurologic sequela. The patient is tolerating medications without difficulties.  Her main complaint is of weakness and fatigue.  She has noted that her blood pressure has been low.  She did have Covid in December but has gotten over those issues.  She does remain weak and fatigued.   Past Medical History:  Diagnosis Date  . Alcohol abuse   . Gout   . History of cardiac cath 09/24/2017   09/24/17: normal coronary arteries, EF <25%  . Hypertension   . Non-ischemic cardiomyopathy ([place]) 09/18/2017   09/18/17- EF 20-25%  . Recurrent pneumonia    Past Surgical History:  Procedure Laterality Date  . BIV ICD INSERTION CRT-D N/A 11/02/2018   Procedure: BIV ICD INSERTION CRT-D;  Surgeon: Constance Haw, MD;  Location: [place] [facility];  Service:  Cardiovascular;  Laterality: N/A;  . KNEE ARTHROSCOPY    . RIGHT/LEFT HEART CATH AND CORONARY ANGIOGRAPHY N/A 09/20/2017   Procedure: RIGHT/LEFT HEART CATH AND CORONARY ANGIOGRAPHY;  Surgeon: Wellington Hampshire, MD;  Location: [place] [facility];  Service: Cardiovascular;  Laterality: N/A;  . TONSILLECTOMY       Current Outpatient Medications  Medication Sig Dispense Refill  . acetaminophen (TYLENOL) 500 MG tablet Take 1,000 mg by mouth every 6 (six) hours as needed for moderate pain or headache.    . albuterol (PROVENTIL HFA;VENTOLIN HFA) 108 (90 Base) MCG/ACT inhaler Inhale 1-2 puffs into the lungs every 6 (six) hours as needed for wheezing or shortness of breath.    . Ascorbic Acid (VITAMIN C PO) Take 1 tablet by mouth daily.    Marland Kitchen BIOTIN PO Take 500 mcg by mouth daily.    . carvedilol (COREG) 3.125 MG tablet Take 1 tablet (3.125 mg total) by mouth 2 (two) times daily with a meal. 180 tablet 1  . Cholecalciferol (VITAMIN D) 50 MCG (2000 UT) tablet Take 2,000 Units by mouth daily.    . colchicine 0.6 MG tablet Take 0.6 mg by mouth daily as needed (gout flare up).    . COLLAGEN PO Take 1 tablet by mouth daily.    . diphenhydrAMINE (BENADRYL) 25 MG tablet Take 75 mg by mouth at bedtime as needed for sleep.     . furosemide (LASIX) 20 MG tablet TAKE 2 TABLETS BY MOUTH ONCE DAILY THEN  TAKE  AN  ADDITIONAL  TABLET  IF  WEIGHT  INCREASES  BY  2  TO  3  POUNDS. 190 tablet 3  . Multiple Vitamin (MULTIVITAMIN WITH MINERALS) TABS tablet Take 1 tablet by mouth daily. 90 tablet 3  . potassium chloride SA (K-DUR,KLOR-CON) 20 MEQ tablet TAKE 1 TABLET BY MOUTH ONCE DAILY 90 tablet 3  . sacubitril-valsartan (ENTRESTO) 49-51 MG Take 1 tablet by mouth 2 (two) times daily. 60 tablet 2  . spironolactone (ALDACTONE) 25 MG tablet Take 1 tablet (25 mg total) by mouth at bedtime. 90 tablet 0  . vitamin B-12 (CYANOCOBALAMIN) 1000 MCG tablet Take 1,000 mcg by mouth daily.     No current facility-administered  medications for this visit.    Allergies:   Amoxicillin, Carbocaine [mepivacaine], Penicillins, and Shellfish allergy   Social History:  The patient  reports that she has quit smoking. She has never used smokeless tobacco. She reports current alcohol use of about 21.0 standard drinks of alcohol per week. She reports that she does not use drugs.   Family History:  The patient's family history includes AAA (abdominal aortic aneurysm) in her father; Aortic stenosis in her father; Prostate cancer in her father.    ROS:  Please see the history of present illness.   Otherwise, review of systems is positive for none.   All other systems are reviewed and negative.   PHYSICAL EXAM: VS:  Ht 5\' 7"  (1.702 m)   BMI 26.78 kg/m  , BMI Body mass index is 26.78 kg/m. GEN: Well nourished, well developed, in no acute distress  HEENT: normal  Neck: no JVD, carotid bruits, or masses Cardiac: RRR; no murmurs, rubs, or gallops,no edema  Respiratory:  clear to auscultation bilaterally, normal work of breathing GI: soft, nontender, nondistended, + BS MS: no deformity or atrophy  Skin: warm and dry, device site well healed Neuro:  Strength and sensation are intact Psych: euthymic mood, full affect  EKG:  EKG is ordered today. Personal review of the ekg ordered shows sinus rhythm, rate 85, ventricular paced  Personal review of the device interrogation today. Results in Paceart   Recent Labs: 10/29/2018: BUN 13; Creatinine, Ser 1.07; Hemoglobin 14.1; Platelets 174; Potassium 4.6; Sodium 141    Lipid Panel     Component Value Date/Time   CHOL 186 09/20/2017 0413   TRIG 71 09/20/2017 0413   HDL 73 09/20/2017 0413   CHOLHDL 2.5 09/20/2017 0413   VLDL 14 09/20/2017 0413   LDLCALC 99 09/20/2017 0413     Wt Readings from Last 3 Encounters:  11/03/18 171 lb (77.6 kg)  10/16/18 169 lb 6.4 oz (76.8 kg)  04/17/18 172 lb (78 kg)      Other studies Reviewed: Additional studies/ records that were  reviewed today include: TTE 03/26/18  Review of the above records today demonstrates:   1. The left ventricle has a 3D calculated ejection fraction of 30%. The cavity size was normal. There is mild concentric left ventricular hypertrophy. Left ventricular diastolic Doppler parameters are indeterminate The E/e' is 15.3. There is abnormal  septal motion consistent with left bundle branch block. Left ventricular diffuse hypokinesis.  2. The right ventricle has moderately reduced systolic function. The cavity was normal. There is no increase in right ventricular wall thickness. Right ventricular systolic pressure could not be assessed.  3. Trivial pericardial effusion is present.  4. The mitral valve is normal in structure. There is mild to moderate mitral annular calcification present.  5. The tricuspid valve  is normal in structure.  6. The aortic valve is tricuspid.  7. Right atrial pressure is estimated at 3 mmHg.  CMRI 10/25/17 1. Mildly dilated left ventricle with mild concentric hypertrophy and severely decreased systolic function (LVEF =). There is diffuse hypokinesis and pronounced paradoxical septal motion. There is a ratio of non-compacted to compacted myocardium of > 2.5 in the apical segments. No late gadolinium enhancement is seen in the left ventricular myocardium. 2. Normal right ventricular size, thickness and systolic function (LVEF = 47%). There are no regional wall motion abnormalities. 3.  Normal left and right atrial size. 4. Normal size of the aortic root, ascending aorta and pulmonary artery. 5. Mild mitral and trivial tricuspid regurgitation. 6.  Normal pericardium.  No pericardial effusion.  ASSESSMENT AND PLAN:  1.  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  She is now status post Medtronic CRT-D implanted 11/02/2018.  Device functioning  appropriately.  Pressure is quite low and she has been weak and fatigued.  We Safira Proffit cut her Entresto dose in half.  2.  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  Case discussed with primary cardiology  Current medicines are reviewed at length with the patient today.   The patient does not have concerns regarding her medicines.  The following changes were made today:  Decrease entresto  Labs/ tests ordered today include:  Orders Placed This Encounter  Procedures  . EKG 12-Lead    Disposition:   FU with Elvera Almario 9 months  Signed, Natahsa Marian Jorja Loa, MD  02/12/2019 2:39 PM     [facility] [address] [place] [address] [phone] (office) [phone] (fax)

## 2019-02-12 NOTE — Addendum Note (Signed)
Addended by: Baird Lyons on: 02/12/2019 02:54 PM   Modules accepted: Orders

## 2019-02-12 NOTE — Patient Instructions (Addendum)
Medication Instructions:  Your physician has recommended you make the following change in your medication: 1. DECREASE Entresto to 24/26 mg twice a day  *If you need a refill on your cardiac medications before your next appointment, please call your pharmacy*  Labwork: None ordered  Testing/Procedures: None ordered  Follow-Up: Remote monitoring is used to monitor your Pacemaker or ICD from home. This monitoring reduces the number of office visits required to check your device to one time per year. It allows [place] to keep an eye on the functioning of your device to ensure it is working properly. You are scheduled for a device check from home on 05/15/2019. You may send your transmission at any time that day. If you have a wireless device, the transmission will be sent automatically. After your physician reviews your transmission, you will receive a postcard with your next transmission date.  At [facility], you and your health needs are our priority.  As part of our continuing mission to provide you with exceptional heart care, we have created designated Provider Care Teams.  These Care Teams include your primary Cardiologist (physician) and Advanced Practice Providers (APPs -  Physician Assistants and Nurse Practitioners) who all work together to provide you with the care you need, when you need it.  You will need a follow up appointment in 9 months.  Please call our office 2 months in advance to schedule this appointment.  You may see Dr Elberta Fortis or one of the following Advanced Practice Providers on your designated Care Team:    Gypsy Balsam, NP  Francis Dowse, PA-C  Otilio Saber, [place]  Thank you for choosing [facility]!!   Dory Horn, RN [phone]  Any Other Special Instructions Will Be Listed Below (If Applicable).

## 2019-02-24 ENCOUNTER — Other Ambulatory Visit: Payer: Self-pay | Admitting: Physician Assistant

## 2019-02-27 ENCOUNTER — Other Ambulatory Visit (HOSPITAL_COMMUNITY): Payer: Self-pay

## 2019-02-27 MED ORDER — CARVEDILOL 3.125 MG PO TABS: 3.1250 mg | ORAL_TABLET | Freq: Two times a day (BID) | ORAL | 1 refills | Status: DC

## 2019-03-20 ENCOUNTER — Encounter (HOSPITAL_COMMUNITY): Payer: Self-pay | Admitting: Internal Medicine

## 2019-03-20 ENCOUNTER — Other Ambulatory Visit: Payer: Self-pay

## 2019-03-20 ENCOUNTER — Ambulatory Visit (HOSPITAL_COMMUNITY)
Admission: RE | Admit: 2019-03-20 | Discharge: 2019-03-20 | Disposition: A | Discharge status: Home / Self Care | Payer: BC Managed Care – PPO | Source: Ambulatory Visit | Attending: Internal Medicine | Admitting: Internal Medicine

## 2019-03-20 VITALS — BP 112/72 | HR 83 | Wt 172.0 lb

## 2019-03-20 DIAGNOSIS — R5383 Other fatigue: Secondary | ICD-10-CM | POA: Insufficient documentation

## 2019-03-20 DIAGNOSIS — F1010 Alcohol abuse, uncomplicated: Secondary | ICD-10-CM | POA: Diagnosis not present

## 2019-03-20 DIAGNOSIS — I110 Hypertensive heart disease with heart failure: Secondary | ICD-10-CM | POA: Diagnosis not present

## 2019-03-20 DIAGNOSIS — I5022 Chronic systolic (congestive) heart failure: Secondary | ICD-10-CM

## 2019-03-20 DIAGNOSIS — M109 Gout, unspecified: Secondary | ICD-10-CM | POA: Diagnosis not present

## 2019-03-20 DIAGNOSIS — Z880 Allergy status to penicillin: Secondary | ICD-10-CM | POA: Diagnosis not present

## 2019-03-20 DIAGNOSIS — R0683 Snoring: Secondary | ICD-10-CM

## 2019-03-20 DIAGNOSIS — I447 Left bundle-branch block, unspecified: Secondary | ICD-10-CM | POA: Diagnosis not present

## 2019-03-20 DIAGNOSIS — Z79899 Other long term (current) drug therapy: Secondary | ICD-10-CM | POA: Insufficient documentation

## 2019-03-20 DIAGNOSIS — Z87891 Personal history of nicotine dependence: Secondary | ICD-10-CM | POA: Insufficient documentation

## 2019-03-20 DIAGNOSIS — I428 Other cardiomyopathies: Secondary | ICD-10-CM | POA: Diagnosis not present

## 2019-03-20 DIAGNOSIS — R5382 Chronic fatigue, unspecified: Secondary | ICD-10-CM

## 2019-03-20 LAB — BASIC METABOLIC PANEL
Anion gap: 15 (ref 5–15)
BUN: 12 mg/dL (ref 8–23)
CO2: 23 mmol/L (ref 22–32)
Calcium: 10 mg/dL (ref 8.9–10.3)
Chloride: 102 mmol/L (ref 98–111)
Creatinine, Ser: 1.07 mg/dL — ABNORMAL HIGH (ref 0.44–1.00)
GFR calc Af Amer: >60 mL/min (ref >60)
GFR calc non Af Amer: 56 mL/min — ABNORMAL LOW (ref >60)
Glucose, Bld: 111 mg/dL — ABNORMAL HIGH (ref 70–99)
Potassium: 4.5 mmol/L (ref 3.5–5.1)
Sodium: 140 mmol/L (ref 135–145)

## 2019-03-20 LAB — CBC
HCT: 40 % (ref 36.0–46.0)
Hemoglobin: 13.9 g/dL (ref 12.0–15.0)
MCH: 38.4 pg — ABNORMAL HIGH (ref 26.0–34.0)
MCHC: 34.8 g/dL (ref 30.0–36.0)
MCV: 110.5 fL — ABNORMAL HIGH (ref 80.0–100.0)
Platelets: 151 10*3/uL (ref 150–400)
RBC: 3.62 MIL/uL — ABNORMAL LOW (ref 3.87–5.11)
RDW: 11.6 % (ref 11.5–15.5)
WBC: 4.7 10*3/uL (ref 4.0–10.5)
nRBC: 0 % (ref 0.0–0.2)

## 2019-03-20 LAB — BRAIN NATRIURETIC PEPTIDE: B Natriuretic Peptide: 167.2 pg/mL — ABNORMAL HIGH (ref 0.0–100.0)

## 2019-03-20 NOTE — Progress Notes (Addendum)
Patient Name: Beverly Andrews        DOB: [birth year]      Height: 5'7"    Weight:172lbs  Office Name:         Referring Provider:  Today's Date: 03/20/2019  Date:   STOP BANG RISK ASSESSMENT S (snore) Have you been told that you snore?     YES   T (tired) Are you often tired, fatigued, or sleepy during the day?   YES  O (obstruction) Do you stop breathing, choke, or gasp during sleep? NO   P (pressure) Do you have or are you being treated for high blood pressure? YES   B (BMI) Is your body index greater than 35 kg/m? NO   A (age) Are you 108 years old or older? YES   N (neck) Do you have a neck circumference greater than 16 inches?   NO   G (gender) Are you a female? NO   TOTAL STOP/BANG "YES" ANSWERS                                                                        For Office Use Only              Procedure Order Form    YES to 3+ Stop Bang questions OR two clinical symptoms - patient qualifies for WatchPAT (CPT 95800)     Submit: This Form + Patient Face Sheet + Clinical Note via CloudPAT or Fax: [phone]         Clinical Notes: Will consult Sleep Specialist and refer for management of therapy due to patient increased risk of Sleep Apnea. Ordering a sleep study due to the following two clinical symptoms: Excessive daytime sleepiness G47.10 / Gastroesophageal reflux K21.9 / Nocturia R35.1 / Morning Headaches G44.221 / Difficulty concentrating R41.840 / Memory problems or poor judgment G31.84 / Personality changes or irritability R45.4 / Loud snoring R06.83 / Depression F32.9 / Unrefreshed by sleep G47.8 / Impotence N52.9 / History of high blood pressure R03.0 / Insomnia G47.00    I understand that I am proceeding with a home sleep apnea test as ordered by my treating physician. I understand that untreated sleep apnea is a serious cardiovascular risk factor and it is my responsibility to perform the test and seek management for sleep apnea. I will be contacted with the  results and be managed for sleep apnea by a local sleep physician. I will be receiving equipment and further instructions from [facility]. I shall promptly ship back the equipment via the included mailing label. I understand my insurance will be billed for the test and as the patient I am responsible for any insurance related out-of-pocket costs incurred. I have been provided with written instructions and can call for additional video or telephonic instruction, with 24-hour availability of qualified personnel to answer any questions: Patient Help Desk [phone].  Patient Telemedicine Verbal Consent

## 2019-03-20 NOTE — Progress Notes (Signed)
ReDS Vest / Clip - 03/20/19 0959      ReDS Vest / Clip   Station Marker  D    Ruler Value  27    ReDS Value Range  Moderate volume overload    ReDS Actual Value  33    Anatomical Comments  sitting

## 2019-03-20 NOTE — Patient Instructions (Signed)
Lab work done today. We will notify you of any abnormal lab work. No news is good news!  EKG done today.  Your physician has requested that you have an echocardiogram. Echocardiography is a painless test that uses sound waves to create images of your heart. It provides your doctor with information about the size and shape of your heart and how well your heart's chambers and valves are working. This procedure takes approximately one hour. There are no restrictions for this procedure.  Your provider has recommended that you have a home sleep study.  [employer] is the company that does these test.  They will contact you by phone and must speak with you before they can ship the equipment.  Once they have spoken with you they will send the equipment right to your home with instructions on how to set it up.  Once you have completed the test simply box all the equipment back up and mail back to the company.  IF you have any questions or issues with the equipment please call the company directly at [phone].  If your test is positive for sleep apnea and you need a home CPAP machine you will be contacted by Dr Norris Cross office [facility]) to set this up.  Please follow up with the [facility] in 4 months.   At the [facility], you and your health needs are our priority. As part of our continuing mission to provide you with exceptional heart care, we have created designated Provider Care Teams. These Care Teams include your primary Cardiologist (physician) and Advanced Practice Providers (APPs- Physician Assistants and Nurse Practitioners) who all work together to provide you with the care you need, when you need it.   You may see any of the following providers on your designated Care Team at your next follow up: Marland Kitchen Dr Arvilla Meres . Dr Marca Ancona . Tonye Becket, NP . Robbie Lis, PA . Karle Plumber, PharmD   Please be sure to bring in all your  medications bottles to every appointment.

## 2019-03-20 NOTE — Progress Notes (Signed)
[facility] Note  PCP: Deatra James, MD PCP-Cardiologist: Thurmon Fair, MD   HPI: Beverly Andrews is a 64 y.o. female with chronic systolic CHF, NICM, LBBB.  Seen in hospital 09/18/17 with DOE and new LBBB. Echo with new systolic CHF as below. Cath with normal coronaries.  Underwent MDT CRT-D in 9/20   Today she returns for HF follow up. Was feeling very rundown but recently saw Dr. Elberta Fortis and Sherryll Burger decreased (BP 88/64) and now feeling better. Less fatigued. No edema, orthopnea or PND. Very fatigued during the day. Doesn't know if she snores. Drinking several cocktails every night.    ECHo 03/26/2018 EF 25-30%   Echo 09/19/17 LVEF 20-25%, LBBB, Mod central MR.   cMRI 10/24/17 with EF 20-25%, Normal RV, normal atria. Findings suspicious for non-compaction cardiomyopathy.   CPX 3/20 FVC 3.55 (101%)    FEV1 2.49 (91%)      Resting HR: 70 Peak HR: 150  (94% age predicted max HR)  BP rest: 104/70 BP peak: 148/60  Peak VO2: 22.1 (103% predicted peak VO2)  VE/VCO2 slope: 36  OUES: 1.62  Peak RER: 1.06  O2pulse: 11  (110% predicted O2pulse)  R/LHC 09/20/17 - Normal coronary arteries Hemodynamics (mmHg) RA mean 2 RV 31/0 PA 30/11 PCWP 11 AO 111/70 Cardiac Output (Fick) 5.72 Cardiac Index (Fick) 3.28  Review of systems complete and found to be negative unless listed in HPI.    Past Medical History:  Diagnosis Date  . Alcohol abuse   . Gout   . History of cardiac cath 09/24/2017   09/24/17: normal coronary arteries, EF <25%  . Hypertension   . Non-ischemic cardiomyopathy (HCC) 09/18/2017   09/18/17- EF 20-25%  . Recurrent pneumonia     Current Outpatient Medications  Medication Sig Dispense Refill  . acetaminophen (TYLENOL) 500 MG tablet Take 1,000 mg by mouth every 6 (six) hours as needed for moderate pain or headache.    . albuterol (PROVENTIL HFA;VENTOLIN HFA) 108 (90 Base) MCG/ACT inhaler Inhale 1-2 puffs into the lungs every 6 (six)  hours as needed for wheezing or shortness of breath.    . Ascorbic Acid (VITAMIN C PO) Take 1 tablet by mouth daily.    Marland Kitchen BIOTIN PO Take 500 mcg by mouth daily.    . carvedilol (COREG) 3.125 MG tablet Take 1 tablet (3.125 mg total) by mouth 2 (two) times daily with a meal. 180 tablet 1  . Cholecalciferol (VITAMIN D) 50 MCG (2000 UT) tablet Take 2,000 Units by mouth daily.    . colchicine 0.6 MG tablet Take 0.6 mg by mouth daily as needed (gout flare up).    . COLLAGEN PO Take 1 tablet by mouth daily.    . diphenhydrAMINE (BENADRYL) 25 MG tablet Take 75 mg by mouth at bedtime as needed for sleep.     . furosemide (LASIX) 20 MG tablet TAKE 2 TABLETS BY MOUTH ONCE DAILY THEN  TAKE  AN  ADDITIONAL  TABLET  IF  WEIGHT  INCREASES  BY  2  TO  3  POUNDS. 190 tablet 3  . Multiple Vitamin (MULTIVITAMIN WITH MINERALS) TABS tablet Take 1 tablet by mouth daily. 90 tablet 3  . potassium chloride SA (K-DUR,KLOR-CON) 20 MEQ tablet TAKE 1 TABLET BY MOUTH ONCE DAILY 90 tablet 3  . sacubitril-valsartan (ENTRESTO) 24-26 MG Take 1 tablet by mouth 2 (two) times daily. 60 tablet 6  . spironolactone (ALDACTONE) 25 MG tablet Take 1 tablet (25 mg total) by mouth at  bedtime. 90 tablet 0  . vitamin B-12 (CYANOCOBALAMIN) 1000 MCG tablet Take 1,000 mcg by mouth daily.     No current facility-administered medications for this encounter.    Allergies  Allergen Reactions  . Amoxicillin Itching and Swelling  . Carbocaine [Mepivacaine] Hives and Swelling  . Penicillins Itching and Swelling    Did it involve swelling of the face/tongue/throat, SOB, or low BP? Yes Did it involve sudden or severe rash/hives, skin peeling, or any reaction on the inside of your mouth or nose? Yes Did you need to seek medical attention at a hospital or doctor's office? No When did it last happen?10 + years If all above answers are "NO", may proceed with cephalosporin use.    . Shellfish Allergy Swelling      Social History    Socioeconomic History  . Marital status: Divorced    Spouse name: Not on file  . Number of children: Not on file  . Years of education: Not on file  . Highest education level: Not on file  Occupational History  . Occupation: Works at an [employer]  . Smoking status: Former Games developer  . Smokeless tobacco: Never Used  . Tobacco comment: quit 6 months ago  Substance and Sexual Activity  . Alcohol use: Yes    Alcohol/week: 21.0 standard drinks    Types: 21 Shots of liquor per week  . Drug use: Never  . Sexual activity: Not on file  Other Topics Concern  . Not on file  Social History Narrative  . Not on file   Social Determinants of Health   Financial Resource Strain:   . Difficulty of Paying Living Expenses: Not on file  Food Insecurity:   . Worried About Programme researcher, broadcasting/film/video in the Last Year: Not on file  . Ran Out of Food in the Last Year: Not on file  Transportation Needs:   . Lack of Transportation (Medical): Not on file  . Lack of Transportation (Non-Medical): Not on file  Physical Activity:   . Days of Exercise per Week: Not on file  . Minutes of Exercise per Session: Not on file  Stress:   . Feeling of Stress : Not on file  Social Connections:   . Frequency of Communication with Friends and Family: Not on file  . Frequency of Social Gatherings with Friends and Family: Not on file  . Attends Religious Services: Not on file  . Active Member of Clubs or Organizations: Not on file  . Attends Banker Meetings: Not on file  . Marital Status: Not on file  Intimate Partner Violence:   . Fear of Current or Ex-Partner: Not on file  . Emotionally Abused: Not on file  . Physically Abused: Not on file  . Sexually Abused: Not on file      Family History  Problem Relation Age of Onset  . AAA (abdominal aortic aneurysm) Father   . Aortic stenosis Father   . Prostate cancer Father     Vitals:   03/20/19 0927  BP: 112/72  Pulse: 83  SpO2: 96%   Weight: 78 kg (172 lb)    Wt Readings from Last 3 Encounters:  03/20/19 78 kg (172 lb)  02/12/19 76.6 kg (168 lb 12.8 oz)  11/03/18 77.6 kg (171 lb)   General:  Well appearing. No resp difficulty HEENT: normal Neck: supple. no JVD. Carotids 2+ bilat; no bruits. No lymphadenopathy or thryomegaly appreciated. Cor: PMI nondisplaced. Regular rate &  rhythm. No rubs, gallops or murmurs. Lungs: clear Abdomen: soft, nontender, nondistended. No hepatosplenomegaly. No bruits or masses. Good bowel sounds. Extremities: no cyanosis, clubbing, rash, edema Neuro: alert & orientedx3, cranial nerves grossly intact. moves all 4 extremities w/o difficulty. Affect pleasant    EKG: NSR 81 bpm v-paced 81 Personally reviewed   ASSESSMENT & PLAN:  1. 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 Findings suspicious for non-compaction cardiomyopathy. - ECHO 2/20  EF 25-30%. - s/p MDT CRT-D in 9/20  - Remains NYHA II-III.  - Volume status stable despite weight gain. - Continue coreg 3.125 mg BID - Continue entresto 24/26 mg twice a day  (recently decreased due to severe hypotension) - Continue spiro 25 mg daily.  - No SGLT 2i yet with low BP  - With recent low BP with decrease lasix to 40mg  every other day and take extra as needed in between   - Check BMET, BNP - Echo to reassess EF after CRT implant  2. LBBB - s/p MDT CRT-P in 9/20  3. ETOH abuse - continues to drink to excess. Encouraged to stop drinking.   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 Offered genetic screening but she wants to defere for now due to cost - No evidence of arrythmias thus far. Will follow closely for need for [facility].   5. Daytime fatigue - high risk for OSA. Check PSG   Glori Bickers, MD 03/20/19

## 2019-03-22 NOTE — Progress Notes (Signed)
Order, OV note, stop bang and demographics all faxed to [employer] at [phone] via epic  

## 2019-04-10 ENCOUNTER — Ambulatory Visit (HOSPITAL_COMMUNITY)
Admission: RE | Admit: 2019-04-10 | Discharge: 2019-04-10 | Disposition: A | Discharge status: Home / Self Care | Payer: BC Managed Care – PPO | Source: Ambulatory Visit | Attending: Internal Medicine | Admitting: Internal Medicine

## 2019-04-10 ENCOUNTER — Other Ambulatory Visit: Payer: Self-pay

## 2019-04-10 DIAGNOSIS — I5022 Chronic systolic (congestive) heart failure: Secondary | ICD-10-CM | POA: Diagnosis not present

## 2019-04-10 DIAGNOSIS — I110 Hypertensive heart disease with heart failure: Secondary | ICD-10-CM | POA: Insufficient documentation

## 2019-04-10 NOTE — Progress Notes (Signed)
  Echocardiogram 2D Echocardiogram has been performed.  Opal Mckellips G Idalee Foxworthy 04/10/2019, 11:40 AM

## 2019-04-24 ENCOUNTER — Encounter (HOSPITAL_COMMUNITY): Payer: Self-pay

## 2019-05-03 ENCOUNTER — Other Ambulatory Visit: Payer: Self-pay | Admitting: Cardiology

## 2019-05-06 ENCOUNTER — Telehealth: Payer: Self-pay | Admitting: *Deleted

## 2019-05-06 NOTE — Telephone Encounter (Signed)
Patients home sleep study was denied, reason: the health records show you have a medical condition (s) that may alter ventilation (how you breathe) during the study: chronic cardiopulmonary disease (congestive heart failure). No pre authorization required for in lab study.

## 2019-05-29 ENCOUNTER — Other Ambulatory Visit (HOSPITAL_COMMUNITY): Payer: Self-pay | Admitting: Internal Medicine

## 2019-06-11 ENCOUNTER — Telehealth: Payer: Self-pay

## 2019-06-11 NOTE — Telephone Encounter (Signed)
Patient called stating that she got some notification on her phone about her device and she isnt sure what it said and she is not able to bring it back up. Patient has not transmitted in a while and missed remote checks. I advised patient when she gets home to go ahead and send a remote transmission this way we can see if something is wrong and get her back on schedule with her remotes. Pt understood and says she will do it as soon as she gets home.

## 2019-07-09 ENCOUNTER — Telehealth: Payer: Self-pay

## 2019-07-09 NOTE — Telephone Encounter (Signed)
Patient overdue for remote ICD monitoring.  She either needs to send transmission or schedule in-clinic appt.  Attempted to call pt to assist with transmission, no answer/ VM full unable to leave a message.  Sending to scheduling to set patient up with device clinic or APP app for July 2021 which will be 6 montsh after last visit.

## 2019-07-19 ENCOUNTER — Ambulatory Visit (HOSPITAL_COMMUNITY)
Admission: RE | Admit: 2019-07-19 | Discharge: 2019-07-19 | Disposition: A | Discharge status: Home / Self Care | Payer: BC Managed Care – PPO | Source: Ambulatory Visit | Attending: Internal Medicine | Admitting: Internal Medicine

## 2019-07-19 ENCOUNTER — Encounter (HOSPITAL_COMMUNITY): Payer: Self-pay | Admitting: Internal Medicine

## 2019-07-19 ENCOUNTER — Other Ambulatory Visit: Payer: Self-pay

## 2019-07-19 VITALS — BP 132/88 | HR 77 | Ht 67.0 in | Wt 170.4 lb

## 2019-07-19 DIAGNOSIS — R5383 Other fatigue: Secondary | ICD-10-CM | POA: Insufficient documentation

## 2019-07-19 DIAGNOSIS — I5022 Chronic systolic (congestive) heart failure: Secondary | ICD-10-CM

## 2019-07-19 DIAGNOSIS — R5382 Chronic fatigue, unspecified: Secondary | ICD-10-CM

## 2019-07-19 DIAGNOSIS — Z87891 Personal history of nicotine dependence: Secondary | ICD-10-CM | POA: Insufficient documentation

## 2019-07-19 DIAGNOSIS — I428 Other cardiomyopathies: Secondary | ICD-10-CM | POA: Insufficient documentation

## 2019-07-19 DIAGNOSIS — M109 Gout, unspecified: Secondary | ICD-10-CM | POA: Insufficient documentation

## 2019-07-19 DIAGNOSIS — Z7901 Long term (current) use of anticoagulants: Secondary | ICD-10-CM | POA: Insufficient documentation

## 2019-07-19 DIAGNOSIS — I110 Hypertensive heart disease with heart failure: Secondary | ICD-10-CM | POA: Insufficient documentation

## 2019-07-19 DIAGNOSIS — I447 Left bundle-branch block, unspecified: Secondary | ICD-10-CM | POA: Insufficient documentation

## 2019-07-19 DIAGNOSIS — Z79899 Other long term (current) drug therapy: Secondary | ICD-10-CM | POA: Insufficient documentation

## 2019-07-19 DIAGNOSIS — R0683 Snoring: Secondary | ICD-10-CM | POA: Diagnosis not present

## 2019-07-19 LAB — BASIC METABOLIC PANEL
Anion gap: 10 (ref 5–15)
BUN: 11 mg/dL (ref 8–23)
CO2: 21 mmol/L — ABNORMAL LOW (ref 22–32)
Calcium: 9.4 mg/dL (ref 8.9–10.3)
Chloride: 110 mmol/L (ref 98–111)
Creatinine, Ser: 1.15 mg/dL — ABNORMAL HIGH (ref 0.44–1.00)
GFR calc Af Amer: 59 mL/min — ABNORMAL LOW (ref >60)
GFR calc non Af Amer: 51 mL/min — ABNORMAL LOW (ref >60)
Glucose, Bld: 100 mg/dL — ABNORMAL HIGH (ref 70–99)
Potassium: 4.8 mmol/L (ref 3.5–5.1)
Sodium: 141 mmol/L (ref 135–145)

## 2019-07-19 NOTE — Patient Instructions (Addendum)
Labs done today, we will call you for abnormal results  Your physician has recommended that you have a sleep study. This test records several body functions during sleep, including: brain activity, eye movement, oxygen and carbon dioxide blood levels, heart rate and rhythm, breathing rate and rhythm, the flow of air through your mouth and nose, snoring, body muscle movements, and chest and belly movement.  Please call our office in March 2022 to schedule your follow up appointment  If you have any questions or concerns before your next appointment please send [place] a message through [place] or call our office at [phone].    TO LEAVE A MESSAGE FOR THE NURSE SELECT OPTION 2, PLEASE LEAVE A MESSAGE INCLUDING: . YOUR NAME . DATE OF BIRTH . CALL BACK NUMBER . REASON FOR CALL**this is important as we prioritize the call backs  YOU WILL RECEIVE A CALL BACK THE SAME DAY AS LONG AS YOU CALL BEFORE 4:00 PM  At the [facility], you and your health needs are our priority. As part of our continuing mission to provide you with exceptional heart care, we have created designated Provider Care Teams. These Care Teams include your primary Cardiologist (physician) and Advanced Practice Providers (APPs- Physician Assistants and Nurse Practitioners) who all work together to provide you with the care you need, when you need it.   You may see any of the following providers on your designated Care Team at your next follow up: Marland Kitchen Dr Arvilla Meres . Dr Marca Ancona . Tonye Becket, NP . Robbie Lis, PA . Karle Plumber, PharmD   Please be sure to bring in all your medications bottles to every appointment.

## 2019-07-19 NOTE — Addendum Note (Signed)
Encounter addended by: Noralee Space, RN on: 07/19/2019 12:06 PM  Actions taken: Clinical Note Signed, Charge Capture section accepted, Order list changed, Diagnosis association updated

## 2019-07-19 NOTE — Addendum Note (Signed)
Encounter addended by: Noralee Space, RN on: 07/19/2019 12:16 PM  Actions taken: Clinical Note Signed, Order list changed, Diagnosis association updated

## 2019-07-19 NOTE — Progress Notes (Signed)
[facility] Note  PCP: Deatra James, MD PCP-Cardiologist: Thurmon Fair, MD   HPI: Beverly Andrews is a 64 y.o. female with chronic systolic CHF, NICM, LBBB.  Seen in hospital 09/18/17 with DOE and new LBBB. Echo with new systolic CHF as below. Cath with normal coronaries.  Underwent MDT CRT-D in 9/20   Today she returns for HF follow up. Had Covid in 12/20 but was very mild. Was feeling very rundown but saw Dr. Elberta Fortis and Sherryll Burger decreased (BP 88/64). Says she feels good but is tired. Chronic DOE. No orthopnea or PND. Works PT at [employer]. No edema, orthopnea or PND. Taken lasix intermittently.   ICD interrogated: NO VT/AF. No ICD Activity 7.4 hr/day  VP 98.4% Personally reviewed  Echo EF 55% normal RV Personally reviewed  Cardiac studies:  ECHo 03/26/2018 EF 25-30%   Echo 09/19/17 LVEF 20-25%, LBBB, Mod central MR.   cMRI 10/24/17 with EF 20-25%, Normal RV, normal atria. Findings suspicious for non-compaction cardiomyopathy.   CPX 3/20 FVC 3.55 (101%)    FEV1 2.49 (91%)      Resting HR: 70 Peak HR: 150  (94% age predicted max HR)  BP rest: 104/70 BP peak: 148/60  Peak VO2: 22.1 (103% predicted peak VO2)  VE/VCO2 slope: 36  OUES: 1.62  Peak RER: 1.06  O2pulse: 11  (110% predicted O2pulse)  R/LHC 09/20/17 - Normal coronary arteries Hemodynamics (mmHg) RA mean 2 RV 31/0 PA 30/11 PCWP 11 AO 111/70 Cardiac Output (Fick) 5.72 Cardiac Index (Fick) 3.28  Review of systems complete and found to be negative unless listed in HPI.    Past Medical History:  Diagnosis Date  . Alcohol abuse   . Gout   . History of cardiac cath 09/24/2017   09/24/17: normal coronary arteries, EF <25%  . Hypertension   . Non-ischemic cardiomyopathy (HCC) 09/18/2017   09/18/17- EF 20-25%  . Recurrent pneumonia     Current Outpatient Medications  Medication Sig Dispense Refill  . acetaminophen (TYLENOL) 500 MG tablet Take 1,000 mg by mouth every 6 (six) hours as  needed for moderate pain or headache.    . albuterol (PROVENTIL HFA;VENTOLIN HFA) 108 (90 Base) MCG/ACT inhaler Inhale 1-2 puffs into the lungs every 6 (six) hours as needed for wheezing or shortness of breath.    . Ascorbic Acid (VITAMIN C PO) Take 1 tablet by mouth daily.    Marland Kitchen BIOTIN PO Take 500 mcg by mouth daily.    . carvedilol (COREG) 3.125 MG tablet Take 1 tablet (3.125 mg total) by mouth 2 (two) times daily with a meal. 180 tablet 1  . Cholecalciferol (VITAMIN D) 50 MCG (2000 UT) tablet Take 2,000 Units by mouth daily.    . colchicine 0.6 MG tablet Take 0.6 mg by mouth daily as needed (gout flare up).    . COLLAGEN PO Take 1 tablet by mouth daily.    . diphenhydrAMINE (BENADRYL) 25 MG tablet Take 75 mg by mouth at bedtime as needed for sleep.     . furosemide (LASIX) 20 MG tablet TAKE 2 TABLETS BY MOUTH ONCE DAILY THEN  TAKE  AN  ADDITIONAL  TABLET  IF  WEIGHT  INCREASES  BY  2  TO  3  POUNDS. 190 tablet 3  . Multiple Vitamin (MULTIVITAMIN WITH MINERALS) TABS tablet Take 1 tablet by mouth daily. 90 tablet 3  . potassium chloride SA (KLOR-CON) 20 MEQ tablet Take 1 tablet by mouth once daily 90 tablet 3  .  sacubitril-valsartan (ENTRESTO) 24-26 MG Take 1 tablet by mouth 2 (two) times daily. 60 tablet 6  . spironolactone (ALDACTONE) 25 MG tablet TAKE 1 TABLET BY MOUTH AT BEDTIME 90 tablet 0  . vitamin B-12 (CYANOCOBALAMIN) 1000 MCG tablet Take 1,000 mcg by mouth daily.     No current facility-administered medications for this encounter.    Allergies  Allergen Reactions  . Amoxicillin Itching and Swelling  . Carbocaine [Mepivacaine] Hives and Swelling  . Penicillins Itching and Swelling    Did it involve swelling of the face/tongue/throat, SOB, or low BP? Yes Did it involve sudden or severe rash/hives, skin peeling, or any reaction on the inside of your mouth or nose? Yes Did you need to seek medical attention at a hospital or doctor's office? No When did it last happen?10 +  years If all above answers are "NO", may proceed with cephalosporin use.    . Shellfish Allergy Swelling      Social History   Socioeconomic History  . Marital status: Divorced    Spouse name: Not on file  . Number of children: Not on file  . Years of education: Not on file  . Highest education level: Not on file  Occupational History  . Occupation: Works at an [employer]  . Smoking status: Former Games developer  . Smokeless tobacco: Never Used  . Tobacco comment: quit 6 months ago  Vaping Use  . Vaping Use: Never used  Substance and Sexual Activity  . Alcohol use: Yes    Alcohol/week: 2.0 standard drinks    Types: 2 Standard drinks or equivalent per week  . Drug use: Never  . Sexual activity: Not on file  Other Topics Concern  . Not on file  Social History Narrative  . Not on file   Social Determinants of Health   Financial Resource Strain:   . Difficulty of Paying Living Expenses:   Food Insecurity:   . Worried About Programme researcher, broadcasting/film/video in the Last Year:   . Barista in the Last Year:   Transportation Needs:   . Freight forwarder (Medical):   Marland Kitchen Lack of Transportation (Non-Medical):   Physical Activity:   . Days of Exercise per Week:   . Minutes of Exercise per Session:   Stress:   . Feeling of Stress :   Social Connections:   . Frequency of Communication with Friends and Family:   . Frequency of Social Gatherings with Friends and Family:   . Attends Religious Services:   . Active Member of Clubs or Organizations:   . Attends Banker Meetings:   Marland Kitchen Marital Status:   Intimate Partner Violence:   . Fear of Current or Ex-Partner:   . Emotionally Abused:   Marland Kitchen Physically Abused:   . Sexually Abused:       Family History  Problem Relation Age of Onset  . AAA (abdominal aortic aneurysm) Father   . Aortic stenosis Father   . Prostate cancer Father     Vitals:   07/19/19 1122  BP: 132/88  Pulse: 77  SpO2: 96%  Weight:  77.3 kg (170 lb 6.4 oz)  Height: 5\' 7"  (1.702 m)    Wt Readings from Last 3 Encounters:  07/19/19 77.3 kg (170 lb 6.4 oz)  03/20/19 78 kg (172 lb)  02/12/19 76.6 kg (168 lb 12.8 oz)   General:  Well appearing. No resp difficulty HEENT: normal Neck: supple. no JVD. Carotids 2+ bilat;  no bruits. No lymphadenopathy or thryomegaly appreciated. Cor: PMI nondisplaced. Regular rate & rhythm. No rubs, gallops or murmurs. Lungs: clear Abdomen: soft, nontender, nondistended. No hepatosplenomegaly. No bruits or masses. Good bowel sounds. Extremities: no cyanosis, clubbing, rash, edema Neuro: alert & orientedx3, cranial nerves grossly intact. moves all 4 extremities w/o difficulty. Affect pleasant  ASSESSMENT & PLAN:  1. 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 Findings suspicious for non-compaction cardiomyopathy. - ECHO 2/20  EF 25-30%. - s/p MDT CRT-D in 9/20  - Echo 3/21 EF 55% complete recovery with CRT - Remains NYHA II - Volume status ok. Use lasix only PRN - Continue coreg 3.125 mg BID - Continue entresto 24/26 mg twice a day  (recently decreased due to severe hypotension) - Continue spiro 25 mg daily.  - Check labs   2. LBBB - s/p MDT CRT-P in 9/20 - EF improved  3. 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 Offered genetic screening but she wants to defere for now due to cost - No evidence of arrythmias thus far. Will follow closely for need for [facility].   5. Daytime fatigue - high risk for OSA.  - will order sleep study   Glori Bickers, MD 07/19/19

## 2019-07-19 NOTE — Progress Notes (Signed)
Height:  5'7"    Weight: 170 lb BMI: 26.69  Today's Date: 07/19/19  STOP BANG RISK ASSESSMENT S (snore) Have you been told that you snore?     YES   T (tired) Are you often tired, fatigued, or sleepy during the day?   YES  O (obstruction) Do you stop breathing, choke, or gasp during sleep? NO   P (pressure) Do you have or are you being treated for high blood pressure? YES   B (BMI) Is your body index greater than 35 kg/m? NO   A (age) Are you 63 years old or older? YES   N (neck) Do you have a neck circumference greater than 16 inches?      G (gender) Are you a female? NO   TOTAL STOP/BANG "YES" ANSWERS 4                                                                       For Office Use Only              Procedure Order Form    YES to 3+ Stop Bang questions OR two clinical symptoms - patient qualifies for WatchPAT (CPT 95800)      Clinical Notes: Will consult Sleep Specialist and refer for management of therapy due to patient increased risk of Sleep Apnea. Ordering a sleep study due to the following two clinical symptoms: Excessive daytime sleepiness G47.10 / Loud snoring R06.83

## 2019-07-19 NOTE — Addendum Note (Signed)
Encounter addended by: Dolores Patty, MD on: 07/19/2019 11:57 AM  Actions taken: Charge Capture section accepted

## 2019-07-19 NOTE — Addendum Note (Signed)
Encounter addended by: Noralee Space, RN on: 07/19/2019 1:30 PM  Actions taken: Clinical Note Signed

## 2019-08-05 ENCOUNTER — Other Ambulatory Visit (HOSPITAL_COMMUNITY)
Admission: RE | Admit: 2019-08-05 | Discharge: 2019-08-05 | Disposition: A | Discharge status: Home / Self Care | Payer: BC Managed Care – PPO | Source: Ambulatory Visit | Attending: Cardiology | Admitting: Cardiology

## 2019-08-05 DIAGNOSIS — Z01812 Encounter for preprocedural laboratory examination: Secondary | ICD-10-CM | POA: Diagnosis not present

## 2019-08-05 DIAGNOSIS — Z20822 Contact with and (suspected) exposure to covid-19: Secondary | ICD-10-CM | POA: Insufficient documentation

## 2019-08-05 LAB — SARS CORONAVIRUS 2 (TAT 6-24 HRS): SARS Coronavirus 2: NEGATIVE

## 2019-08-07 ENCOUNTER — Encounter (HOSPITAL_BASED_OUTPATIENT_CLINIC_OR_DEPARTMENT_OTHER): Payer: BC Managed Care – PPO | Admitting: Cardiology

## 2019-08-19 ENCOUNTER — Ambulatory Visit: Payer: BC Managed Care – PPO | Admitting: Student

## 2019-08-19 ENCOUNTER — Encounter: Payer: Self-pay | Admitting: Student

## 2019-08-19 ENCOUNTER — Other Ambulatory Visit: Payer: Self-pay

## 2019-08-19 VITALS — BP 118/68 | HR 79 | Ht 67.0 in | Wt 171.0 lb

## 2019-08-19 DIAGNOSIS — I447 Left bundle-branch block, unspecified: Secondary | ICD-10-CM | POA: Diagnosis not present

## 2019-08-19 DIAGNOSIS — I5022 Chronic systolic (congestive) heart failure: Secondary | ICD-10-CM | POA: Diagnosis not present

## 2019-08-19 LAB — CUP PACEART INCLINIC DEVICE CHECK
Date Time Interrogation Session: 20210712144208
Implantable Lead Implant Date: 20200925
Implantable Lead Implant Date: 20200925
Implantable Lead Implant Date: 20200925
Implantable Lead Location: 753859
Implantable Lead Location: 753860
Implantable Lead Location: 753862
Implantable Lead Model: 4398
Implantable Lead Model: 5076
Implantable Pulse Generator Implant Date: 20200925

## 2019-08-19 NOTE — Progress Notes (Signed)
Electrophysiology Office Note Date: 08/19/2019  ID:  Beverly Andrews, DOB [birth year], MRN [id number]  PCP: Deatra James, MD Primary Cardiologist: Thurmon Fair, MD Electrophysiologist: Regan Lemming, MD   CC: Routine ICD follow-up  Beverly Andrews is a 64 y.o. female seen today for Will Jorja Loa, MD for routine electrophysiology followup.  Since last being seen in our clinic the patient reports doing very well.  she denies chest pain, palpitations, dyspnea, PND, orthopnea, nausea, vomiting, dizziness, syncope, edema, weight gain, or early satiety. She has not had ICD shocks.   Device History: Medtronic BiV ICD implanted 10/2018 for NICM, LBBB  Past Medical History:  Diagnosis Date  . Alcohol abuse   . Gout   . History of cardiac cath 09/24/2017   09/24/17: normal coronary arteries, EF <25%  . Hypertension   . Non-ischemic cardiomyopathy (HCC) 09/18/2017   09/18/17- EF 20-25%  . Recurrent pneumonia    Past Surgical History:  Procedure Laterality Date  . BIV ICD INSERTION CRT-D N/A 11/02/2018   Procedure: BIV ICD INSERTION CRT-D;  Surgeon: Regan Lemming, MD;  Location: [facility] INVASIVE CV LAB;  Service: Cardiovascular;  Laterality: N/A;  . KNEE ARTHROSCOPY    . RIGHT/LEFT HEART CATH AND CORONARY ANGIOGRAPHY N/A 09/20/2017   Procedure: RIGHT/LEFT HEART CATH AND CORONARY ANGIOGRAPHY;  Surgeon: Iran Ouch, MD;  Location: [facility] INVASIVE CV LAB;  Service: Cardiovascular;  Laterality: N/A;  . TONSILLECTOMY      Current Outpatient Medications  Medication Sig Dispense Refill  . acetaminophen (TYLENOL) 500 MG tablet Take 1,000 mg by mouth every 6 (six) hours as needed for moderate pain or headache.    . albuterol (PROVENTIL HFA;VENTOLIN HFA) 108 (90 Base) MCG/ACT inhaler Inhale 1-2 puffs into the lungs every 6 (six) hours as needed for wheezing or shortness of breath.    . Ascorbic Acid (VITAMIN C PO) Take 1 tablet by mouth daily.    Marland Kitchen BIOTIN PO Take 500 mcg by mouth  daily.    . carvedilol (COREG) 3.125 MG tablet Take 1 tablet (3.125 mg total) by mouth 2 (two) times daily with a meal. 180 tablet 1  . Cholecalciferol (VITAMIN D) 50 MCG (2000 UT) tablet Take 2,000 Units by mouth daily.    . colchicine 0.6 MG tablet Take 0.6 mg by mouth daily as needed (gout flare up).    . COLLAGEN PO Take 1 tablet by mouth daily.    . diphenhydrAMINE (BENADRYL) 25 MG tablet Take 75 mg by mouth at bedtime as needed for sleep.     . furosemide (LASIX) 20 MG tablet TAKE 2 TABLETS BY MOUTH ONCE DAILY THEN  TAKE  AN  ADDITIONAL  TABLET  IF  WEIGHT  INCREASES  BY  2  TO  3  POUNDS. 190 tablet 3  . Multiple Vitamin (MULTIVITAMIN WITH MINERALS) TABS tablet Take 1 tablet by mouth daily. 90 tablet 3  . potassium chloride SA (KLOR-CON) 20 MEQ tablet Take 1 tablet by mouth once daily 90 tablet 3  . sacubitril-valsartan (ENTRESTO) 24-26 MG Take 1 tablet by mouth 2 (two) times daily. 60 tablet 6  . spironolactone (ALDACTONE) 25 MG tablet TAKE 1 TABLET BY MOUTH AT BEDTIME 90 tablet 0  . vitamin B-12 (CYANOCOBALAMIN) 1000 MCG tablet Take 1,000 mcg by mouth daily.     No current facility-administered medications for this visit.    Allergies:   Amoxicillin, Carbocaine [mepivacaine], Penicillins, and Shellfish allergy   Social History: Social History   Socioeconomic History  .  Marital status: Divorced    Spouse name: Not on file  . Number of children: Not on file  . Years of education: Not on file  . Highest education level: Not on file  Occupational History  . Occupation: Works at an [employer]  . Smoking status: Former Games developer  . Smokeless tobacco: Never Used  . Tobacco comment: quit 6 months ago  Vaping Use  . Vaping Use: Never used  Substance and Sexual Activity  . Alcohol use: Yes    Alcohol/week: 2.0 standard drinks    Types: 2 Standard drinks or equivalent per week  . Drug use: Never  . Sexual activity: Not on file  Other Topics Concern  . Not on file    Social History Narrative  . Not on file   Social Determinants of Health   Financial Resource Strain:   . Difficulty of Paying Living Expenses:   Food Insecurity:   . Worried About Programme researcher, broadcasting/film/video in the Last Year:   . Barista in the Last Year:   Transportation Needs:   . Freight forwarder (Medical):   Marland Kitchen Lack of Transportation (Non-Medical):   Physical Activity:   . Days of Exercise per Week:   . Minutes of Exercise per Session:   Stress:   . Feeling of Stress :   Social Connections:   . Frequency of Communication with Friends and Family:   . Frequency of Social Gatherings with Friends and Family:   . Attends Religious Services:   . Active Member of Clubs or Organizations:   . Attends Banker Meetings:   Marland Kitchen Marital Status:   Intimate Partner Violence:   . Fear of Current or Ex-Partner:   . Emotionally Abused:   Marland Kitchen Physically Abused:   . Sexually Abused:     Family History: Family History  Problem Relation Age of Onset  . AAA (abdominal aortic aneurysm) Father   . Aortic stenosis Father   . Prostate cancer Father     Review of Systems: All other systems reviewed and are otherwise negative except as noted above.   Physical Exam: There were no vitals filed for this visit.   GEN- The patient is well appearing, alert and oriented x 3 today.   HEENT: normocephalic, atraumatic; sclera clear, conjunctiva pink; hearing intact; oropharynx clear; neck supple, no JVP Lymph- no cervical lymphadenopathy Lungs- Clear to ausculation bilaterally, normal work of breathing.  No wheezes, rales, rhonchi Heart- Regular rate and rhythm, no murmurs, rubs or gallops, PMI not laterally displaced GI- soft, non-tender, non-distended, bowel sounds present, no hepatosplenomegaly Extremities- no clubbing, cyanosis, or edema; DP/PT/radial pulses 2+ bilaterally MS- no significant deformity or atrophy Skin- warm and dry, no rash or lesion; ICD pocket well  healed Psych- euthymic mood, full affect Neuro- strength and sensation are intact  ICD interrogation- reviewed in detail today,  See PACEART report  EKG:  EKG is not ordered today.  Recent Labs: 03/20/2019: B Natriuretic Peptide 167.2; Hemoglobin 13.9; Platelets 151 07/19/2019: BUN 11; Creatinine, Ser 1.15; Potassium 4.8; Sodium 141   Wt Readings from Last 3 Encounters:  07/19/19 170 lb 6.4 oz (77.3 kg)  03/20/19 172 lb (78 kg)  02/12/19 168 lb 12.8 oz (76.6 kg)     Other studies Reviewed: Additional studies/ records that were reviewed today include: Previous EP office notes; previous remotes   Assessment and Plan:  1.  Chronic systolic dysfunction s/p Medtronic CRT-D  EF improved to  55% by echo 04/2019 euvolemic today Stable on an appropriate medical regimen. 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   The patient does not have concerns regarding her medicines.  The following changes were made today:  none  Labs/ tests ordered today include:  No orders of the defined types were placed in this encounter.  Disposition:   Follow up with Dr. Elberta Fortis 6 months   Signed, Graciella Freer, PA-C  08/19/2019 11:15 AM  [facility] [address] [place] [address] [phone] (office) [phone] (fax)

## 2019-08-19 NOTE — Patient Instructions (Addendum)
Medication Instructions:  *If you need a refill on your cardiac medications before your next appointment, please call your pharmacy*  Lab Work: If you have labs (blood work) drawn today and your tests are completely normal, you will receive your results only by: Marland Kitchen MyChart Message (if you have MyChart) OR . A paper copy in the mail If you have any lab test that is abnormal or we need to change your treatment, we will call you to review the results.  Follow-Up: At [facility], you and your health needs are our priority.  As part of our continuing mission to provide you with exceptional heart care, we have created designated Provider Care Teams.  These Care Teams include your primary Cardiologist (physician) and Advanced Practice Providers (APPs -  Physician Assistants and Nurse Practitioners) who all work together to provide you with the care you need, when you need it.  We recommend signing up for the patient portal called "MyChart".  Sign up information is provided on this After Visit Summary.  MyChart is used to connect with patients for Virtual Visits (Telemedicine).  Patients are able to view lab/test results, encounter notes, upcoming appointments, etc.  Non-urgent messages can be sent to your provider as well.   To learn more about what you can do with MyChart, go to [email or web address].    Your next appointment:   Your physician wants you to follow-up in: 6 MONTHS with Dr. Elberta Fortis. You will receive a reminder letter in the mail two months in advance. If you don't receive a letter, please call our office to schedule the follow-up appointment.  Remote monitoring is used to monitor your ICD from home. This monitoring reduces the number of office visits required to check your device to one time per year. It allows [place] to keep an eye on the functioning of your device to ensure it is working properly. You are scheduled for a device check from home on 11/13/19. You may send your transmission at  any time that day. If you have a wireless device, the transmission will be sent automatically. After your physician reviews your transmission, you will receive a postcard with your next transmission date.  The format for your next appointment:   In Person with Loman Brooklyn, MD

## 2019-09-07 ENCOUNTER — Other Ambulatory Visit (HOSPITAL_COMMUNITY): Payer: BC Managed Care – PPO

## 2019-09-10 ENCOUNTER — Other Ambulatory Visit: Payer: Self-pay

## 2019-09-10 ENCOUNTER — Ambulatory Visit (HOSPITAL_BASED_OUTPATIENT_CLINIC_OR_DEPARTMENT_OTHER): Payer: BC Managed Care – PPO | Attending: Internal Medicine | Admitting: Cardiology

## 2019-09-10 DIAGNOSIS — I5022 Chronic systolic (congestive) heart failure: Secondary | ICD-10-CM

## 2019-09-10 DIAGNOSIS — R0683 Snoring: Secondary | ICD-10-CM

## 2019-09-11 NOTE — Procedures (Signed)
    Patient Name: Beverly Andrews, Beverly Andrews Date: 09/10/2019 Gender: Female D.O.B: Aug 11, 1955 Age (years): 64 Referring Provider: Bevelyn Buckles [facility] Height (inches): 67 Interpreting Physician: Armanda Magic MD, ABSM Weight (lbs): 169 RPSGT: Shelah Lewandowsky BMI: 26 MRN: [id number] Neck Size: 15.00  CLINICAL INFORMATION Sleep Study Type: NPSG  Indication for sleep study: Congestive Heart Failure, Fatigue, Hypertension  Epworth Sleepiness Score: 5  SLEEP STUDY TECHNIQUE As per the AASM Manual for the Scoring of Sleep and Associated Events v2.3 (April 2016) with a hypopnea requiring 4% desaturations.  The channels recorded and monitored were frontal, central and occipital EEG, electrooculogram (EOG), submentalis EMG (chin), nasal and oral airflow, thoracic and abdominal wall motion, anterior tibialis EMG, snore microphone, electrocardiogram, and pulse oximetry.  MEDICATIONS Medications self-administered by patient taken the night of the study : N/A  SLEEP ARCHITECTURE The study was initiated at 10:20:37 PM and ended at 4:46:43 AM.  Sleep onset time was 36.8 minutes and the sleep efficiency was 54.3%%. The total sleep time was 209.5 minutes.  Stage REM latency was 168.0 minutes.  The patient spent 17.7% of the night in stage N1 sleep, 74.9% in stage N2 sleep, 0.0% in stage N3 and 7.4% in REM.  Alpha intrusion was absent.  Supine sleep was 79.22%.  RESPIRATORY PARAMETERS The overall apnea/hypopnea index (AHI) was 1.1 per hour. There were 0 total apneas, including 0 obstructive, 0 central and 0 mixed apneas. There were 4 hypopneas and 59 RERAs.  The AHI during Stage REM sleep was 15.5 per hour.  AHI while supine was 1.4 per hour.  The mean oxygen saturation was 93.9%. The minimum SpO2 during sleep was 90.0%.  moderate snoring was noted during this study.  CARDIAC DATA The 2 lead EKG demonstrated sinus rhythm, pacemaker generated. The mean heart rate was 74.7 beats per  minute. Other EKG findings include: none  LEG MOVEMENT DATA The total PLMS were 0 with a resulting PLMS index of 0.0. Associated arousal with leg movement index was 0.6 .  IMPRESSIONS - No significant obstructive sleep apnea occurred during this study (AHI = 1.1/h). - No significant central sleep apnea occurred during this study (CAI = 0.0/h). - The patient had minimal or no oxygen desaturation during the study (Min O2 = 90.0%) - The patient snored with moderate snoring volume. - Clinically significant periodic limb movements did not occur during sleep. No significant associated arousals.  DIAGNOSIS - Nondiagnostic study due to inadequate sleep time.   RECOMMENDATIONS - Avoid alcohol, sedatives and other CNS depressants that may worsen sleep apnea and disrupt normal sleep architecture. - Sleep hygiene should be reviewed to assess factors that may improve sleep quality. - Weight management and regular exercise should be initiated or continued if appropriate. - Recommend home sleep study for further evaluation due to lack of sleep time with in lab study.   [Electronically signed] 09/11/2019 04:01 PM  Armanda Magic MD, ABSM Diplomate, American Board of Sleep Medicine

## 2019-09-16 ENCOUNTER — Telehealth: Payer: Self-pay | Admitting: *Deleted

## 2019-09-16 NOTE — Telephone Encounter (Signed)
-----   Message from Quintella Reichert, MD sent at 09/11/2019  4:03 PM EDT ----- Nondiagnostic study due to inadequate sleep time.  Please order home sleep study

## 2019-09-16 NOTE — Telephone Encounter (Signed)
Informed patient of sleep study results and patient understanding was verbalized. Patient understands her sleep study showed Nondiagnostic study due to inadequate sleep time. Please order home sleep study. Pt is aware and agreeable to her results but says make sure insurance will pay before scheduling.  HST sent to sleep pool.

## 2019-09-24 NOTE — Telephone Encounter (Addendum)
Will submit for a itamar sleep study. Stop Bang sent electronically with notes and sleep study.

## 2019-09-24 NOTE — Telephone Encounter (Signed)
RE: precert Gaynelle Cage, CMA  Reesa Chew, CMA This looks like it was supposed to be a itimar home sleep test. There is a order in there from [facility].     ----- Message -----  From: Reesa Chew, CMA  Sent: 09/16/2019 10:27 AM EDT  To: Cv Div Sleep Studies  Subject: precert                      Home Sleep Study

## 2019-09-25 ENCOUNTER — Other Ambulatory Visit (HOSPITAL_COMMUNITY): Payer: Self-pay | Admitting: Internal Medicine

## 2019-09-25 NOTE — Telephone Encounter (Signed)
Quintella Reichert, MD  Reesa Chew, CMA Itamar is fine

## 2019-10-22 ENCOUNTER — Other Ambulatory Visit (HOSPITAL_COMMUNITY): Payer: Self-pay | Admitting: Internal Medicine

## 2019-12-02 ENCOUNTER — Other Ambulatory Visit: Payer: Self-pay | Admitting: Cardiology

## 2019-12-04 ENCOUNTER — Telehealth: Payer: Self-pay

## 2019-12-04 MED ORDER — SACUBITRIL-VALSARTAN 24-26 MG PO TABS: 1.0000 | ORAL_TABLET | Freq: Two times a day (BID) | ORAL | 2 refills | Status: DC

## 2019-12-04 NOTE — Telephone Encounter (Signed)
**Note De-Identified Griffen Frayne Obfuscation** [place] PA started through covermymeds: Key: [id number]

## 2019-12-09 NOTE — Telephone Encounter (Signed)
**Note De-Identified Beverly Andrews Obfuscation** Letter received Beverly Andrews fax from [facility] stating that they have approved the pts [place] PA. Approval is valid until 12/04/2019  I have notified [facility] pharmacy of this approval as well.

## 2020-03-10 ENCOUNTER — Other Ambulatory Visit: Payer: Self-pay | Admitting: Cardiology

## 2020-03-11 MED ORDER — SACUBITRIL-VALSARTAN 24-26 MG PO TABS: 1.0000 | ORAL_TABLET | Freq: Two times a day (BID) | ORAL | 1 refills | Status: DC

## 2020-04-10 ENCOUNTER — Other Ambulatory Visit (HOSPITAL_COMMUNITY): Payer: Self-pay | Admitting: Internal Medicine

## 2020-05-26 ENCOUNTER — Other Ambulatory Visit (HOSPITAL_COMMUNITY): Payer: Self-pay | Admitting: Internal Medicine

## 2020-06-19 ENCOUNTER — Encounter: Payer: Self-pay | Admitting: Cardiology

## 2020-07-13 ENCOUNTER — Other Ambulatory Visit: Payer: Self-pay

## 2020-07-13 MED ORDER — POTASSIUM CHLORIDE CRYS ER 20 MEQ PO TBCR: 20.0000 meq | EXTENDED_RELEASE_TABLET | Freq: Every day | ORAL | 0 refills | Status: DC

## 2020-07-18 ENCOUNTER — Other Ambulatory Visit (HOSPITAL_COMMUNITY): Payer: Self-pay | Admitting: Internal Medicine

## 2020-08-11 ENCOUNTER — Encounter: Payer: Self-pay | Admitting: Cardiology

## 2020-08-31 ENCOUNTER — Telehealth (HOSPITAL_COMMUNITY): Payer: Self-pay | Admitting: Internal Medicine

## 2020-08-31 ENCOUNTER — Other Ambulatory Visit (HOSPITAL_COMMUNITY): Payer: Self-pay | Admitting: Internal Medicine

## 2020-10-15 ENCOUNTER — Other Ambulatory Visit: Payer: Self-pay | Admitting: Cardiology

## 2020-11-04 ENCOUNTER — Other Ambulatory Visit: Payer: Self-pay | Admitting: Cardiology

## 2020-11-04 ENCOUNTER — Other Ambulatory Visit (HOSPITAL_COMMUNITY): Payer: Self-pay | Admitting: Internal Medicine

## 2020-11-10 ENCOUNTER — Observation Stay (HOSPITAL_COMMUNITY)
Admission: EM | Admit: 2020-11-10 | Discharge: 2020-11-14 | Disposition: A | Discharge status: Home / Self Care | Payer: BC Managed Care – PPO | Attending: Emergency Medicine | Admitting: Emergency Medicine

## 2020-11-10 ENCOUNTER — Other Ambulatory Visit: Payer: Self-pay

## 2020-11-10 ENCOUNTER — Emergency Department (HOSPITAL_COMMUNITY): Payer: BC Managed Care – PPO

## 2020-11-10 DIAGNOSIS — Y92031 Bathroom in apartment as the place of occurrence of the external cause: Secondary | ICD-10-CM | POA: Diagnosis not present

## 2020-11-10 DIAGNOSIS — S3289XA Fracture of other parts of pelvis, initial encounter for closed fracture: Secondary | ICD-10-CM | POA: Diagnosis not present

## 2020-11-10 DIAGNOSIS — Z79899 Other long term (current) drug therapy: Secondary | ICD-10-CM | POA: Diagnosis not present

## 2020-11-10 DIAGNOSIS — Z87891 Personal history of nicotine dependence: Secondary | ICD-10-CM | POA: Diagnosis not present

## 2020-11-10 DIAGNOSIS — W1830XA Fall on same level, unspecified, initial encounter: Secondary | ICD-10-CM | POA: Insufficient documentation

## 2020-11-10 DIAGNOSIS — I428 Other cardiomyopathies: Secondary | ICD-10-CM

## 2020-11-10 DIAGNOSIS — D696 Thrombocytopenia, unspecified: Secondary | ICD-10-CM | POA: Insufficient documentation

## 2020-11-10 DIAGNOSIS — E871 Hypo-osmolality and hyponatremia: Secondary | ICD-10-CM | POA: Diagnosis not present

## 2020-11-10 DIAGNOSIS — Z20822 Contact with and (suspected) exposure to covid-19: Secondary | ICD-10-CM | POA: Insufficient documentation

## 2020-11-10 DIAGNOSIS — I110 Hypertensive heart disease with heart failure: Secondary | ICD-10-CM | POA: Diagnosis not present

## 2020-11-10 DIAGNOSIS — M25552 Pain in left hip: Secondary | ICD-10-CM | POA: Diagnosis present

## 2020-11-10 DIAGNOSIS — R55 Syncope and collapse: Secondary | ICD-10-CM | POA: Diagnosis not present

## 2020-11-10 DIAGNOSIS — S329XXA Fracture of unspecified parts of lumbosacral spine and pelvis, initial encounter for closed fracture: Secondary | ICD-10-CM | POA: Diagnosis present

## 2020-11-10 DIAGNOSIS — W19XXXA Unspecified fall, initial encounter: Secondary | ICD-10-CM

## 2020-11-10 DIAGNOSIS — I5022 Chronic systolic (congestive) heart failure: Secondary | ICD-10-CM | POA: Insufficient documentation

## 2020-11-10 DIAGNOSIS — Y92009 Unspecified place in unspecified non-institutional (private) residence as the place of occurrence of the external cause: Secondary | ICD-10-CM

## 2020-11-10 LAB — CBC WITH DIFFERENTIAL/PLATELET
Abs Immature Granulocytes: 0.05 10*3/uL (ref 0.00–0.07)
Basophils Absolute: 0 10*3/uL (ref 0.0–0.1)
Basophils Relative: 0 %
Eosinophils Absolute: 0 10*3/uL (ref 0.0–0.5)
Eosinophils Relative: 0 %
HCT: 33.7 % — ABNORMAL LOW (ref 36.0–46.0)
Hemoglobin: 12.5 g/dL (ref 12.0–15.0)
Immature Granulocytes: 1 %
Lymphocytes Relative: 7 %
Lymphs Abs: 0.7 10*3/uL (ref 0.7–4.0)
MCH: 42.5 pg — ABNORMAL HIGH (ref 26.0–34.0)
MCHC: 37.1 g/dL — ABNORMAL HIGH (ref 30.0–36.0)
MCV: 114.6 fL — ABNORMAL HIGH (ref 80.0–100.0)
Monocytes Absolute: 1.1 10*3/uL — ABNORMAL HIGH (ref 0.1–1.0)
Monocytes Relative: 11 %
Neutro Abs: 8.2 10*3/uL — ABNORMAL HIGH (ref 1.7–7.7)
Neutrophils Relative %: 81 %
Platelets: 131 10*3/uL — ABNORMAL LOW (ref 150–400)
RBC: 2.94 MIL/uL — ABNORMAL LOW (ref 3.87–5.11)
RDW: 13.2 % (ref 11.5–15.5)
WBC: 10 10*3/uL (ref 4.0–10.5)
nRBC: 0 % (ref 0.0–0.2)

## 2020-11-10 LAB — BASIC METABOLIC PANEL
Anion gap: 10 (ref 5–15)
BUN: 17 mg/dL (ref 8–23)
CO2: 21 mmol/L — ABNORMAL LOW (ref 22–32)
Calcium: 9.4 mg/dL (ref 8.9–10.3)
Chloride: 98 mmol/L (ref 98–111)
Creatinine, Ser: 1.32 mg/dL — ABNORMAL HIGH (ref 0.44–1.00)
GFR, Estimated: 45 mL/min — ABNORMAL LOW (ref >60)
Glucose, Bld: 125 mg/dL — ABNORMAL HIGH (ref 70–99)
Potassium: 4.9 mmol/L (ref 3.5–5.1)
Sodium: 129 mmol/L — ABNORMAL LOW (ref 135–145)

## 2020-11-10 MED ORDER — OXYCODONE-ACETAMINOPHEN 5-325 MG PO TABS
1.0000 | ORAL_TABLET | Freq: Once | ORAL | Status: AC
  Administered 2020-11-10: 1 via ORAL
  Filled 2020-11-10: qty 1

## 2020-11-10 NOTE — ED Provider Notes (Signed)
Emergency Medicine Provider Triage Evaluation Note  Beverly Andrews , a 65 y.o. female  was evaluated in triage.  Pt complains of left hip pain after 2 syncopal episodes.  Patient was trying to get up to go to the bathroom when she fell onto her left side.  When she tried to get up she felt dizzy and fell again onto her right side.  No history of seizure, syncope or arrhythmia.  Review of Systems  Positive: Left hip pain Negative: Dizziness  Physical Exam  BP 105/72   Pulse 80   Temp 98.9 F (37.2 C) (Oral)   Resp 16   Ht 5\' 7"  (1.702 m)   Wt 72.1 kg   SpO2 99%   BMI 24.90 kg/m  Gen:   Awake, no distress   Resp:  Normal effort  MSK:   Moves extremities without difficulty  Other:  Severe pain to palpation of the lateral left hip.  No obvious deformity.  Medical Decision Making  Medically screening exam initiated at 6:55 PM.  Appropriate orders placed.  Beverly Andrews was informed that the remainder of the evaluation will be completed by another provider, this initial triage assessment does not replace that evaluation, and the importance of remaining in the ED until their evaluation is complete.     Fulton Reek 11/10/20 [id number]    01/10/21, MD 11/10/20 [phone]

## 2020-11-10 NOTE — ED Triage Notes (Addendum)
Pt arrived by EMS after fall this morning 5am. States fell occurred due to feeling lightheaded  Pt lives alone was able to back to her bed after initial fall states she also fell a second time trying to get into bed  Pt is alert and oriented, denies hitting her head or having an LOC Pt is on blood thinners  Left hip pain, no obvious injuries. CMS and pulse intact   EMS gave of NS and 2x doses of Fentanyl last dose at [facility]

## 2020-11-11 ENCOUNTER — Emergency Department (HOSPITAL_COMMUNITY): Payer: BC Managed Care – PPO

## 2020-11-11 DIAGNOSIS — D696 Thrombocytopenia, unspecified: Secondary | ICD-10-CM

## 2020-11-11 DIAGNOSIS — W19XXXA Unspecified fall, initial encounter: Secondary | ICD-10-CM

## 2020-11-11 DIAGNOSIS — S329XXA Fracture of unspecified parts of lumbosacral spine and pelvis, initial encounter for closed fracture: Secondary | ICD-10-CM | POA: Diagnosis not present

## 2020-11-11 DIAGNOSIS — E871 Hypo-osmolality and hyponatremia: Secondary | ICD-10-CM

## 2020-11-11 LAB — CBC
HCT: 36.3 % (ref 36.0–46.0)
Hemoglobin: 13.1 g/dL (ref 12.0–15.0)
MCH: 43.1 pg — ABNORMAL HIGH (ref 26.0–34.0)
MCHC: 36.1 g/dL — ABNORMAL HIGH (ref 30.0–36.0)
MCV: 119.4 fL — ABNORMAL HIGH (ref 80.0–100.0)
Platelets: 135 10*3/uL — ABNORMAL LOW (ref 150–400)
RBC: 3.04 MIL/uL — ABNORMAL LOW (ref 3.87–5.11)
RDW: 13.7 % (ref 11.5–15.5)
WBC: 8.1 10*3/uL (ref 4.0–10.5)
nRBC: 0 % (ref 0.0–0.2)

## 2020-11-11 LAB — BASIC METABOLIC PANEL
Anion gap: 11 (ref 5–15)
BUN: 16 mg/dL (ref 8–23)
CO2: 18 mmol/L — ABNORMAL LOW (ref 22–32)
Calcium: 9.1 mg/dL (ref 8.9–10.3)
Chloride: 103 mmol/L (ref 98–111)
Creatinine, Ser: 1.17 mg/dL — ABNORMAL HIGH (ref 0.44–1.00)
GFR, Estimated: 52 mL/min — ABNORMAL LOW (ref >60)
Glucose, Bld: 96 mg/dL (ref 70–99)
Potassium: 4.3 mmol/L (ref 3.5–5.1)
Sodium: 132 mmol/L — ABNORMAL LOW (ref 135–145)

## 2020-11-11 LAB — RESP PANEL BY RT-PCR (FLU A&B, COVID) ARPGX2
Influenza A by PCR: NEGATIVE
Influenza B by PCR: NEGATIVE
SARS Coronavirus 2 by RT PCR: NEGATIVE

## 2020-11-11 LAB — HIV ANTIBODY (ROUTINE TESTING W REFLEX): HIV Screen 4th Generation wRfx: NONREACTIVE

## 2020-11-11 MED ORDER — MORPHINE SULFATE (PF) 4 MG/ML IV SOLN
4.0000 mg | Freq: Once | INTRAVENOUS | Status: AC
Start: 2020-11-11 — End: 2020-11-11
  Administered 2020-11-11: 4 mg via INTRAVENOUS
  Filled 2020-11-11: qty 1

## 2020-11-11 MED ORDER — THIAMINE HCL 100 MG/ML IJ SOLN: 100.0000 mg | Freq: Every day | INTRAMUSCULAR | Status: DC

## 2020-11-11 MED ORDER — MORPHINE SULFATE (PF) 2 MG/ML IV SOLN
1.0000 mg | INTRAVENOUS | Status: DC | PRN
Start: 2020-11-11 — End: 2020-11-14
  Administered 2020-11-12 – 2020-11-13 (×2): 1 mg via INTRAVENOUS
  Filled 2020-11-11 (×2): qty 1

## 2020-11-11 MED ORDER — OXYCODONE-ACETAMINOPHEN 5-325 MG PO TABS
1.0000 | ORAL_TABLET | Freq: Four times a day (QID) | ORAL | Status: DC | PRN
  Administered 2020-11-11 – 2020-11-14 (×8): 2 via ORAL
  Filled 2020-11-11 (×8): qty 2

## 2020-11-11 MED ORDER — ACETAMINOPHEN 650 MG RE SUPP: 650.0000 mg | Freq: Four times a day (QID) | RECTAL | Status: DC | PRN

## 2020-11-11 MED ORDER — ADULT MULTIVITAMIN W/MINERALS CH
1.0000 | ORAL_TABLET | Freq: Every day | ORAL | Status: DC
  Administered 2020-11-11 – 2020-11-14 (×4): 1 via ORAL
  Filled 2020-11-11 (×4): qty 1

## 2020-11-11 MED ORDER — HEPARIN SODIUM (PORCINE) 5000 UNIT/ML IJ SOLN
5000.0000 [IU] | Freq: Three times a day (TID) | INTRAMUSCULAR | Status: DC
  Administered 2020-11-11 – 2020-11-12 (×5): 5000 [IU] via SUBCUTANEOUS
  Filled 2020-11-11 (×5): qty 1

## 2020-11-11 MED ORDER — SODIUM CHLORIDE 0.9 % IV BOLUS
1000.0000 mL | Freq: Once | INTRAVENOUS | Status: AC
  Administered 2020-11-11: 1000 mL via INTRAVENOUS

## 2020-11-11 MED ORDER — ONDANSETRON HCL 4 MG/2ML IJ SOLN
4.0000 mg | Freq: Once | INTRAMUSCULAR | Status: AC
  Administered 2020-11-11: 4 mg via INTRAVENOUS
  Filled 2020-11-11: qty 2

## 2020-11-11 MED ORDER — LORAZEPAM 1 MG PO TABS: 1.0000 mg | ORAL_TABLET | ORAL | Status: AC | PRN

## 2020-11-11 MED ORDER — LORAZEPAM 2 MG/ML IJ SOLN
1.0000 mg | INTRAMUSCULAR | Status: AC | PRN
Start: 2020-11-11 — End: 2020-11-14

## 2020-11-11 MED ORDER — MORPHINE SULFATE (PF) 4 MG/ML IV SOLN
4.0000 mg | Freq: Once | INTRAVENOUS | Status: AC
  Administered 2020-11-11: 4 mg via INTRAVENOUS
  Filled 2020-11-11: qty 1

## 2020-11-11 MED ORDER — THIAMINE HCL 100 MG PO TABS
100.0000 mg | ORAL_TABLET | Freq: Every day | ORAL | Status: DC
  Administered 2020-11-11 – 2020-11-14 (×4): 100 mg via ORAL
  Filled 2020-11-11 (×4): qty 1

## 2020-11-11 MED ORDER — LACTATED RINGERS IV SOLN: INTRAVENOUS | Status: DC

## 2020-11-11 MED ORDER — FOLIC ACID 1 MG PO TABS
1.0000 mg | ORAL_TABLET | Freq: Every day | ORAL | Status: DC
  Administered 2020-11-11 – 2020-11-14 (×4): 1 mg via ORAL
  Filled 2020-11-11 (×4): qty 1

## 2020-11-11 MED ORDER — LACTATED RINGERS IV SOLN: INTRAVENOUS | Status: AC

## 2020-11-11 MED ORDER — ACETAMINOPHEN 325 MG PO TABS: 650.0000 mg | ORAL_TABLET | Freq: Four times a day (QID) | ORAL | Status: DC | PRN

## 2020-11-11 MED ORDER — ALLOPURINOL 100 MG PO TABS
100.0000 mg | ORAL_TABLET | Freq: Every day | ORAL | Status: DC
  Administered 2020-11-11 – 2020-11-14 (×4): 100 mg via ORAL
  Filled 2020-11-11 (×4): qty 1

## 2020-11-11 NOTE — Progress Notes (Signed)
Occupational Therapy evaluation completed - full write up to follow.  Anticipate she will require SNF level rehab at discharge.   Eber Jones., OTR/L Acute Rehabilitation Services Pager [phone] Office [phone]

## 2020-11-11 NOTE — ED Notes (Signed)
Pt in radiology at this time. 

## 2020-11-11 NOTE — ED Notes (Signed)
Admit provider at bedside 

## 2020-11-11 NOTE — ED Notes (Signed)
Lunch ordered 

## 2020-11-11 NOTE — ED Provider Notes (Signed)
[facility] EMERGENCY DEPARTMENT Provider Note   CSN: [id number] Arrival date & time: 11/10/20  1745     History Chief Complaint  Patient presents with   Marletta Lor    Beverly Andrews is a 65 y.o. female hx of HTN, nonischemic cardiomyopathy, here presenting with fall.  Patient states she was in the bathroom and felt lightheaded and dizzy and fell twice.  Initially she fell on the right hip and then she stood up and felt dizzy and fell on the left hip.  She states that she was unable to walk after the second fall.  Denies actual passing out.  She is unclear if she hit her head or not  The history is provided by the patient.      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   Chronic systolic (congestive) heart failure (HCC) 11/02/2018   Non-ischemic cardiomyopathy (HCC) 10/03/2017   Normal coronary arteries 10/03/2017   Acute systolic heart failure (HCC)    Left bundle branch block 09/19/2017   DOE (dyspnea on exertion) 09/19/2017   Hypertension 09/19/2017   Alcohol abuse 09/19/2017    Past Surgical History:  Procedure Laterality Date   BIV ICD INSERTION CRT-D N/A 11/02/2018   Procedure: BIV ICD INSERTION CRT-D;  Surgeon: Regan Lemming, MD;  Location: [facility] INVASIVE CV LAB;  Service: Cardiovascular;  Laterality: N/A;   KNEE ARTHROSCOPY     RIGHT/LEFT HEART CATH AND CORONARY ANGIOGRAPHY N/A 09/20/2017   Procedure: RIGHT/LEFT HEART CATH AND CORONARY ANGIOGRAPHY;  Surgeon: Iran Ouch, MD;  Location: [facility] INVASIVE CV LAB;  Service: Cardiovascular;  Laterality: N/A;   TONSILLECTOMY       OB History   No  obstetric history on file.     Family History  Problem Relation Age of Onset   AAA (abdominal aortic aneurysm) Father    Aortic stenosis Father    Prostate cancer Father     Social History   Tobacco Use   Smoking status: Former   Smokeless tobacco: Never   Tobacco comments:    quit 6 months ago  Vaping Use   Vaping Use: Never used  Substance Use Topics   Alcohol use: Yes    Alcohol/week: 2.0 standard drinks    Types: 2 Standard drinks or equivalent per week   Drug use: Never    Home Medications Prior to Admission medications   Medication Sig Start Date End Date Taking? Authorizing Provider  acetaminophen (TYLENOL) 500 MG tablet Take 1,000 mg by mouth every 6 (six) hours as needed for moderate pain or headache.    [provider]  albuterol (PROVENTIL HFA;VENTOLIN HFA) 108 (90 Base) MCG/ACT inhaler Inhale 1-2 puffs into the lungs every 6 (six) hours as needed for wheezing or shortness of breath.    [provider]  Ascorbic Acid (VITAMIN C PO) Take 1 tablet by mouth daily.    [provider]  BIOTIN PO Take 500 mcg by mouth daily.    [provider]  carvedilol (COREG) 3.125 MG tablet TAKE 1 TABLET BY MOUTH TWICE DAILY WITH A MEAL 10/23/19  Bensimhon, Bevelyn Buckles, MD  Cholecalciferol (VITAMIN D) 50 MCG (2000 UT) tablet Take 2,000 Units by mouth daily.    [provider]  colchicine 0.6 MG tablet Take 0.6 mg by mouth daily as needed (gout flare up).    [provider]  COLLAGEN PO Take 1 tablet by mouth daily.    [provider]  diphenhydrAMINE (BENADRYL) 25 MG tablet Take 75 mg by mouth at bedtime as needed for sleep.     [provider]  furosemide (LASIX) 20 MG tablet Take 20 mg by mouth daily as needed for fluid or edema (TAKE  AN  ADDITIONAL  TABLET  IF  WEIGHT  INCREASES  BY  2  TO  3  POUNDS.).    [provider]  Multiple Vitamin (MULTIVITAMIN WITH MINERALS) TABS tablet Take 1 tablet by mouth  daily. 09/26/17   Berton Bon, NP  potassium chloride SA (KLOR-CON) 20 MEQ tablet Take 1 tablet (20 mEq total) by mouth daily. Please make overdue appt for future refills Thank you 2nd attempt 10/16/20   Regan Lemming, MD  sacubitril-valsartan (ENTRESTO) 24-26 MG Take 1 tablet by mouth 2 (two) times daily. 03/11/20   Camnitz, Andree Coss, MD  spironolactone (ALDACTONE) 25 MG tablet Take 1 tablet (25 mg total) by mouth daily. Absolute last refill without office visit-please call [phone] 11/04/20   Bensimhon, Bevelyn Buckles, MD  vitamin B-12 (CYANOCOBALAMIN) 1000 MCG tablet Take 1,000 mcg by mouth daily.    [provider]    Allergies    Amoxicillin, Carbocaine [mepivacaine], Penicillins, and Shellfish allergy  Review of Systems   Review of Systems  Musculoskeletal:        L hip pain   All other systems reviewed and are negative.  Physical Exam Updated Vital Signs BP 108/71 (BP Location: Right Arm)   Pulse 77   Temp 98.9 F (37.2 C) (Oral)   Resp 16   Ht 5\' 7"  (1.702 m)   Wt 72.1 kg   SpO2 98%   BMI 24.90 kg/m   Physical Exam Vitals and nursing note reviewed.  Constitutional:      Comments: Uncomfortable  HENT:     Head: Normocephalic.     Comments: Small posterior scalp hematoma    Mouth/Throat:     Mouth: Mucous membranes are moist.  Eyes:     Extraocular Movements: Extraocular movements intact.     Pupils: Pupils are equal, round, and reactive to light.  Neck:     Comments: No obvious midline tenderness Cardiovascular:     Rate and Rhythm: Normal rate and regular rhythm.     Pulses: Normal pulses.     Heart sounds: Normal heart sounds.  Pulmonary:     Effort: Pulmonary effort is normal.     Breath sounds: Normal breath sounds.  Abdominal:     General: Abdomen is flat.     Palpations: Abdomen is soft.  Musculoskeletal:     Cervical back: Normal range of motion.     Comments: Tenderness to the left pelvis and hip area.  Unable to range the left hip.   Patient is able to range the right hip.  Both feet are cold and has diminished pulses bilaterally  Skin:    General: Skin is warm.     Capillary Refill: Capillary refill takes less than 2 seconds.  Neurological:     General: No focal deficit present.     Mental Status: She is oriented to person, place, and  time.  Psychiatric:        Mood and Affect: Mood normal.        Behavior: Behavior normal.    ED Results / Procedures / Treatments   Labs (all labs ordered are listed, but only abnormal results are displayed) Labs Reviewed  BASIC METABOLIC PANEL - Abnormal; Notable for the following components:      Result Value   Sodium 129 (*)    CO2 21 (*)    Glucose, Bld 125 (*)    Creatinine, Ser 1.32 (*)    GFR, Estimated 45 (*)    All other components within normal limits  CBC WITH DIFFERENTIAL/PLATELET - Abnormal; Notable for the following components:   RBC 2.94 (*)    HCT 33.7 (*)    MCV 114.6 (*)    MCH 42.5 (*)    MCHC 37.1 (*)    Platelets 131 (*)    Neutro Abs 8.2 (*)    Monocytes Absolute 1.1 (*)    All other components within normal limits  RESP PANEL BY RT-PCR (FLU A&B, COVID) [id number]    EKG EKG Interpretation  Date/Time:  Tuesday November 10 2020 18:52:04 EDT Ventricular Rate:  79 PR Interval:  150 QRS Duration: 118 QT Interval:  404 QTC Calculation: 463 R Axis:   82 Text Interpretation: Atrial-sensed ventricular-paced rhythm Abnormal ECG No significant change since last tracing Confirmed by Richardean Canal [phone]) on 11/11/2020 2:05:34 AM  Radiology DG Hip Unilat W or Wo Pelvis 2-3 Views Left  Result Date: 11/10/2020 CLINICAL DATA:  Hip pain.  Status post fall. EXAM: DG HIP (WITH OR WITHOUT PELVIS) 2-3V LEFT COMPARISON:  None. FINDINGS: Both hips are located. No evidence for hip fracture or dislocation. Acute fractures involve the superior and inferior left pubic rami. Nondisplaced. Right hip appears intact and located. IMPRESSION: Acute fractures of the left  superior and inferior pubic rami. Electronically Signed   By: Signa Kell M.D.   On: 11/10/2020 19:59    Procedures Procedures   Medications Ordered in ED Medications  morphine 4 MG/ML injection 4 mg (has no administration in time range)  ondansetron (ZOFRAN) injection 4 mg (has no administration in time range)  sodium chloride 0.9 % bolus 1,000 mL (has no administration in time range)  oxyCODONE-acetaminophen (PERCOCET/ROXICET) 5-325 MG per tablet 1 tablet (1 tablet Oral Given 11/10/20 2148)    ED Course  I have reviewed the triage vital signs and the nursing notes.  Pertinent labs & imaging results that were available during my care of the patient were reviewed by me and considered in my medical decision making (see chart for details).    MDM Rules/Calculators/A&P                           Beverly Andrews is a 65 y.o. female here with fall.  She has left hip pain and unable to walk.  Consider pelvic versus hip fracture.  We will get labs and CT head and neck and hip x-rays.  If hip x-ray did not show a fracture, will need a CT pelvis  3:58 AM Labs showed chronic hyponatremia sodium 129.  X-ray showed pelvic fracture and CT did not show any occult hip fracture.  Patient is still in severe pain despite pain medicines.  Patient will need admission for physical therapy.  I left a secure chat message with Dr. Jena Gauss and hospitalist to admit    Final Clinical Impression(s) / ED  Diagnoses Final diagnoses:  None    Rx / DC Orders ED Discharge Orders     None        Charlynne Pander, MD 11/11/20 0400

## 2020-11-11 NOTE — Social Work (Addendum)
Pt still in ED waiting for bed assignment. CSW completed TOC initial assessment/PASRR/FL2.  Referrals sent to area SNFs.   No Auth started.

## 2020-11-11 NOTE — Evaluation (Signed)
Occupational Therapy Evaluation Patient Details Name: Beverly Andrews MRN: [id number] DOB: 1955/04/15 Today's Date: 11/11/2020   History of Present Illness 65 y.o. female admitted on 11/10/20 for fall, presyncope, and resultant L superior/infereior pubic rami pelvic fxs.  WBAT per ortho.  Pt with significant PMH of gout, HTN, non-ischemic cardiomyopathy, BIV ICD, knee arthroscopy, ETOH abuse.   Clinical Impression   Pt admitted with above. She demonstrates the below listed deficits and will benefit from continued OT to maximize safety and independence with BADLs.  Pt presents to OT with significant pain, decreased activity tolerance, generalized weakness.  She requires mod - max A for UB ADLs and total A for LB ADLs.  She was unable to move to EOB due to pain.  PTA, pt lived alone, and reports she was mod I, but was struggling with fatigue.  Recommend SNF level rehab at discharge.        Recommendations for follow up therapy are one component of a multi-disciplinary discharge planning process, led by the attending physician.  Recommendations may be updated based on patient status, additional functional criteria and insurance authorization.   Follow Up Recommendations  SNF    Equipment Recommendations  None recommended by OT    Recommendations for Other Services       Precautions / Restrictions Precautions Precautions: Fall Restrictions Weight Bearing Restrictions: No RLE Weight Bearing: Weight bearing as tolerated LLE Weight Bearing: Weight bearing as tolerated      Mobility Bed Mobility Overal bed mobility: Needs Assistance             General bed mobility comments: Pt unable to lift HOB >30 degrees due to pain at this time, shaking in pain.  RN brining pain meds at the end of the session.  Will need significant premedication.    Transfers                      Balance                                           ADL either performed or  assessed with clinical judgement   ADL Overall ADL's : Needs assistance/impaired Eating/Feeding: Set up;Bed level   Grooming: Wash/dry hands;Wash/dry face;Oral care;Brushing hair;Moderate assistance;Bed level   Upper Body Bathing: Moderate assistance;Bed level   Lower Body Bathing: Total assistance;Bed level   Upper Body Dressing : Total assistance;Bed level   Lower Body Dressing: Total assistance;Bed level   Toilet Transfer: Total assistance Toilet Transfer Details (indicate cue type and reason): unable Toileting- Clothing Manipulation and Hygiene: Total assistance;Bed level       Functional mobility during ADLs: Total assistance       Vision Baseline Vision/History: 1 Wears glasses Patient Visual Report: No change from baseline       Perception     Praxis      Pertinent Vitals/Pain Pain Location: left hip and groin Pain Descriptors / Indicators: Aching;Burning     Hand Dominance Right   Extremity/Trunk Assessment Upper Extremity Assessment Upper Extremity Assessment: Generalized weakness (UE movement causes significant pelvic pain.  bil. UEs tremulous)   Lower Extremity Assessment RLE Deficits / Details: attempted to test R LE with difficulty due to pain in left pelvis with any R LE movement.  Ankle 3-/5, knee 2-/5, hip NT due to pain in left pelvis especially with eccentricl lowering of right leg back  to bed after doing a limited heel slide. LLE: Unable to fully assess due to pain   Cervical / Trunk Assessment Cervical / Trunk Assessment: Normal   Communication Communication Communication: No difficulties   Cognition Arousal/Alertness: Awake/alert Behavior During Therapy: WFL for tasks assessed/performed;Anxious Overall Cognitive Status: Within Functional Limits for tasks assessed                                     General Comments  HR into 120s with attempts to lift her arms    Exercises     Shoulder Instructions      Home  Living Family/patient expects to be discharged to:: Private residence Living Arrangements: Alone Available Help at Discharge: Family (sister lives in town, mom lives in [place]) Type of Home: House Home Access: Stairs to enter Secretary/administrator of Steps: 1 (curb) Entrance Stairs-Rails: None Home Layout: One level     Bathroom Shower/Tub: Tub/shower unit (doesn't have hot water right now.)   Firefighter: Standard     Home Equipment: Cane - single point   Additional Comments: "I do nothing" re-hobbies, etc since retirement, had a bad gout flare all summer.      Prior Functioning/Environment Level of Independence: Independent        Comments: retired end of May, drives, does everything independently        OT Problem List: Decreased strength;Decreased activity tolerance;Impaired balance (sitting and/or standing);Decreased safety awareness;Decreased knowledge of use of DME or AE;Decreased knowledge of precautions;Pain      OT Treatment/Interventions: Self-care/ADL training;Therapeutic exercise;DME and/or AE instruction;Therapeutic activities;Patient/family education;Balance training    OT Goals(Current goals can be found in the care plan section) Acute Rehab OT Goals Patient Stated Goal: to decrease pain, go to rehab OT Goal Formulation: With patient Time For Goal Achievement: 11/25/20 Potential to Achieve Goals: Good ADL Goals Pt Will Perform Grooming: with set-up;sitting Pt Will Perform Upper Body Bathing: with set-up;sitting Pt Will Perform Lower Body Bathing: with mod assist;with adaptive equipment;sit to/from stand Pt Will Transfer to Toilet: with mod assist;stand pivot transfer;bedside commode Pt Will Perform Toileting - Clothing Manipulation and hygiene: with mod assist;sit to/from stand  OT Frequency: Min 2X/week   Barriers to D/C: Decreased caregiver support          Co-evaluation PT/OT/SLP Co-Evaluation/Treatment: Yes Reason for Co-Treatment:  For patient/therapist safety;To address functional/ADL transfers   OT goals addressed during session: ADL's and self-care      AM-PAC OT "6 Clicks" Daily Activity     Outcome Measure Help from another person eating meals?: A Little Help from another person taking care of personal grooming?: A Lot Help from another person toileting, which includes using toliet, bedpan, or urinal?: Total Help from another person bathing (including washing, rinsing, drying)?: A Lot Help from another person to put on and taking off regular upper body clothing?: Total Help from another person to put on and taking off regular lower body clothing?: Total 6 Click Score: 10   End of Session Nurse Communication: Mobility status  Activity Tolerance: Patient limited by pain Patient left: in bed;with call bell/phone within reach;with family/visitor present  OT Visit Diagnosis: Pain Pain - part of body:  (pelvis)                Time: 1341-1355 OT Time Calculation (min): 14 min Charges:  OT General Charges $OT Visit: 1 Visit OT Evaluation $OT Eval Moderate Complexity:  1 Mod  Eber Jones., OTR/L Acute Rehabilitation Services Pager [phone] Office [phone]   Jeani Hawking M 11/11/2020, 5:34 PM

## 2020-11-11 NOTE — Consult Note (Signed)
Reason for Consult:Pelvic fxs Referring Physician: Dow Adolph Time called: 8101 Time at bedside: 0907   Beverly Andrews is an 65 y.o. female.  HPI: Beverly Andrews was feeling nauseated early Tuesday morning. While in the bathroom she became presyncopal and fell while trying to get off the toilet. She got herself up but then fell again by the sink. The second time she had significant pelvic pain. Her sister came to help but could barely get her up. She was brought to the ED where x-rays showed left sup/inf rami fxs and orthopedic surgery was consulted. She lives at home alone and does not use any assistive devices to ambulate.  Past Medical History:  Diagnosis Date   Alcohol abuse    Gout    History of cardiac cath 09/24/2017   09/24/17: normal coronary arteries, EF <25%   Hypertension    Non-ischemic cardiomyopathy (HCC) 09/18/2017   09/18/17- EF 20-25%   Recurrent pneumonia     Past Surgical History:  Procedure Laterality Date   BIV ICD INSERTION CRT-D N/A 11/02/2018   Procedure: BIV ICD INSERTION CRT-D;  Surgeon: Regan Lemming, MD;  Location: [facility] INVASIVE CV LAB;  Service: Cardiovascular;  Laterality: N/A;   KNEE ARTHROSCOPY     RIGHT/LEFT HEART CATH AND CORONARY ANGIOGRAPHY N/A 09/20/2017   Procedure: RIGHT/LEFT HEART CATH AND CORONARY ANGIOGRAPHY;  Surgeon: Iran Ouch, MD;  Location: [facility] INVASIVE CV LAB;  Service: Cardiovascular;  Laterality: N/A;   TONSILLECTOMY      Family History  Problem Relation Age of Onset   AAA (abdominal aortic aneurysm) Father    Aortic stenosis Father    Prostate cancer Father     Social History:  reports that she has quit smoking. She has never used smokeless tobacco. She reports current alcohol use of about 2.0 standard drinks per week. She reports that she does not use drugs.  Allergies:  Allergies  Allergen Reactions   Amoxicillin Itching and Swelling   Carbocaine [Mepivacaine] Hives and Swelling   Penicillins Itching and Swelling     Did it involve swelling of the face/tongue/throat, SOB, or low BP? Yes Did it involve sudden or severe rash/hives, skin peeling, or any reaction on the inside of your mouth or nose? Yes Did you need to seek medical attention at a hospital or doctor's office? No When did it last happen?      10 + years If all above answers are "NO", may proceed with cephalosporin use.     Shellfish Allergy Swelling    Medications: I have reviewed the patient's current medications.  Results for orders placed or performed during the hospital encounter of 11/10/20 (from the past 48 hour(s))  Basic metabolic panel     Status: Abnormal   Collection Time: 11/10/20  6:58 PM  Result Value Ref Range   Sodium 129 (L) 135 - 145 mmol/L   Potassium 4.9 3.5 - 5.1 mmol/L   Chloride 98 98 - 111 mmol/L   CO2 21 (L) 22 - 32 mmol/L   Glucose, Bld 125 (H) 70 - 99 mg/dL    Comment: Glucose reference range applies only to samples taken after fasting for at least 8 hours.   BUN 17 8 - 23 mg/dL   Creatinine, Ser 7.51 (H) 0.44 - 1.00 mg/dL   Calcium 9.4 8.9 - 02.5 mg/dL   GFR, Estimated 45 (L) >60 mL/min    Comment: (NOTE) Calculated using the CKD-EPI Creatinine Equation (2021)    Anion gap 10 5 - 15  Comment: Performed at [facility], [address], [place], [place] [address]  CBC with Differential     Status: Abnormal   Collection Time: 11/10/20  6:58 PM  Result Value Ref Range   WBC 10.0 4.0 - 10.5 K/uL   RBC 2.94 (L) 3.87 - 5.11 MIL/uL   Hemoglobin 12.5 12.0 - 15.0 g/dL   HCT 60.4 (L) 54.0 - 98.1 %   MCV 114.6 (H) 80.0 - 100.0 fL   MCH 42.5 (H) 26.0 - 34.0 pg   MCHC 37.1 (H) 30.0 - 36.0 g/dL   RDW 19.1 47.8 - 29.5 %   Platelets 131 (L) 150 - 400 K/uL   nRBC 0.0 0.0 - 0.2 %   Neutrophils Relative % 81 %   Neutro Abs 8.2 (H) 1.7 - 7.7 K/uL   Lymphocytes Relative 7 %   Lymphs Abs 0.7 0.7 - 4.0 K/uL   Monocytes Relative 11 %   Monocytes Absolute 1.1 (H) 0.1 - 1.0 K/uL   Eosinophils Relative 0 %    Eosinophils Absolute 0.0 0.0 - 0.5 K/uL   Basophils Relative 0 %   Basophils Absolute 0.0 0.0 - 0.1 K/uL   Immature Granulocytes 1 %   Abs Immature Granulocytes 0.05 0.00 - 0.07 K/uL    Comment: Performed at [facility], [address] [place], [place] [address]  Resp Panel by RT-PCR (Flu A&B, Covid) Nasopharyngeal Swab     Status: None   Collection Time: 11/11/20  2:44 AM   Specimen: Nasopharyngeal Swab; Nasopharyngeal(NP) swabs in vial transport medium  Result Value Ref Range   SARS Coronavirus 2 by RT PCR NEGATIVE NEGATIVE    Comment: (NOTE) SARS-CoV-2 target nucleic acids are NOT DETECTED.  The SARS-CoV-2 RNA is generally detectable in upper respiratory specimens during the acute phase of infection. The lowest concentration of SARS-CoV-2 viral copies this assay can detect is 138 copies/mL. A negative result does not preclude SARS-Cov-2 infection and should not be used as the sole basis for treatment or other patient management decisions. A negative result may occur with  improper specimen collection/handling, submission of specimen other than nasopharyngeal swab, presence of viral mutation(s) within the areas targeted by this assay, and inadequate number of viral copies(<138 copies/mL). A negative result must be combined with clinical observations, patient history, and epidemiological information. The expected result is Negative.  Fact Sheet for Patients:  BloggerCourse.com  Fact Sheet for Healthcare Providers:  SeriousBroker.it  This test is no t yet approved or cleared by the [place] FDA and  has been authorized for detection and/or diagnosis of SARS-CoV-2 by FDA under an Emergency Use Authorization (EUA). This EUA will remain  in effect (meaning this test can be used) for the duration of the COVID-19 declaration under Section 564(b)(1) of the Act, 21 U.S.C.section 360bbb-3(b)(1), unless the authorization is  terminated  or revoked sooner.       Influenza A by PCR NEGATIVE NEGATIVE   Influenza B by PCR NEGATIVE NEGATIVE    Comment: (NOTE) The Xpert Xpress SARS-CoV-2/FLU/RSV plus assay is intended as an aid in the diagnosis of influenza from Nasopharyngeal swab specimens and should not be used as a sole basis for treatment. Nasal washings and aspirates are unacceptable for Xpert Xpress SARS-CoV-2/FLU/RSV testing.  Fact Sheet for Patients: BloggerCourse.com  Fact Sheet for Healthcare Providers: SeriousBroker.it  This test is not yet approved or cleared by the [place] FDA and has been authorized for detection and/or diagnosis of SARS-CoV-2 by FDA  under an Emergency Use Authorization (EUA). This EUA will remain in effect (meaning this test can be used) for the duration of the COVID-19 declaration under Section 564(b)(1) of the Act, 21 U.S.C. section 360bbb-3(b)(1), unless the authorization is terminated or revoked.  Performed at [facility], [address] [place], [place] [address]   Basic metabolic panel     Status: Abnormal   Collection Time: 11/11/20  6:14 AM  Result Value Ref Range   Sodium 132 (L) 135 - 145 mmol/L   Potassium 4.3 3.5 - 5.1 mmol/L   Chloride 103 98 - 111 mmol/L   CO2 18 (L) 22 - 32 mmol/L   Glucose, Bld 96 70 - 99 mg/dL    Comment: Glucose reference range applies only to samples taken after fasting for at least 8 hours.   BUN 16 8 - 23 mg/dL   Creatinine, Ser 4.38 (H) 0.44 - 1.00 mg/dL   Calcium 9.1 8.9 - 37.7 mg/dL   GFR, Estimated 52 (L) >60 mL/min    Comment: (NOTE) Calculated using the CKD-EPI Creatinine Equation (2021)    Anion gap 11 5 - 15    Comment: Performed at [facility], [address] [place], [place] [address]  CBC     Status: Abnormal   Collection Time: 11/11/20  6:14 AM  Result Value Ref Range   WBC 8.1 4.0 - 10.5 K/uL   RBC 3.04 (L) 3.87 - 5.11 MIL/uL    Hemoglobin 13.1 12.0 - 15.0 g/dL   HCT 86.4 84.7 - 20.7 %   MCV 119.4 (H) 80.0 - 100.0 fL   MCH 43.1 (H) 26.0 - 34.0 pg   MCHC 36.1 (H) 30.0 - 36.0 g/dL   RDW 21.8 28.8 - 33.7 %   Platelets 135 (L) 150 - 400 K/uL   nRBC 0.0 0.0 - 0.2 %    Comment: Performed at [facility], [address], [place], [place] [address]    CT Head Wo Contrast  Result Date: 11/11/2020 CLINICAL DATA:  Recent fall yesterday morning with headaches and neck pain, initial encounter EXAM: CT HEAD WITHOUT CONTRAST CT CERVICAL SPINE WITHOUT CONTRAST TECHNIQUE: Multidetector CT imaging of the head and cervical spine was performed following the standard protocol without intravenous contrast. Multiplanar CT image reconstructions of the cervical spine were also generated. COMPARISON:  None. FINDINGS: CT HEAD FINDINGS Brain: No evidence of acute infarction, hemorrhage, hydrocephalus, extra-axial collection or mass lesion/mass effect. Mild atrophic changes are noted. Vascular: No hyperdense vessel or unexpected calcification. Skull: Normal. Negative for fracture or focal lesion. Sinuses/Orbits: No acute finding. Other: None. CT CERVICAL SPINE FINDINGS Alignment: Within normal limits. Skull base and vertebrae: 7 cervical segments are well visualized. Vertebral body height is well maintained. Disc space narrowing at C5-6 and C6-7 is noted with associated osteophytic changes. Mild facet hypertrophic changes are seen. No acute fracture or acute facet abnormality is noted. Soft tissues and spinal Andrews: Surrounding soft tissue structures are within normal limits. Upper chest: Visualized lung apices are unremarkable. Other: None IMPRESSION: CT of the head: Chronic atrophic changes without acute abnormality. CT of the cervical spine: Multilevel degenerative change without acute abnormality. Electronically Signed   By: Alcide Clever M.D.   On: 11/11/2020 03:21   CT Cervical Spine Wo Contrast  Result Date: 11/11/2020 CLINICAL DATA:   Recent fall yesterday morning with headaches and neck pain, initial encounter EXAM: CT HEAD WITHOUT CONTRAST CT CERVICAL SPINE WITHOUT CONTRAST TECHNIQUE: Multidetector CT imaging  of the head and cervical spine was performed following the standard protocol without intravenous contrast. Multiplanar CT image reconstructions of the cervical spine were also generated. COMPARISON:  None. FINDINGS: CT HEAD FINDINGS Brain: No evidence of acute infarction, hemorrhage, hydrocephalus, extra-axial collection or mass lesion/mass effect. Mild atrophic changes are noted. Vascular: No hyperdense vessel or unexpected calcification. Skull: Normal. Negative for fracture or focal lesion. Sinuses/Orbits: No acute finding. Other: None. CT CERVICAL SPINE FINDINGS Alignment: Within normal limits. Skull base and vertebrae: 7 cervical segments are well visualized. Vertebral body height is well maintained. Disc space narrowing at C5-6 and C6-7 is noted with associated osteophytic changes. Mild facet hypertrophic changes are seen. No acute fracture or acute facet abnormality is noted. Soft tissues and spinal Andrews: Surrounding soft tissue structures are within normal limits. Upper chest: Visualized lung apices are unremarkable. Other: None IMPRESSION: CT of the head: Chronic atrophic changes without acute abnormality. CT of the cervical spine: Multilevel degenerative change without acute abnormality. Electronically Signed   By: Alcide Clever M.D.   On: 11/11/2020 03:21   CT PELVIS WO CONTRAST  Result Date: 11/11/2020 CLINICAL DATA:  Recent fall with hip pain, initial encounter EXAM: CT PELVIS WITHOUT CONTRAST TECHNIQUE: Multidetector CT imaging of the pelvis was performed following the standard protocol without intravenous contrast. COMPARISON:  Plain film from the previous day. FINDINGS: Urinary Tract:  Bladder is well distended. Bowel: Visualized large and small bowel show no acute abnormality. The pelvic is within normal limits.  Vascular/Lymphatic: Sclerotic calcifications are noted without aneurysmal dilatation. Reproductive:  No mass or other significant abnormality Other:  None. Musculoskeletal: Previously seen left inferior and superior pubic rami fractures are again identified with only mild displacement at the fracture site. IMPRESSION: Left superior and inferior pubic rami fractures without significant displacement. No other fractures are seen. Electronically Signed   By: Alcide Clever M.D.   On: 11/11/2020 03:26   DG Hip Unilat W or Wo Pelvis 2-3 Views Left  Result Date: 11/10/2020 CLINICAL DATA:  Hip pain.  Status post fall. EXAM: DG HIP (WITH OR WITHOUT PELVIS) 2-3V LEFT COMPARISON:  None. FINDINGS: Both hips are located. No evidence for hip fracture or dislocation. Acute fractures involve the superior and inferior left pubic rami. Nondisplaced. Right hip appears intact and located. IMPRESSION: Acute fractures of the left superior and inferior pubic rami. Electronically Signed   By: Signa Kell M.D.   On: 11/10/2020 19:59    Review of Systems  HENT:  Negative for ear discharge, ear pain, hearing loss and tinnitus.   Eyes:  Negative for photophobia and pain.  Respiratory:  Negative for cough and shortness of breath.   Cardiovascular:  Negative for chest pain.  Gastrointestinal:  Positive for nausea. Negative for abdominal pain and vomiting.  Genitourinary:  Positive for pelvic pain. Negative for dysuria, flank pain, frequency and urgency.  Musculoskeletal:  Negative for back pain, myalgias and neck pain.  Neurological:  Negative for dizziness and headaches.  Hematological:  Does not bruise/bleed easily.  Psychiatric/Behavioral:  The patient is not nervous/anxious.   Blood pressure 119/80, pulse 82, temperature 98.9 F (37.2 C), temperature source Oral, resp. rate 18, height 5\' 7"  (1.702 m), weight 72.1 kg, SpO2 99 %. Physical Exam Constitutional:      General: She is not in acute distress.    Appearance: She  is well-developed. She is not diaphoretic.  HENT:     Head: Normocephalic and atraumatic.  Eyes:     General: No scleral  icterus.       Right eye: No discharge.        Left eye: No discharge.     Conjunctiva/sclera: Conjunctivae normal.  Cardiovascular:     Rate and Rhythm: Normal rate and regular rhythm.  Pulmonary:     Effort: Pulmonary effort is normal. No respiratory distress.  Musculoskeletal:     Cervical back: Normal range of motion.     Comments: Pelvis--no traumatic wounds or rash, no ecchymosis, stable to manual stress, Pain with lateral compression  LLE No traumatic wounds, ecchymosis, or rash  Nontender  No knee or ankle effusion  Knee stable to varus/ valgus and anterior/posterior stress  Sens DPN, SPN, TN intact  Motor EHL, ext, flex, evers 5/5  DP 1+, PT 1+, No significant edema  Skin:    General: Skin is warm and dry.  Neurological:     Mental Status: She is alert.  Psychiatric:        Mood and Affect: Mood normal.        Behavior: Behavior normal.    Assessment/Plan: Pelvic fxs -- She may be WBAT BLE. F/u with Dr. Jena Gauss in 3 weeks.    Freeman Caldron, PA-C Orthopedic Surgery [phone] 11/11/2020, 9:32 AM

## 2020-11-11 NOTE — Evaluation (Signed)
Physical Therapy Evaluation Patient Details Name: Beverly Andrews MRN: [id number] DOB: Oct 20, 1955 Today's Date: 11/11/2020  History of Present Illness  65 y.o. female admitted on 11/10/20 for fall, presyncope, and resultant L superior/infereior pubic rami pelvic fxs.  WBAT per ortho.  Pt with significant PMH of gout, HTN, non-ischemic cardiomyopathy, BIV ICD, knee arthroscopy, ETOH abuse.  Clinical Impression  Limited bed level assessment completed, pt limited by extensive pain with movement of even UEs and R LE attempts.  Pt will need to be pre medicated and even then will likely require two person assist for mobility.  She would benefit from SNF level rehab if she can qualify before returning home alone. PT will continue to follow acutely for safe mobility progression.   PT to follow acutely for deficits listed below.          Recommendations for follow up therapy are one component of a multi-disciplinary discharge planning process, led by the attending physician.  Recommendations may be updated based on patient status, additional functional criteria and insurance authorization.  Follow Up Recommendations SNF    Equipment Recommendations  Wheelchair (measurements PT);Wheelchair cushion (measurements PT);Rolling walker with 5" wheels;3in1 (PT);Hospital bed    Recommendations for Other Services       Precautions / Restrictions Precautions Precautions: Fall Restrictions Weight Bearing Restrictions: No RLE Weight Bearing: Weight bearing as tolerated LLE Weight Bearing: Weight bearing as tolerated      Mobility  Bed Mobility Overal bed mobility: Needs Assistance             General bed mobility comments: Pt unable to lift HOB >30 degrees due to pain at this time, shaking in pain.  RN brining pain meds at the end of the session.  Will need significant premedication.    Transfers                    Ambulation/Gait                Stairs             Wheelchair Mobility    Modified Rankin (Stroke Patients Only)       Balance                                             Pertinent Vitals/Pain Pain Assessment: 0-10 Pain Score: 8  Pain Location: left hip and groin Pain Descriptors / Indicators: Aching;Burning Pain Intervention(s): Limited activity within patient's tolerance;Monitored during session;Repositioned    Home Living Family/patient expects to be discharged to:: Private residence Living Arrangements: Alone Available Help at Discharge: Family (sister lives in town, mom lives in [place]) Type of Home: House Home Access: Stairs to enter Entrance Stairs-Rails: None Secretary/administrator of Steps: 1 (curb) Home Layout: One level Home Equipment: Cane - single point Additional Comments: "I do nothing" re-hobbies, etc since retirement, had a bad gout flare all summer.    Prior Function Level of Independence: Independent         Comments: retired end of May, drives, does everything independently     Hand Dominance   Dominant Hand: Right    Extremity/Trunk Assessment   Upper Extremity Assessment Upper Extremity Assessment: Defer to OT evaluation    Lower Extremity Assessment Lower Extremity Assessment: RLE deficits/detail;LLE deficits/detail RLE Deficits / Details: attempted to test R LE with difficulty due to pain in left  pelvis with any R LE movement.  Ankle 3-/5, knee 2-/5, hip NT due to pain in left pelvis especially with eccentricl lowering of right leg back to bed after doing a limited heel slide. LLE: Unable to fully assess due to pain    Cervical / Trunk Assessment Cervical / Trunk Assessment: Normal  Communication   Communication: No difficulties  Cognition Arousal/Alertness: Awake/alert Behavior During Therapy: WFL for tasks assessed/performed Overall Cognitive Status: Within Functional Limits for tasks assessed                                         General Comments General comments (skin integrity, edema, etc.): O2 sats and BP stable in supine on RA, HR with pain increased to 120s.    Exercises     Assessment/Plan    PT Assessment Patient needs continued PT services  PT Problem List Decreased strength;Decreased range of motion;Decreased activity tolerance;Decreased balance;Decreased mobility;Decreased knowledge of use of DME;Decreased safety awareness;Decreased knowledge of precautions;Cardiopulmonary status limiting activity;Pain       PT Treatment Interventions DME instruction;Stair training;Gait training;Functional mobility training;Therapeutic activities;Therapeutic exercise;Balance training;Neuromuscular re-education;Cognitive remediation;Patient/family education;Modalities;Manual techniques;Wheelchair mobility training    PT Goals (Current goals can be found in the Care Plan section)  Acute Rehab PT Goals Patient Stated Goal: to decrease pain, go to rehab PT Goal Formulation: With patient/family Time For Goal Achievement: 11/25/20 Potential to Achieve Goals: Fair    Frequency Min 3X/week   Barriers to discharge Inaccessible home environment;Decreased caregiver support      Co-evaluation               AM-PAC PT "6 Clicks" Mobility  Outcome Measure Help needed turning from your back to your side while in a flat bed without using bedrails?: Total Help needed moving from lying on your back to sitting on the side of a flat bed without using bedrails?: Total Help needed moving to and from a bed to a chair (including a wheelchair)?: Total Help needed standing up from a chair using your arms (e.g., wheelchair or bedside chair)?: Total Help needed to walk in hospital room?: Total Help needed climbing 3-5 steps with a railing? : Total 6 Click Score: 6    End of Session   Activity Tolerance: Patient limited by pain Patient left: in bed;with call bell/phone within reach;with nursing/sitter in room;with family/visitor  present Nurse Communication: Mobility status PT Visit Diagnosis: Muscle weakness (generalized) (M62.81);Difficulty in walking, not elsewhere classified (R26.2);Pain Pain - Right/Left: Left Pain - part of body: Hip    Time: 2952-8413 PT Time Calculation (min) (ACUTE ONLY): 24 min   Charges:   PT Evaluation $PT Eval Moderate Complexity: 1 Mod     Corinna Capra, PT, DPT  Acute Rehabilitation Ortho Tech Supervisor [phone] pager #[phone] office    11/11/2020, 2:12 PM

## 2020-11-11 NOTE — ED Notes (Signed)
Placed Breakfast orders 

## 2020-11-11 NOTE — ED Notes (Signed)
Provider at bedside

## 2020-11-11 NOTE — ED Notes (Signed)
Early yesterday morning attempted to ambulate to the restroom made it to the the restroom and developed cold sweats, felt light headed, seeing black attempted to stand up to go back to bed and fell on rt side.denies any LOC but had to lay there a few min before attempting to get up. Pt then stood up using sink for support and fell really hard again on the lt side this time. Stayed on the floor for a few min and then crawled back to bed. Later during the day was able to ambulate to the restroom again with support and made it back to bed. At that time called family member to come get her to get evaluated due to the pain.

## 2020-11-11 NOTE — ED Notes (Signed)
Provider at bedside at this time. Pt moved to stretcher

## 2020-11-11 NOTE — H&P (Signed)
History and Physical    Beverly Andrews JKK:[id number] DOB: 08-10-1955 DOA: 11/10/2020  PCP: Deatra James, MD Patient coming from: Home  Chief Complaint: Falls, left hip pain  HPI: Beverly Andrews is a 65 y.o. female with medical history significant of alcohol abuse, gout, hypertension, nonischemic cardiomyopathy status post Medtronic CRT-D, LBBB, noncompaction cardiomyopathy presented to the ED complaining of left hip pain after having 2 falls at home.  Vital signs stable.  Labs notable for platelet count 131k.  Sodium 129.  CT head and C-spine negative for acute finding.  Pelvic CT showing left superior and inferior pubic rami fractures without significant displacement. Patient was given 1 L IV fluid, morphine, Percocet, and Zofran.  ED physician sent a secure chat message to Dr. Jena Gauss from orthopedics requesting consultation in the morning.  Patient states yesterday she woke up at 5 AM and went to use the bathroom as she felt that her stomach was upset.  States while sitting on the commode she felt dizzy and was having cold sweats.  She felt like she was going to pass out but did not lose consciousness.  When she tried to get up from the commode she fell to her right side.  She then managed to get up and soon after fell hard on her left hip.  She did not hit her head.  She then stayed on the floor for several minutes and then crawled back to her bed.  Due to severe pain in her left hip, she was not able to get out of her bed and walk.  When EMS came she again crawled to open the door.  Reports chronic shortness of breath due to heart failure.  Denies chest pain.  Denies nausea, vomiting, or abdominal pain.    Review of Systems:  All systems reviewed and apart from history of presenting illness, are negative.  Past Medical History:  Diagnosis Date   Alcohol abuse    Gout    History of cardiac cath 09/24/2017   09/24/17: normal coronary arteries, EF <25%   Hypertension    Non-ischemic  cardiomyopathy (HCC) 09/18/2017   09/18/17- EF 20-25%   Recurrent pneumonia     Past Surgical History:  Procedure Laterality Date   BIV ICD INSERTION CRT-D N/A 11/02/2018   Procedure: BIV ICD INSERTION CRT-D;  Surgeon: Regan Lemming, MD;  Location: [facility] INVASIVE CV LAB;  Service: Cardiovascular;  Laterality: N/A;   KNEE ARTHROSCOPY     RIGHT/LEFT HEART CATH AND CORONARY ANGIOGRAPHY N/A 09/20/2017   Procedure: RIGHT/LEFT HEART CATH AND CORONARY ANGIOGRAPHY;  Surgeon: Iran Ouch, MD;  Location: [facility] INVASIVE CV LAB;  Service: Cardiovascular;  Laterality: N/A;   TONSILLECTOMY       reports that she has quit smoking. She has never used smokeless tobacco. She reports current alcohol use of about 2.0 standard drinks per week. She reports that she does not use drugs.  Allergies  Allergen Reactions   Amoxicillin Itching and Swelling   Carbocaine [Mepivacaine] Hives and Swelling   Penicillins Itching and Swelling    Did it involve swelling of the face/tongue/throat, SOB, or low BP? Yes Did it involve sudden or severe rash/hives, skin peeling, or any reaction on the inside of your mouth or nose? Yes Did you need to seek medical attention at a hospital or doctor's office? No When did it last happen?      10 + years If all above answers are "NO", may proceed with cephalosporin use.  Shellfish Allergy Swelling    Family History  Problem Relation Age of Onset   AAA (abdominal aortic aneurysm) Father    Aortic stenosis Father    Prostate cancer Father     Prior to Admission medications   Medication Sig Start Date End Date Taking? Authorizing Provider  acetaminophen (TYLENOL) 500 MG tablet Take 1,000 mg by mouth every 6 (six) hours as needed for moderate pain or headache.   Yes [provider]  albuterol (PROVENTIL HFA;VENTOLIN HFA) 108 (90 Base) MCG/ACT inhaler Inhale 1-2 puffs into the lungs every 6 (six) hours as needed for wheezing or shortness of breath.   Yes  [provider]  allopurinol (ZYLOPRIM) 100 MG tablet Take 100 mg by mouth daily. 10/08/20  Yes [provider]  BIOTIN PO Take 500 mcg by mouth daily.   Yes [provider]  carvedilol (COREG) 3.125 MG tablet TAKE 1 TABLET BY MOUTH TWICE DAILY WITH A MEAL Patient taking differently: Take 3.125 mg by mouth 2 (two) times daily with a meal. 10/23/19  Yes Bensimhon, Bevelyn Buckles, MD  diphenhydrAMINE (BENADRYL) 25 MG tablet Take 75 mg by mouth at bedtime as needed for sleep.    Yes [provider]  furosemide (LASIX) 20 MG tablet Take 20 mg by mouth daily as needed for fluid or edema (TAKE  AN  ADDITIONAL  TABLET  IF  WEIGHT  INCREASES  BY  2  TO  3  POUNDS.).   Yes [provider]  ibuprofen (ADVIL) 200 MG tablet Take 800 mg by mouth every 6 (six) hours as needed for headache or moderate pain.   Yes [provider]  potassium chloride SA (KLOR-CON) 20 MEQ tablet Take 1 tablet (20 mEq total) by mouth daily. Please make overdue appt for future refills Thank you 2nd attempt Patient taking differently: Take 20 mEq by mouth daily. 10/16/20  Yes Camnitz, Will Daphine Deutscher, MD  sacubitril-valsartan (ENTRESTO) 24-26 MG Take 1 tablet by mouth 2 (two) times daily. 03/11/20  Yes Camnitz, Will Daphine Deutscher, MD  spironolactone (ALDACTONE) 25 MG tablet Take 1 tablet (25 mg total) by mouth daily. Absolute last refill without office visit-please call [phone] Patient taking differently: Take 25 mg by mouth daily. 11/04/20  Yes Bensimhon, Bevelyn Buckles, MD  Multiple Vitamin (MULTIVITAMIN WITH MINERALS) TABS tablet Take 1 tablet by mouth daily. Patient not taking: Reported on 11/11/2020 09/26/17   Berton Bon, NP    Physical Exam: Vitals:   11/11/20 0148 11/11/20 0230 11/11/20 0330 11/11/20 0400  BP: 108/71 122/77 112/73 113/74  Pulse: 77 79 74 80  Resp: 16 15 11 18   Temp:      TempSrc:      SpO2: 98% 100% 98% 98%  Weight:      Height:        Physical Exam Constitutional:       General: She is not in acute distress. HENT:     Head: Normocephalic and atraumatic.  Eyes:     Extraocular Movements: Extraocular movements intact.     Conjunctiva/sclera: Conjunctivae normal.  Cardiovascular:     Rate and Rhythm: Normal rate and regular rhythm.     Pulses: Normal pulses.  Pulmonary:     Effort: Pulmonary effort is normal. No respiratory distress.     Breath sounds: Normal breath sounds. No wheezing or rales.  Abdominal:     General: Bowel sounds are normal. There is no distension.     Palpations: Abdomen is soft.  Tenderness: There is no abdominal tenderness.  Musculoskeletal:        General: No swelling or tenderness.     Cervical back: Normal range of motion and neck supple.  Skin:    General: Skin is warm and dry.  Neurological:     General: No focal deficit present.     Mental Status: She is alert and oriented to person, place, and time.     Labs on Admission: I have personally reviewed following labs and imaging studies  CBC: Recent Labs  Lab 11/10/20 1858  WBC 10.0  NEUTROABS 8.2*  HGB 12.5  HCT 33.7*  MCV 114.6*  PLT 131*   Basic Metabolic Panel: Recent Labs  Lab 11/10/20 1858  NA 129*  K 4.9  CL 98  CO2 21*  GLUCOSE 125*  BUN 17  CREATININE 1.32*  CALCIUM 9.4   GFR: Estimated Creatinine Clearance: 41.9 mL/min (A) (by C-G formula based on SCr of 1.32 mg/dL (H)). Liver Function Tests: No results for input(s): AST, ALT, ALKPHOS, BILITOT, PROT, ALBUMIN in the last 168 hours. No results for input(s): LIPASE, AMYLASE in the last 168 hours. No results for input(s): AMMONIA in the last 168 hours. Coagulation Profile: No results for input(s): INR, PROTIME in the last 168 hours. Cardiac Enzymes: No results for input(s): CKTOTAL, CKMB, CKMBINDEX, TROPONINI in the last 168 hours. BNP (last 3 results) No results for input(s): PROBNP in the last 8760 hours. HbA1C: No results for input(s): HGBA1C in the last 72 hours. CBG: No results  for input(s): GLUCAP in the last 168 hours. Lipid Profile: No results for input(s): CHOL, HDL, LDLCALC, TRIG, CHOLHDL, LDLDIRECT in the last 72 hours. Thyroid Function Tests: No results for input(s): TSH, T4TOTAL, FREET4, T3FREE, THYROIDAB in the last 72 hours. Anemia Panel: No results for input(s): VITAMINB12, FOLATE, FERRITIN, TIBC, IRON, RETICCTPCT in the last 72 hours. Urine analysis: No results found for: COLORURINE, APPEARANCEUR, LABSPEC, PHURINE, GLUCOSEU, HGBUR, BILIRUBINUR, KETONESUR, PROTEINUR, UROBILINOGEN, NITRITE, LEUKOCYTESUR  Radiological Exams on Admission: CT Head Wo Contrast  Result Date: 11/11/2020 CLINICAL DATA:  Recent fall yesterday morning with headaches and neck pain, initial encounter EXAM: CT HEAD WITHOUT CONTRAST CT CERVICAL SPINE WITHOUT CONTRAST TECHNIQUE: Multidetector CT imaging of the head and cervical spine was performed following the standard protocol without intravenous contrast. Multiplanar CT image reconstructions of the cervical spine were also generated. COMPARISON:  None. FINDINGS: CT HEAD FINDINGS Brain: No evidence of acute infarction, hemorrhage, hydrocephalus, extra-axial collection or mass lesion/mass effect. Mild atrophic changes are noted. Vascular: No hyperdense vessel or unexpected calcification. Skull: Normal. Negative for fracture or focal lesion. Sinuses/Orbits: No acute finding. Other: None. CT CERVICAL SPINE FINDINGS Alignment: Within normal limits. Skull base and vertebrae: 7 cervical segments are well visualized. Vertebral body height is well maintained. Disc space narrowing at C5-6 and C6-7 is noted with associated osteophytic changes. Mild facet hypertrophic changes are seen. No acute fracture or acute facet abnormality is noted. Soft tissues and spinal canal: Surrounding soft tissue structures are within normal limits. Upper chest: Visualized lung apices are unremarkable. Other: None IMPRESSION: CT of the head: Chronic atrophic changes without  acute abnormality. CT of the cervical spine: Multilevel degenerative change without acute abnormality. Electronically Signed   By: Alcide Clever M.D.   On: 11/11/2020 03:21   CT Cervical Spine Wo Contrast  Result Date: 11/11/2020 CLINICAL DATA:  Recent fall yesterday morning with headaches and neck pain, initial encounter EXAM: CT HEAD WITHOUT CONTRAST CT CERVICAL SPINE WITHOUT CONTRAST  TECHNIQUE: Multidetector CT imaging of the head and cervical spine was performed following the standard protocol without intravenous contrast. Multiplanar CT image reconstructions of the cervical spine were also generated. COMPARISON:  None. FINDINGS: CT HEAD FINDINGS Brain: No evidence of acute infarction, hemorrhage, hydrocephalus, extra-axial collection or mass lesion/mass effect. Mild atrophic changes are noted. Vascular: No hyperdense vessel or unexpected calcification. Skull: Normal. Negative for fracture or focal lesion. Sinuses/Orbits: No acute finding. Other: None. CT CERVICAL SPINE FINDINGS Alignment: Within normal limits. Skull base and vertebrae: 7 cervical segments are well visualized. Vertebral body height is well maintained. Disc space narrowing at C5-6 and C6-7 is noted with associated osteophytic changes. Mild facet hypertrophic changes are seen. No acute fracture or acute facet abnormality is noted. Soft tissues and spinal canal: Surrounding soft tissue structures are within normal limits. Upper chest: Visualized lung apices are unremarkable. Other: None IMPRESSION: CT of the head: Chronic atrophic changes without acute abnormality. CT of the cervical spine: Multilevel degenerative change without acute abnormality. Electronically Signed   By: Alcide Clever M.D.   On: 11/11/2020 03:21   CT PELVIS WO CONTRAST  Result Date: 11/11/2020 CLINICAL DATA:  Recent fall with hip pain, initial encounter EXAM: CT PELVIS WITHOUT CONTRAST TECHNIQUE: Multidetector CT imaging of the pelvis was performed following the standard  protocol without intravenous contrast. COMPARISON:  Plain film from the previous day. FINDINGS: Urinary Tract:  Bladder is well distended. Bowel: Visualized large and small bowel show no acute abnormality. The pelvic is within normal limits. Vascular/Lymphatic: Sclerotic calcifications are noted without aneurysmal dilatation. Reproductive:  No mass or other significant abnormality Other:  None. Musculoskeletal: Previously seen left inferior and superior pubic rami fractures are again identified with only mild displacement at the fracture site. IMPRESSION: Left superior and inferior pubic rami fractures without significant displacement. No other fractures are seen. Electronically Signed   By: Alcide Clever M.D.   On: 11/11/2020 03:26   DG Hip Unilat W or Wo Pelvis 2-3 Views Left  Result Date: 11/10/2020 CLINICAL DATA:  Hip pain.  Status post fall. EXAM: DG HIP (WITH OR WITHOUT PELVIS) 2-3V LEFT COMPARISON:  None. FINDINGS: Both hips are located. No evidence for hip fracture or dislocation. Acute fractures involve the superior and inferior left pubic rami. Nondisplaced. Right hip appears intact and located. IMPRESSION: Acute fractures of the left superior and inferior pubic rami. Electronically Signed   By: Signa Kell M.D.   On: 11/10/2020 19:59    EKG: Independently reviewed.  Interpretation limited secondary to paced rhythm.  Assessment/Plan Principal Problem:   Pelvic fracture [facility]) Active Problems:   Non-ischemic cardiomyopathy (HCC)   Fall at home, initial encounter   Hyponatremia   Thrombocytopenia (HCC)   Pelvic fractures Secondary to fall. Pelvic CT showing left superior and inferior pubic rami fractures without significant displacement. -Orthopedics consulted by ED physician.  Continue pain management.  PT/OT eval.  Near syncope Likely vasovagal event as it happened while she was on the commode.  ?Orthostatic hypotension. CT head negative for acute finding.  Vital signs stable in the  ED, not hypoxic.  Not endorsing any chest pain. -Check orthostatics, fall precautions  Hyponatremia Sodium 129.  Patient was given 1 L normal saline in the ED. -Repeat BMP  Mild thrombocytopenia No signs of active bleeding. -Continue to monitor  Chronic systolic CHF/nonischemic cardiomyopathy EF 30% on echo done in February 2020.  Status post Medtronic CRT-D September 2020.  EF improved to 55% on echo done March 2021.  Takes Coreg, Lasix, Entresto, and spironolactone.  No signs of volume overload. -Hold home medications at this time and check orthostatics  Hypertension -Hold antihypertensives at this time  Alcohol abuse -CIWA protocol; Ativan as needed.  Thiamine, folate, and multivitamin.  Gout -Continue allopurinol  DVT prophylaxis: Subcutaneous heparin Code Status: Full code Family Communication: No family available at this time. Disposition Plan: Status is: Observation  The patient remains OBS appropriate and will d/c before 2 midnights.  Dispo: The patient is from: Home              Anticipated d/c is to: Home              Patient currently is not medically stable to d/c.   Difficult to place patient No  Level of care:  Level of care: Med-Surg  The medical decision making on this patient was of high complexity and the patient is at high risk for clinical deterioration, therefore this is a level 3 visit.  John Giovanni MD [facility]  If 7PM-7AM, please contact night-coverage [email or web address]  11/11/2020, 4:29 AM

## 2020-11-11 NOTE — NC FL2 (Signed)
[place] MEDICAID FL2 LEVEL OF CARE SCREENING TOOL     IDENTIFICATION  Patient Name: Beverly Andrews Birthdate: [birth year] Sex: female Admission Date (Current Location): 11/10/2020  [facility] and [place] Number:  Producer, television/film/video and Address:  [facility], [address] [place], [place] [address]      Provider Number: [id number]  Attending Physician Name and Address:  Darlin Drop, DO  Relative Name and Phone Number:  herrin,jennifer Sister   [phone]    Current Level of Care: Hospital Recommended Level of Care: Skilled Nursing Facility Prior Approval Number:    Date Approved/Denied:   PASRR Number: [id number]  Discharge Plan: SNF    Current Diagnoses: Patient Active Problem List   Diagnosis Date Noted   Pelvic fracture (HCC) 11/11/2020   Fall at home, initial encounter 11/11/2020   Hyponatremia 11/11/2020   Thrombocytopenia (HCC) 11/11/2020   Snoring 09/10/2019   ICD (implantable cardioverter-defibrillator) in place 11/04/2018   Chronic systolic (congestive) heart failure (HCC) 11/02/2018   Non-ischemic cardiomyopathy (HCC) 10/03/2017   Normal coronary arteries 10/03/2017   Acute systolic heart failure (HCC)    Left bundle branch block 09/19/2017   DOE (dyspnea on exertion) 09/19/2017   Hypertension 09/19/2017   Alcohol abuse 09/19/2017    Orientation RESPIRATION BLADDER Height & Weight     Self, Time, Situation, Place  Normal Continent, External catheter (Pt on catheter due to mobility issues) Weight: 159 lb (72.1 kg) Height:  5\' 7"  (170.2 cm)  BEHAVIORAL SYMPTOMS/MOOD NEUROLOGICAL BOWEL NUTRITION STATUS      Continent Diet (Low sodium)  AMBULATORY STATUS COMMUNICATION OF NEEDS Skin   Extensive Assist Verbally Normal                       Personal Care Assistance Level of Assistance  Bathing, Feeding, Dressing Bathing Assistance: Limited assistance Feeding assistance: Independent Dressing Assistance: Maximum  assistance     Functional Limitations Info  Sight, Hearing, Speech Sight Info: Adequate Hearing Info: Adequate Speech Info: Adequate    SPECIAL CARE FACTORS FREQUENCY  PT (By licensed PT), OT (By licensed OT)     PT Frequency: 5x weekly OT Frequency: 5x weekly            Contractures Contractures Info: Not present    Additional Factors Info  Code Status, Allergies Code Status Info: Full Allergies Info: (4) Amoxicillin,Penicillin,Carbocaine, Shellfish           Current Medications (11/11/2020):  This is the current hospital active medication list Current Facility-Administered Medications  Medication Dose Route Frequency Provider Last Rate Last Admin   acetaminophen (TYLENOL) tablet 650 mg  650 mg Oral Q6H PRN 01/11/2021, MD       Or   acetaminophen (TYLENOL) suppository 650 mg  650 mg Rectal Q6H PRN John Giovanni, MD       allopurinol (ZYLOPRIM) tablet 100 mg  100 mg Oral Daily John Giovanni, MD   100 mg at 11/11/20 1035   folic acid (FOLVITE) tablet 1 mg  1 mg Oral Daily 01/11/21, MD   1 mg at 11/11/20 1035   heparin injection 5,000 Units  5,000 Units Subcutaneous Q8H 01/11/21, MD   5,000 Units at 11/11/20 2158   lactated ringers infusion   Intravenous Continuous 2159 N, DO 30 mL/hr at 11/11/20 1600 New Bag at 11/11/20 1600   LORazepam (ATIVAN) tablet 1-4 mg  1-4 mg Oral Q1H PRN 01/11/21, MD  Or   LORazepam (ATIVAN) injection 1-4 mg  1-4 mg Intravenous Q1H PRN John Giovanni, MD       morphine 2 MG/ML injection 1 mg  1 mg Intravenous Q4H PRN John Giovanni, MD       multivitamin with minerals tablet 1 tablet  1 tablet Oral Daily John Giovanni, MD   1 tablet at 11/11/20 1035   oxyCODONE-acetaminophen (PERCOCET/ROXICET) 5-325 MG per tablet 1-2 tablet  1-2 tablet Oral Q6H PRN John Giovanni, MD   2 tablet at 11/11/20 2207   thiamine tablet 100 mg  100 mg Oral Daily John Giovanni, MD   100 mg  at 11/11/20 1035   Or   thiamine (B-1) injection 100 mg  100 mg Intravenous Daily John Giovanni, MD       Current Outpatient Medications  Medication Sig Dispense Refill   acetaminophen (TYLENOL) 500 MG tablet Take 1,000 mg by mouth every 6 (six) hours as needed for moderate pain or headache.     albuterol (PROVENTIL HFA;VENTOLIN HFA) 108 (90 Base) MCG/ACT inhaler Inhale 1-2 puffs into the lungs every 6 (six) hours as needed for wheezing or shortness of breath.     allopurinol (ZYLOPRIM) 100 MG tablet Take 100 mg by mouth daily.     BIOTIN PO Take 500 mcg by mouth daily.     carvedilol (COREG) 3.125 MG tablet TAKE 1 TABLET BY MOUTH TWICE DAILY WITH A MEAL (Patient taking differently: Take 3.125 mg by mouth 2 (two) times daily with a meal.) 180 tablet 3   diphenhydrAMINE (BENADRYL) 25 MG tablet Take 75 mg by mouth at bedtime as needed for sleep.      furosemide (LASIX) 20 MG tablet Take 20 mg by mouth daily as needed for fluid or edema (TAKE  AN  ADDITIONAL  TABLET  IF  WEIGHT  INCREASES  BY  2  TO  3  POUNDS.).     ibuprofen (ADVIL) 200 MG tablet Take 800 mg by mouth every 6 (six) hours as needed for headache or moderate pain.     potassium chloride SA (KLOR-CON) 20 MEQ tablet Take 1 tablet (20 mEq total) by mouth daily. Please make overdue appt for future refills Thank you 2nd attempt (Patient taking differently: Take 20 mEq by mouth daily.) 15 tablet 0   sacubitril-valsartan (ENTRESTO) 24-26 MG Take 1 tablet by mouth 2 (two) times daily. 180 tablet 1   spironolactone (ALDACTONE) 25 MG tablet Take 1 tablet (25 mg total) by mouth daily. Absolute last refill without office visit-please call [phone] (Patient taking differently: Take 25 mg by mouth daily.) 60 tablet 0   Multiple Vitamin (MULTIVITAMIN WITH MINERALS) TABS tablet Take 1 tablet by mouth daily. (Patient not taking: Reported on 11/11/2020) 90 tablet 3     Discharge Medications: Please see discharge summary for a list of  discharge medications.  Relevant Imaging Results:  Relevant Lab Results:   Additional Information SSN# [id number]/ Pt has not received Covid vaccines due to heart issues, prefers not to have vaccines.  Kissy Cielo, LCSW

## 2020-11-11 NOTE — ED Notes (Signed)
Patient could not tolerate orthostatics at this time

## 2020-11-11 NOTE — Progress Notes (Addendum)
Beverly Andrews is a 65 y.o. female with medical history significant of alcohol abuse, gout, hypertension, nonischemic cardiomyopathy status post Medtronic CRT-D, LBBB, noncompaction cardiomyopathy presented to the ED complaining of left hip pain after having 2 falls at home.  Vital signs stable.  Labs notable for platelet count 131k.  Sodium 129.  CT head and C-spine negative for acute finding.  Pelvic CT showing left superior and inferior pubic rami fractures without significant displacement. Patient was given 1 L IV fluid, morphine, Percocet, and Zofran.  ED physician sent a secure chat message to Dr. Jena Gauss from orthopedics requesting consultation in the morning.   Patient states yesterday she woke up at 5 AM and went to use the bathroom as she felt that her stomach was upset.  States while sitting on the commode she felt dizzy and was having cold sweats.  She felt like she was going to pass out but did not lose consciousness.  When she tried to get up from the commode she fell to her right side.  She then managed to get up and soon after fell hard on her left hip.  She did not hit her head.  She then stayed on the floor for several minutes and then crawled back to her bed.  Due to severe pain in her left hip, she was not able to get out of her bed and walk.  When EMS came she again crawled to open the door.  Reports chronic shortness of breath due to heart failure.  Denies chest pain.  Denies nausea, vomiting, or abdominal pain.    11/11/20: Seen and examined at her bedside in the ED.  Her pain is currently well controlled on pain medications.  No other complaints at this time.  Seen by PT OT with recommendation for SNF.  TOC consulted to assist with SNF placement, DC planning.  Please refer to H&P dictated by my partner Dr. Loney Loh on 11/11/2020 for further details of the assessment and plan.

## 2020-11-12 ENCOUNTER — Encounter (HOSPITAL_COMMUNITY): Payer: Self-pay | Admitting: Internal Medicine

## 2020-11-12 DIAGNOSIS — S329XXA Fracture of unspecified parts of lumbosacral spine and pelvis, initial encounter for closed fracture: Secondary | ICD-10-CM | POA: Diagnosis not present

## 2020-11-12 DIAGNOSIS — W19XXXA Unspecified fall, initial encounter: Secondary | ICD-10-CM | POA: Diagnosis not present

## 2020-11-12 DIAGNOSIS — Y92009 Unspecified place in unspecified non-institutional (private) residence as the place of occurrence of the external cause: Secondary | ICD-10-CM | POA: Diagnosis not present

## 2020-11-12 DIAGNOSIS — I5022 Chronic systolic (congestive) heart failure: Secondary | ICD-10-CM

## 2020-11-12 MED ORDER — SENNOSIDES-DOCUSATE SODIUM 8.6-50 MG PO TABS
2.0000 | ORAL_TABLET | Freq: Every day | ORAL | Status: DC
  Administered 2020-11-12 – 2020-11-14 (×3): 2 via ORAL
  Filled 2020-11-12 (×3): qty 2

## 2020-11-12 MED ORDER — ENOXAPARIN SODIUM 40 MG/0.4ML IJ SOSY
40.0000 mg | PREFILLED_SYRINGE | INTRAMUSCULAR | Status: DC
  Administered 2020-11-12 – 2020-11-13 (×2): 40 mg via SUBCUTANEOUS
  Filled 2020-11-12 (×2): qty 0.4

## 2020-11-12 MED ORDER — TRAMADOL HCL 50 MG PO TABS
50.0000 mg | ORAL_TABLET | Freq: Two times a day (BID) | ORAL | Status: AC
Start: 2020-11-12 — End: 2020-11-13
  Administered 2020-11-12 – 2020-11-13 (×4): 50 mg via ORAL
  Filled 2020-11-12 (×4): qty 1

## 2020-11-12 NOTE — Progress Notes (Signed)
Physical Therapy Treatment Patient Details Name: Beverly Andrews MRN: [id number] DOB: Mar 16, 1955 Today's Date: 11/12/2020   History of Present Illness 65 y.o. female admitted on 11/10/20 for fall, presyncope, and resultant L superior/infereior pubic rami pelvic fxs.  WBAT per ortho.  Pt with significant PMH of gout, HTN, non-ischemic cardiomyopathy, BIV ICD, knee arthroscopy, ETOH abuse.    PT Comments    Pt admitted with above diagnosis. Pt was able to pivot with assist of 1 to the chair. Needed incr assist for bed mobility. Pt anxious intiially but once to eOB, anxieity improved and pt was able to get to chair.  Will follow acutely.  Pt currently with functional limitations due to balance and endurance deficits. Pt will benefit from skilled PT to increase their independence and safety with mobility to allow discharge to the venue listed below.      Recommendations for follow up therapy are one component of a multi-disciplinary discharge planning process, led by the attending physician.  Recommendations may be updated based on patient status, additional functional criteria and insurance authorization.  Follow Up Recommendations  SNF     Equipment Recommendations  Wheelchair (measurements PT);Wheelchair cushion (measurements PT);Rolling walker with 5" wheels;3in1 (PT);Hospital bed    Recommendations for Other Services       Precautions / Restrictions Precautions Precautions: Fall Restrictions RLE Weight Bearing: Weight bearing as tolerated LLE Weight Bearing: Weight bearing as tolerated     Mobility  Bed Mobility Overal bed mobility: Needs Assistance Bed Mobility: Supine to Sit     Supine to sit: Max assist;+2 for physical assistance;HOB elevated     General bed mobility comments: Pt needed incr assist for LEs and elevation of trunk assist with pt resisting movement alot initially.  Once to EOB, pt was able to relax and sit up after giving her a few minutes and some breathing  techniques.    Transfers Overall transfer level: Needs assistance Equipment used: Rolling walker (2 wheeled) Transfers: Sit to/from [employer] Sit to Stand: Min assist;From elevated surface Stand pivot transfers: Min assist       General transfer comment: Pt was able to power up wtih min assist with cues for hand placement and able to take pivotal steps around to the chair with min assist and cues.  Ambulation/Gait                 Stairs             Wheelchair Mobility    Modified Rankin (Stroke Patients Only)       Balance Overall balance assessment: Needs assistance Sitting-balance support: No upper extremity supported;Feet supported Sitting balance-Leahy Scale: Poor Sitting balance - Comments: intiially needing min to mod to sit EOB but with time obtained balance   Standing balance support: Bilateral upper extremity supported;During functional activity Standing balance-Leahy Scale: Poor Standing balance comment: relies on UE support on RW                            Cognition Arousal/Alertness: Awake/alert Behavior During Therapy: [facility] for tasks assessed/performed;Anxious Overall Cognitive Status: Within Functional Limits for tasks assessed                                        Exercises General Exercises - Lower Extremity Ankle Circles/Pumps: AROM;Both;10 reps;Supine Quad Sets: AROM;Both;10 reps;Supine Long Arc Quad: AROM;Both;10  reps;Seated    General Comments General comments (skin integrity, edema, etc.): VSS      Pertinent Vitals/Pain Pain Assessment: 0-10 Pain Score: 10-Worst pain ever Pain Location: left hip and groin Pain Descriptors / Indicators: Aching;Burning Pain Intervention(s): Limited activity within patient's tolerance;Monitored during session;Repositioned;Premedicated before session    Home Living                      Prior Function            PT Goals (current  goals can now be found in the care plan section) Acute Rehab PT Goals Patient Stated Goal: to decrease pain, go to rehab Progress towards PT goals: Progressing toward goals    Frequency    Min 3X/week      PT Plan Current plan remains appropriate    Co-evaluation              AM-PAC PT "6 Clicks" Mobility   Outcome Measure  Help needed turning from your back to your side while in a flat bed without using bedrails?: A Lot Help needed moving from lying on your back to sitting on the side of a flat bed without using bedrails?: A Lot Help needed moving to and from a bed to a chair (including a wheelchair)?: A Little Help needed standing up from a chair using your arms (e.g., wheelchair or bedside chair)?: A Little Help needed to walk in hospital room?: Total Help needed climbing 3-5 steps with a railing? : Total 6 Click Score: 12    End of Session Equipment Utilized During Treatment: Gait belt Activity Tolerance: Patient limited by pain;Patient limited by fatigue Patient left: with call bell/phone within reach;in chair;with chair alarm set Nurse Communication: Mobility status PT Visit Diagnosis: Muscle weakness (generalized) (M62.81);Difficulty in walking, not elsewhere classified (R26.2);Pain Pain - Right/Left: Left Pain - part of body: Hip     Time: 8527-7824 PT Time Calculation (min) (ACUTE ONLY): 24 min  Charges:  $Gait Training: 8-22 mins $Therapeutic Exercise: 8-22 mins                     Indiya Izquierdo M,PT Acute Rehab Services [phone] (pager)    Bevelyn Buckles 11/12/2020, 2:34 PM

## 2020-11-12 NOTE — Progress Notes (Addendum)
HF CSW spoke with Ms. Tallo at bedside regarding substance use consult. Ms. Werntz denied concerns regarding her alcohol use and did not want resources offered by the CSW. Ms. Knapper reported that she drinks casually and isn't concerned about her alcohol use at this time. CSW completed a brief SDOH with Ms. Leidner who denied having any social needs at this time.   Pawel Soules, MSW, LCSWA [phone] Heart Failure Social Worker

## 2020-11-12 NOTE — TOC Initial Note (Signed)
Transition of Care [facility]) - Initial/Assessment Note    Patient Details  Name: Beverly Andrews MRN: [id number] Date of Birth: 24-Mar-1955  Transition of Care [facility]) CM/SW Contact:    Alyia Lacerte, LCSWA Phone Number: 11/12/2020, 12:00 PM  Clinical Narrative:                 HF CSW received consult for possible SNF placement at time of discharge. CSW spoke with Ms. Klos at bedside and she reported being aware of needing rehab and the they started the [facility] paperwork for her while she was in the ED. Patient reported that she lives alone and is currently unable to care for herself at home given her current physical needs and fall risk. Patient expressed understanding of PT recommendation and is agreeable to SNF placement at time of discharge. Patient reports preference for a SNF closer to her home address and that will take her insurance. CSW presented SNF bed offers to Ms. [address] including [address], [place], and [facility]. CSW discussed insurance authorization process and provided Medicare SNF ratings list. Patient has not received the COVID vaccines. Patient expressed being hopeful for rehab and to feel better soon. No further questions reported at this time. Pending patient making a choice for SNF. Ms. Heyboer reported that she will discuss the bed offers with her sister to make the best choice.  Skilled Nursing Rehab Facilities-   [email or web address] Ratings out of 5 possible    Name Address  Phone # Quality Care Staffing Health Inspection Overall  [facility] [address], [place] [phone] 5 1 4 4   [facility]  [address], [place] [phone] 3 1 5 4   [facility] [address] [place], [address] [place] 3 1 1 1   [facility] [address] 2 2 4 4   [facility] [address] [phone] 3 1 2 1   [facility] [phone] [address] [phone]  3 2 4 4   [facility] [address], [address] [place] 5 1 2 2   [facility] [address] [facility] [place] 5 2 2 3   [facility] [address], [facility] 5 1 2 2   [facility] [address], [place] [phone] 5 1 2 2   [facility] [address] [facility] [phone] 5 1 2 2   [phone] [address] . [facility] Gin[address] Otto 3 1 1 1     CSW will continue to follow through discharge.  Expected Discharge Plan: Skilled Nursing Facility Barriers to Discharge: Continued Medical Work up   Patient Goals and CMS Choice Patient states their goals for this hospitalization and ongoing recovery are:: Get up and walk/to be able to care for self CMS Medicare.gov Compare Post Acute Care list provided to:: Patient Choice offered to / list presented to : Patient  Expected Discharge Plan and Services Expected Discharge Plan: Skilled Nursing Facility In-house Referral: Clinical Social Work Discharge Planning Services: CM Consult   Living arrangements for the past 2 months: Apartment                                      Prior Living Arrangements/Services Living arrangements for the past 2 months: Apartment Lives with:: Self Patient language and need for interpreter reviewed:: Yes Do you feel safe going back to the place where you live?: Yes  Need for Family Participation in Patient Care: No (Comment) Care giver support system in place?: No (comment)   Criminal Activity/Legal Involvement Pertinent to Current Situation/Hospitalization: No - Comment as needed  Activities of Daily Living Home Assistive Devices/Equipment: None ADL Screening (condition at time of admission) Patient's cognitive ability adequate to safely complete daily activities?: Yes Is the patient deaf or have difficulty hearing?: No Does the patient have difficulty seeing, even when wearing glasses/contacts?:  No Does the patient have difficulty concentrating, remembering, or making decisions?: No Patient able to express need for assistance with ADLs?: Yes Does the patient have difficulty dressing or bathing?: Yes Independently performs ADLs?: No Does the patient have difficulty walking or climbing stairs?: Yes Weakness of Legs: Right Weakness of Arms/Hands: None  Permission Sought/Granted Permission sought to share information with : Case Manager, Magazine features editor Permission granted to share information with : Yes, Verbal Permission Granted  Share Information with NAME: Victorino Dike  Permission granted to share info w AGENCY: SNF's  Permission granted to share info w Relationship: sister  Permission granted to share info w Contact Information: [phone]  Emotional Assessment Appearance:: Appears stated age Attitude/Demeanor/Rapport: Engaged Affect (typically observed): Pleasant Orientation: : Oriented to Self, Oriented to Place, Oriented to  Time, Oriented to Situation Alcohol / Substance Use: Alcohol Use Psych Involvement: No (comment)  Admission diagnosis:  Pelvic fracture (HCC) [S32.9XXA] Closed nondisplaced fracture of pelvis, unspecified part of pelvis, initial encounter (HCC) [S32.9XXA] Patient Active Problem List   Diagnosis Date Noted   Pelvic fracture (HCC) 11/11/2020   Fall at home, initial encounter 11/11/2020   Hyponatremia 11/11/2020   Thrombocytopenia (HCC) 11/11/2020   Snoring 09/10/2019   ICD (implantable cardioverter-defibrillator) in place 11/04/2018   Chronic systolic (congestive) heart failure (HCC) 11/02/2018   Non-ischemic cardiomyopathy (HCC) 10/03/2017   Normal coronary arteries 10/03/2017   Acute systolic heart failure (HCC)    Left bundle branch block 09/19/2017   DOE (dyspnea on exertion) 09/19/2017   Hypertension 09/19/2017   Alcohol abuse 09/19/2017   PCP:  Deatra James, MD Pharmacy:   [facility] [address] [place] - [address] [place] [address] Phone: [phone] Fax: [phone]     Social Determinants of Health (SDOH) Interventions Food Insecurity Interventions: Intervention Not Indicated Financial Strain Interventions: Intervention Not Indicated Housing Interventions: Intervention Not Indicated Transportation Interventions: Intervention Not Indicated  Readmission Risk Interventions No flowsheet data found.  Benoit Meech, MSW, LCSWA [phone] Heart Failure Social Worker

## 2020-11-12 NOTE — Consult Note (Addendum)
Advanced Heart Failure Team Consult Note   Primary Physician: Deatra James, MD PCP-Cardiologist:  Thurmon Fair, MD [facility]: Dr. [name]   Reason for Consultation: evaluation of HFrEF>>HFimEF and presyncope  HPI:    Beverly Andrews is seen today for evaluation of HFrEF>>HFimEF and presyncope at the request of Dr. Margo Aye, Internal Medicine.   Beverly Andrews is a 65 y.o. female with chronic systolic CHF, NICM, LBBB and OSA.   Seen in hospital 09/18/17 with DOE and new LBBB. Echo with new systolic CHF, EF 82-50%. Cath with normal coronaries. cMRI 10/24/17 with EF 20-25%, Normal RV, normal atria. Findings suspicious for non-compaction cardiomyopathy.   Underwent MDT CRT-D in 9/20    Had Covid in 12/20 but was very mild.   Repeat Echo 04/2019 showed improved LVEF 55%. RV normal   Presented to ED on 10/4 w/ hip pain after fall at home. Had just used the bathroom and was getting up from toilet, when she had presyncopal symptoms and fell. Imaging showed left superior and inferior pubic rami fracture. Head CT negative. Ortho has evaluated and recommending non operative management. PT/OT evaluated and recommending SNF. We are asked to see for HF evaluation.   She has not been seen in the [facility] since 07/2019. Was doing fairly well up until May, when she retired. Reports change in functional capacity. Tires out more easily w/ less activity, NYHA Class II>>early III.   ICD interrogated today, findings discussed w/ MDT rep:  no VT/VF. No AT/AF. Optivol good. Index below threshold. Impedence up. 98% pacing. Activity level decline since 6/22 as outlined above.     Cardiac studies:  Echo 04/2019 EF 55%, RV normal  ECHo 03/26/2018 EF 25-30%   Echo 09/19/17 LVEF 20-25%, LBBB, Mod central MR.    cMRI 10/24/17 with EF 20-25%, Normal RV, normal atria. Findings suspicious for non-compaction cardiomyopathy.    CPX 3/20 FVC 3.55 (101%)      FEV1 2.49 (91%)         Resting HR: 70 Peak HR: 150   (94% age  predicted max HR)  BP rest: 104/70 BP peak: 148/60  Peak VO2: 22.1 (103% predicted peak VO2)  VE/VCO2 slope:  36  OUES: 1.62  Peak RER: 1.06  O2pulse:  11   (110% predicted O2pulse)   R/LHC 09/20/17 - Normal coronary arteries Hemodynamics (mmHg) RA mean 2 RV 31/0 PA 30/11 PCWP 11 AO 111/70 Cardiac Output (Fick) 5.72 Cardiac Index (Fick) 3.28  Review of Systems: [y] = yes, [ ]  = no   General: Weight gain [ ] ; Weight loss [ ] ; Anorexia [ ] ; Fatigue [ Y]; Fever [ ] ; Chills [ ] ; Weakness [ ]   Cardiac: Chest pain/pressure [ ] ; Resting SOB [ ] ; Exertional SOB [Y ]; Orthopnea [ ] ; Pedal Edema [ ] ; Palpitations [ ] ; Syncope [ ] ; Presyncope [Y]; Paroxysmal nocturnal dyspnea[ ]   Pulmonary: Cough [ ] ; Wheezing[ ] ; Hemoptysis[ ] ; Sputum [ ] ; Snoring [ ]   GI: Vomiting[ ] ; Dysphagia[ ] ; Melena[ ] ; Hematochezia [ ] ; Heartburn[ ] ; Abdominal pain [ ] ; Constipation [ ] ; Diarrhea [ Y]; BRBPR [ ]   GU: Hematuria[ ] ; Dysuria [ ] ; Nocturia[ ]   Vascular: Pain in legs with walking [ Y]; Pain in feet with lying flat [ ] ; Non-healing sores [ ] ; Stroke [ ] ; TIA [ ] ; Slurred speech [ ] ;  Neuro: Headaches[ ] ; Vertigo[ ] ; Seizures[ ] ; Paresthesias[ ] ;Blurred vision [ ] ; Diplopia [ ] ; Vision changes [ ]   Ortho/Skin: Arthritis [ ] ; Joint pain [  Y ]; Muscle pain [ ] ; Joint swelling [ ] ; Back Pain [ ] ; Rash [ ]   Psych: Depression[ ] ; Anxiety[ ]   Heme: Bleeding problems [ ] ; Clotting disorders [ ] ; Anemia [ ]   Endocrine: Diabetes [ ] ; Thyroid dysfunction[ ]   Home Medications Prior to Admission medications   Medication Sig Start Date End Date Taking? Authorizing Provider  acetaminophen (TYLENOL) 500 MG tablet Take 1,000 mg by mouth every 6 (six) hours as needed for moderate pain or headache.   Yes [provider]  albuterol (PROVENTIL HFA;VENTOLIN HFA) 108 (90 Base) MCG/ACT inhaler Inhale 1-2 puffs into the lungs every 6 (six) hours as needed for wheezing or shortness of breath.   Yes [provider]   allopurinol (ZYLOPRIM) 100 MG tablet Take 100 mg by mouth daily. 10/08/20  Yes [provider]  BIOTIN PO Take 500 mcg by mouth daily.   Yes [provider]  carvedilol (COREG) 3.125 MG tablet TAKE 1 TABLET BY MOUTH TWICE DAILY WITH A MEAL Patient taking differently: Take 3.125 mg by mouth 2 (two) times daily with a meal. 10/23/19  Yes Massimiliano Rohleder, , MD  diphenhydrAMINE (BENADRYL) 25 MG tablet Take 75 mg by mouth at bedtime as needed for sleep.    Yes [provider]  furosemide (LASIX) 20 MG tablet Take 20 mg by mouth daily as needed for fluid or edema (TAKE  AN  ADDITIONAL  TABLET  IF  WEIGHT  INCREASES  BY  2  TO  3  POUNDS.).   Yes [provider]  ibuprofen (ADVIL) 200 MG tablet Take 800 mg by mouth every 6 (six) hours as needed for headache or moderate pain.   Yes [provider]  potassium chloride SA (KLOR-CON) 20 MEQ tablet Take 1 tablet (20 mEq total) by mouth daily. Please make overdue appt for future refills Thank you 2nd attempt Patient taking differently: Take 20 mEq by mouth daily. 10/16/20  Yes Camnitz, Will , MD  sacubitril-valsartan (ENTRESTO) 24-26 MG Take 1 tablet by mouth 2 (two) times daily. 03/11/20  Yes Camnitz, Will , MD  spironolactone (ALDACTONE) 25 MG tablet Take 1 tablet (25 mg total) by mouth daily. Absolute last refill without office visit-please call [phone] Patient taking differently: Take 25 mg by mouth daily. 11/04/20  Yes Wyatte Dames, 10/25/19, MD  Multiple Vitamin (MULTIVITAMIN WITH MINERALS) TABS tablet Take 1 tablet by mouth daily. Patient not taking: Reported on 11/11/2020 09/26/17   Daphine Deutscher, NP    Past Medical History: Past Medical History:  Diagnosis Date   Alcohol abuse    Gout    History of cardiac cath 09/24/2017   09/24/17: normal coronary arteries, EF <25%   Hypertension    Non-ischemic cardiomyopathy (HCC) 09/18/2017   09/18/17- EF 20-25%   Recurrent pneumonia     Past Surgical  History: Past Surgical History:  Procedure Laterality Date   BIV ICD INSERTION CRT-D N/A 11/02/2018   Procedure: BIV ICD INSERTION CRT-D;  Surgeon: 01/11/2021, MD;  Location: [facility] INVASIVE CV LAB;  Service: Cardiovascular;  Laterality: N/A;   KNEE ARTHROSCOPY     RIGHT/LEFT HEART CATH AND CORONARY ANGIOGRAPHY N/A 09/20/2017   Procedure: RIGHT/LEFT HEART CATH AND CORONARY ANGIOGRAPHY;  Surgeon: 09/26/2017, MD;  Location: [facility] INVASIVE CV LAB;  Service: Cardiovascular;  Laterality: N/A;   TONSILLECTOMY      Family History: Family History  Problem Relation Age of Onset   AAA (abdominal aortic aneurysm) Father    Aortic  stenosis Father    Prostate cancer Father     Social History: Social History   Socioeconomic History   Marital status: Divorced    Spouse name: Not on file   Number of children: Not on file   Years of education: Not on file   Highest education level: Not on file  Occupational History   Occupation: Works at an [employer] store  Tobacco Use   Smoking status: Former   Smokeless tobacco: Never   Tobacco comments:    quit 6 months ago  Vaping Use   Vaping Use: Never used  Substance and Sexual Activity   Alcohol use: Yes    Alcohol/week: 21.0 standard drinks    Types: 21 Standard drinks or equivalent per week    Comment: 3-4 cocktails a day   Drug use: Never   Sexual activity: Not on file  Other Topics Concern   Not on file  Social History Narrative   Not on file   Social Determinants of Health   Financial Resource Strain: Low Risk    Difficulty of Paying Living Expenses: Not very hard  Food Insecurity: No Food Insecurity   Worried About Running Out of Food in the Last Year: Never true   Ran Out of Food in the Last Year: Never true  Transportation Needs: No Transportation Needs   Lack of Transportation (Medical): No   Lack of Transportation (Non-Medical): No  Physical Activity: Not on file  Stress: Not on file  Social Connections: Not on file     Allergies:  Allergies  Allergen Reactions   Amoxicillin Itching and Swelling   Carbocaine [Mepivacaine] Hives and Swelling   Penicillins Itching and Swelling    Did it involve swelling of the face/tongue/throat, SOB, or low BP? Yes Did it involve sudden or severe rash/hives, skin peeling, or any reaction on the inside of your mouth or nose? Yes Did you need to seek medical attention at a hospital or doctor's office? No When did it last happen?      10 + years If all above answers are "NO", may proceed with cephalosporin use.     Shellfish Allergy Swelling    Objective:    Vital Signs:   Temp:  [97.9 F (36.6 C)-98.6 F (37 C)] 98.1 F (36.7 C) (10/06 1211) Pulse Rate:  [76-94] 89 (10/06 1211) Resp:  [11-26] 18 (10/06 1211) BP: (95-129)/(64-89) 113/69 (10/06 1211) SpO2:  [93 %-100 %] 97 % (10/06 1211) Last BM Date: 11/10/20  Weight change: Filed Weights   11/10/20 1849  Weight: 72.1 kg    Intake/Output:   Intake/Output Summary (Last 24 hours) at 11/12/2020 1310 Last data filed at 11/11/2020 2200 Gross per 24 hour  Intake --  Output 1100 ml  Net -1100 ml      Physical Exam    General:  Well appearing. No resp difficulty HEENT: normal Neck: supple. No  JVP . Carotids 2+ bilat; no bruits. No lymphadenopathy or thyromegaly appreciated. Cor: PMI nondisplaced. Regular rate & rhythm. No rubs, gallops or murmurs. Lungs: clear Abdomen: soft, nontender, nondistended. No hepatosplenomegaly. No bruits or masses. Good bowel sounds. Extremities: no cyanosis, clubbing, rash, edema Neuro: alert & orientedx3, cranial nerves grossly intact. moves all 4 extremities w/o difficulty. Affect pleasant   Telemetry   N/A   EKG    Atrial-sensed ventricular-paced rhythm, 79 bpm   Labs   Basic Metabolic Panel: Recent Labs  Lab 11/10/20 1858 11/11/20 0614  NA 129* 132*  K 4.9 4.3  CL 98 103  CO2 21* 18*  GLUCOSE 125* 96  BUN 17 16  CREATININE 1.32* 1.17*  CALCIUM  9.4 9.1    Liver Function Tests: No results for input(s): AST, ALT, ALKPHOS, BILITOT, PROT, ALBUMIN in the last 168 hours. No results for input(s): LIPASE, AMYLASE in the last 168 hours. No results for input(s): AMMONIA in the last 168 hours.  CBC: Recent Labs  Lab 11/10/20 1858 11/11/20 0614  WBC 10.0 8.1  NEUTROABS 8.2*  --   HGB 12.5 13.1  HCT 33.7* 36.3  MCV 114.6* 119.4*  PLT 131* 135*    Cardiac Enzymes: No results for input(s): CKTOTAL, CKMB, CKMBINDEX, TROPONINI in the last 168 hours.  BNP: BNP (last 3 results) No results for input(s): BNP in the last 8760 hours.  ProBNP (last 3 results) No results for input(s): PROBNP in the last 8760 hours.   CBG: No results for input(s): GLUCAP in the last 168 hours.  Coagulation Studies: No results for input(s): LABPROT, INR in the last 72 hours.   Imaging   No results found.   Medications:     Current Medications:  allopurinol  100 mg Oral Daily   folic acid  1 mg Oral Daily   heparin  5,000 Units Subcutaneous Q8H   multivitamin with minerals  1 tablet Oral Daily   senna-docusate  2 tablet Oral Daily   thiamine  100 mg Oral Daily   Or   thiamine  100 mg Intravenous Daily   traMADol  50 mg Oral Q12H    Infusions:  lactated ringers 30 mL/hr at 11/11/20 1600      Patient Profile   65 y/o female w/ h/o NICM w/ prior EF 20-25% w/ concern for noncompation, LBBB s/p CRT-D w/ improved EF up to 55% on last echo, admitted for fall resulting in pelvic fx that is being managed nonoperatively. Had presyncopal symptoms prior to fall. AHF team asked to evaluate.    Assessment/Plan   1. Left Sup/Inf Rami Fxs - 2/2 fall - evaluated by Ortho, recommending non operative management - will need SNF  2. Presyncope - caused fall - given h/o NICM w/ ? Noncompaction, I had her device interrogated. This showed no associated arrhythmia. No detection of AF/AT. No VF/VT   - repeat echo   3. 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 LHC w/ normal cors. cMRI EF 20-25%, no LGE. Ratio of non-compacted to compacted myocardium of > 2.5 in the apical segment concerning for NCCM. Repeat echo 03/2018 w/ unimproved EF despite medical therapy at 20-25%. W/ LBBB, underwent CRT-D 9/20 w/ improved EF. Echo 3/21 w/ normalized EF 55%  - Recent change in functional status w/ progression from NYHA Class II>>early III - Volume good on exam and by device interrogation. Index down/ impedence up on Optivol . 98% paced  - No VT/VF  - repeat echo  - Home meds held on admit given soft BP and dizziness. Meds PTA: Coreg 3.125 bid, Entresto 24-26 mg bid, Spiro 25 mg daily, lasix 20 mg PRN. BP remains soft upper 90s-113s systolic off meds. SCr 1.2, K 4.3 - slowly add back meds, trial spiro 12.5 daily first - cautious w/ restart of Entresto   4. H/o ETOH Abuse  -CIWA protocol per IM   Length of Stay: 0  Brittainy Simmons, PA-C  11/12/2020, 1:10 PM  Advanced Heart Failure Team Pager [phone] (M-F; 7a - 5p)  Please contact [facility] for night-coverage after hours (4p -  7a ) and weekends on [email or web address]  Patient seen and examined with the above-signed Advanced Practice Provider and/or Housestaff. I personally reviewed laboratory data, imaging studies and relevant notes. I independently examined the patient and formulated the important aspects of the plan. I have edited the note to reflect any of my changes or salient points. I have personally discussed the plan with the patient and/or family.  65 y/o woman with HF with recovered EF and h/o ETOH abuse admitted for pelvic fracture after a fall when getting up from going to the bathroom. Had vagal-type symptoms prior to event.   No CP, SOB, orthopnea or PND. + fatigue  ICD interrogated No VT/AF Personally reviewed  General:  Lying in bed  No resp difficulty HEENT: normal Neck: supple. no JVD. Carotids 2+ bilat; no bruits. No lymphadenopathy or thryomegaly  appreciated. Cor: PMI nondisplaced. Regular rate & rhythm. No rubs, gallops or murmurs. Lungs: clear Abdomen: soft, nontender, nondistended. No hepatosplenomegaly. No bruits or masses. Good bowel sounds. Extremities: no cyanosis, clubbing, rash, edema Neuro: alert & orientedx3, cranial nerves grossly intact. moves all 4 extremities w/o difficulty. Affect pleasant  Suspect fall likely related to vagal episode. ICD interrogated personally and no events. Volume status looks good. She is due for repeat echo. Will order. Restart HF meds as tolerated.   Arvilla Meres, MD  3:11 PM

## 2020-11-12 NOTE — Progress Notes (Signed)
PROGRESS NOTE  Beverly Andrews PRF:[id number] DOB: [birth year] DOA: 11/10/2020 PCP: Beverly James, MD  HPI/Recap of past 24 hours:  Beverly Andrews is a 65 y.o. female with medical history significant of alcohol abuse, gout, hypertension, nonischemic cardiomyopathy status post [employer] CRT-D, LBBB, presented to [facility] ED complaining of left hip pain after having 2 falls at home.  Work-up revealed pelvic CT showing left superior and inferior pubic rami fractures without significant displacement.  Seen by orthopedic surgery, plan for nonoperative management.  She was evaluated by PT OT with recommendation for SNF.  TOC assisting with DC planning and SNF placement.  11/12/2020: Patient reports having severe pain with ambulation.  She is on nonoperative management, weightbearing as tolerated.  She will need to follow-up with orthopedic surgery in 3 weeks to repeat x-rays.   Assessment/Plan: Principal Problem:   Pelvic fracture [facility]) Active Problems:   Non-ischemic cardiomyopathy (HCC)   Fall at home, initial encounter   Hyponatremia   Thrombocytopenia (HCC)  Pelvic fracture, post mechanical fall. CT scan showing left superior and inferior pubic rami fracture without significant displacement Pain management Continue PT OT with assistance and fall precaution  Near syncope suspect vasovagal while she was on the commode. CT head negative for any acute intracranial findings. Continue fall precautions  HFrEF 20 to 25% Cardiology consulted at patient's request Management per cardiology Strict I's and O's and daily weight Net I&O +511 cc.  Alcohol use disorder CIWA protocol   Code Status: Full code  Family Communication: None at bedside  Disposition Plan: Likely will discharge to SNF once bed is available.  Patient is medically cleared for discharge.   Consultants: Cardiology  Procedures: None  Antimicrobials: None  DVT prophylaxis: Subcu Lovenox daily  Status is:  Observation    Dispo:  Patient From: Home  Planned Disposition: Home  Medically stable for discharge: Yes       Objective: Vitals:   11/12/20 0800 11/12/20 0933 11/12/20 1211 11/12/20 1644  BP: 103/64 98/70 113/69 103/66  Pulse: 84 92 89 91  Resp: 16 17 18 18   Temp: 98.6 F (37 C) 98.5 F (36.9 C) 98.1 F (36.7 C) 98.3 F (36.8 C)  TempSrc: Oral Oral Oral Oral  SpO2:  95% 97% 96%  Weight:      Height:        Intake/Output Summary (Last 24 hours) at 11/12/2020 1652 Last data filed at 11/12/2020 1500 Gross per 24 hour  Intake 611.02 ml  Output 1100 ml  Net -488.98 ml   Filed Weights   11/10/20 1849  Weight: 72.1 kg    Exam:  General: 65 y.o. year-old female well developed well nourished in no acute distress.  Alert and oriented x3. Cardiovascular: Regular rate and rhythm with no rubs or gallops.  No thyromegaly or JVD noted.   Respiratory: Clear to auscultation with no wheezes or rales. Good inspiratory effort. Abdomen: Soft nontender nondistended with normal bowel sounds x4 quadrants. Musculoskeletal: No lower extremity edema. 2/4 pulses in all 4 extremities. Skin: No ulcerative lesions noted or rashes, Psychiatry: Mood is appropriate for condition and setting   Data Reviewed: CBC: Recent Labs  Lab 11/10/20 1858 11/11/20 0614  WBC 10.0 8.1  NEUTROABS 8.2*  --   HGB 12.5 13.1  HCT 33.7* 36.3  MCV 114.6* 119.4*  PLT 131* 135*   Basic Metabolic Panel: Recent Labs  Lab 11/10/20 1858 11/11/20 0614  NA 129* 132*  K 4.9 4.3  CL 98 103  CO2 21* 18*  GLUCOSE 125* 96  BUN 17 16  CREATININE 1.32* 1.17*  CALCIUM 9.4 9.1   GFR: Estimated Creatinine Clearance: 47.2 mL/min (A) (by C-G formula based on SCr of 1.17 mg/dL (H)). Liver Function Tests: No results for input(s): AST, ALT, ALKPHOS, BILITOT, PROT, ALBUMIN in the last 168 hours. No results for input(s): LIPASE, AMYLASE in the last 168 hours. No results for input(s): AMMONIA in the last 168  hours. Coagulation Profile: No results for input(s): INR, PROTIME in the last 168 hours. Cardiac Enzymes: No results for input(s): CKTOTAL, CKMB, CKMBINDEX, TROPONINI in the last 168 hours. BNP (last 3 results) No results for input(s): PROBNP in the last 8760 hours. HbA1C: No results for input(s): HGBA1C in the last 72 hours. CBG: No results for input(s): GLUCAP in the last 168 hours. Lipid Profile: No results for input(s): CHOL, HDL, LDLCALC, TRIG, CHOLHDL, LDLDIRECT in the last 72 hours. Thyroid Function Tests: No results for input(s): TSH, T4TOTAL, FREET4, T3FREE, THYROIDAB in the last 72 hours. Anemia Panel: No results for input(s): VITAMINB12, FOLATE, FERRITIN, TIBC, IRON, RETICCTPCT in the last 72 hours. Urine analysis: No results found for: COLORURINE, APPEARANCEUR, LABSPEC, PHURINE, GLUCOSEU, HGBUR, BILIRUBINUR, KETONESUR, PROTEINUR, UROBILINOGEN, NITRITE, LEUKOCYTESUR Sepsis Labs: @LABRCNTIP (procalcitonin:4,lacticidven:4)  ) Recent Results (from the past 240 hour(s))  Resp Panel by RT-PCR (Flu A&B, Covid) Nasopharyngeal Swab     Status: None   Collection Time: 11/11/20  2:44 AM   Specimen: Nasopharyngeal Swab; Nasopharyngeal(NP) swabs in vial transport medium  Result Value Ref Range Status   SARS Coronavirus 2 by RT PCR NEGATIVE NEGATIVE Final    Comment: (NOTE) SARS-CoV-2 target nucleic acids are NOT DETECTED.  The SARS-CoV-2 RNA is generally detectable in upper respiratory specimens during the acute phase of infection. The lowest concentration of SARS-CoV-2 viral copies this assay can detect is 138 copies/mL. A negative result does not preclude SARS-Cov-2 infection and should not be used as the sole basis for treatment or other patient management decisions. A negative result may occur with  improper specimen collection/handling, submission of specimen other than nasopharyngeal swab, presence of viral mutation(s) within the areas targeted by this assay, and  inadequate number of viral copies(<138 copies/mL). A negative result must be combined with clinical observations, patient history, and epidemiological information. The expected result is Negative.  Fact Sheet for Patients:  01/11/21  Fact Sheet for Healthcare Providers:  BloggerCourse.com  This test is no t yet approved or cleared by the [employer].[place] FDA and  has been authorized for detection and/or diagnosis of SARS-CoV-2 by FDA under an Emergency Use Authorization (EUA). This EUA will remain  in effect (meaning this test can be used) for the duration of the COVID-19 declaration under Section 564(b)(1) of the Act, 21 U.S.C.section 360bbb-3(b)(1), unless the authorization is terminated  or revoked sooner.       Influenza A by PCR NEGATIVE NEGATIVE Final   Influenza B by PCR NEGATIVE NEGATIVE Final    Comment: (NOTE) The Xpert Xpress SARS-CoV-2/FLU/RSV plus assay is intended as an aid in the diagnosis of influenza from Nasopharyngeal swab specimens and should not be used as a sole basis for treatment. Nasal washings and aspirates are unacceptable for Xpert Xpress SARS-CoV-2/FLU/RSV testing.  Fact Sheet for Patients: [place]  Fact Sheet for Healthcare Providers: BloggerCourse.com  This test is not yet approved or cleared by the [employer]ious[employer]ker.[place] FDA and has been authorized for detection and/or diagnosis of SARS-CoV-2 by FDA under an Emergency Use Authorization (EUA). This EUA will remain in effect (  meaning this test can be used) for the duration of the COVID-19 declaration under Section 564(b)(1) of the Act, 21 U.S.C. section 360bbb-3(b)(1), unless the authorization is terminated or revoked.  Performed at [facility], [address] [place], [place] [address]       Studies: No results found.  Scheduled Meds:  allopurinol  100 mg Oral Daily    folic acid  1 mg Oral Daily   heparin  5,000 Units Subcutaneous Q8H   multivitamin with minerals  1 tablet Oral Daily   senna-docusate  2 tablet Oral Daily   thiamine  100 mg Oral Daily   Or   thiamine  100 mg Intravenous Daily   traMADol  50 mg Oral Q12H    Continuous Infusions:   LOS: 0 days     Darlin Drop, MD [facility] Pager [phone]  If 7PM-7AM, please contact night-coverage [email or web address] Password [email or web address] 11/12/2020, 4:52 PM

## 2020-11-13 ENCOUNTER — Encounter (HOSPITAL_COMMUNITY): Payer: Self-pay | Admitting: Internal Medicine

## 2020-11-13 ENCOUNTER — Observation Stay (HOSPITAL_BASED_OUTPATIENT_CLINIC_OR_DEPARTMENT_OTHER): Payer: BC Managed Care – PPO

## 2020-11-13 ENCOUNTER — Other Ambulatory Visit (HOSPITAL_COMMUNITY): Payer: BC Managed Care – PPO

## 2020-11-13 DIAGNOSIS — S329XXA Fracture of unspecified parts of lumbosacral spine and pelvis, initial encounter for closed fracture: Secondary | ICD-10-CM | POA: Diagnosis not present

## 2020-11-13 DIAGNOSIS — I5031 Acute diastolic (congestive) heart failure: Secondary | ICD-10-CM | POA: Diagnosis not present

## 2020-11-13 DIAGNOSIS — I5022 Chronic systolic (congestive) heart failure: Secondary | ICD-10-CM | POA: Diagnosis not present

## 2020-11-13 LAB — ECHOCARDIOGRAM COMPLETE
Area-P 1/2: 3.34 cm2
Height: 67 in
S' Lateral: 3.3 cm
Weight: 2747.81 oz

## 2020-11-13 LAB — RESP PANEL BY RT-PCR (FLU A&B, COVID) ARPGX2
Influenza A by PCR: NEGATIVE
Influenza B by PCR: NEGATIVE
SARS Coronavirus 2 by RT PCR: NEGATIVE

## 2020-11-13 MED ORDER — OXYCODONE-ACETAMINOPHEN 5-325 MG PO TABS: 1.0000 | ORAL_TABLET | Freq: Three times a day (TID) | ORAL | 0 refills | Status: AC | PRN

## 2020-11-13 MED ORDER — ADULT MULTIVITAMIN W/MINERALS CH: 1.0000 | ORAL_TABLET | Freq: Every day | ORAL | 0 refills | Status: AC

## 2020-11-13 MED ORDER — FOLIC ACID 1 MG PO TABS: 1.0000 mg | ORAL_TABLET | Freq: Every day | ORAL | 0 refills | Status: AC

## 2020-11-13 MED ORDER — THIAMINE HCL 100 MG PO TABS: 100.0000 mg | ORAL_TABLET | Freq: Every day | ORAL | 0 refills | Status: AC

## 2020-11-13 MED ORDER — CARVEDILOL 3.125 MG PO TABS: 3.1250 mg | ORAL_TABLET | Freq: Two times a day (BID) | ORAL | Status: DC

## 2020-11-13 NOTE — Progress Notes (Signed)
  Echocardiogram 2D Echocardiogram has been performed.  Beverly Andrews 11/13/2020, 1:28 PM

## 2020-11-13 NOTE — Progress Notes (Signed)
Occupational Therapy Treatment Patient Details Name: Beverly Andrews MRN: [id number] DOB: 1955/08/15 Today's Date: 11/13/2020   History of present illness 65 y.o. female admitted on 11/10/20 for fall, presyncope, and resultant L superior/infereior pubic rami pelvic fxs.  WBAT per ortho.  Pt with significant PMH of gout, HTN, non-ischemic cardiomyopathy, BIV ICD, knee arthroscopy, ETOH abuse.   OT comments  Pt. Seen for skilled OT treatment session.  Initial attempt pt. Requesting to be premedicated, upon return after receiving  pain meds pt. Was agreeable to participation.  Great strides with bed mobility, able to assist moving her LES to eob without assistance.  Sit/stand and pivotal steps to recliner.  Encouraged use of bsc if able.     Recommendations for follow up therapy are one component of a multi-disciplinary discharge planning process, led by the attending physician.  Recommendations may be updated based on patient status, additional functional criteria and insurance authorization.    Follow Up Recommendations  SNF    Equipment Recommendations  None recommended by OT    Recommendations for Other Services      Precautions / Restrictions Precautions Precautions: Fall Restrictions RLE Weight Bearing: Weight bearing as tolerated LLE Weight Bearing: Weight bearing as tolerated       Mobility Bed Mobility Overal bed mobility: Needs Assistance Bed Mobility: Supine to Sit     Supine to sit: Mod assist     General bed mobility comments: able to guide each LE with support of both hands moving one then the other towards eob then utilized ue support to pull on bed rail to bring bles off of bed, provided trunk support only for final sitting up portion, otherwise had pt. manage without physical assistance    Transfers Overall transfer level: Needs assistance Equipment used: Rolling walker (2 wheeled) Transfers: Sit to/from [employer] Sit to Stand: Mod  assist;From elevated surface Stand pivot transfers: Min assist       General transfer comment: Pt was able to power up wtih min assist with cues for hand placement and able to take pivotal steps around to the chair with min assist and cues.    Balance                                           ADL either performed or assessed with clinical judgement   ADL Overall ADL's : Needs assistance/impaired                         Toilet Transfer: Maximal assistance;RW Toilet Transfer Details (indicate cue type and reason): simulated with transfer eob to recliner (declined need for use of bsc and requested pure wik placement for recliner)         Functional mobility during ADLs: Maximal assistance General ADL Comments: tolerated session well.  able to navigate bed mobility with increasing independence     Vision       Perception     Praxis      Cognition Arousal/Alertness: Awake/alert Behavior During Therapy: [facility] for tasks assessed/performed;Anxious Overall Cognitive Status: Within Functional Limits for tasks assessed                                          Exercises     Shoulder Instructions  General Comments      Pertinent Vitals/ Pain       Pain Assessment: Faces Faces Pain Scale: Hurts little more Pain Location: left hip and groin Pain Descriptors / Indicators: Aching;Burning Pain Intervention(s): Limited activity within patient's tolerance;Monitored during session;Premedicated before session;Repositioned  Home Living                                          Prior Functioning/Environment              Frequency  Min 2X/week        Progress Toward Goals  OT Goals(current goals can now be found in the care plan section)  Progress towards OT goals: Progressing toward goals     Plan      Co-evaluation                 AM-PAC OT "6 Clicks" Daily Activity     Outcome  Measure   Help from another person eating meals?: A Little Help from another person taking care of personal grooming?: A Lot Help from another person toileting, which includes using toliet, bedpan, or urinal?: Total Help from another person bathing (including washing, rinsing, drying)?: A Lot Help from another person to put on and taking off regular upper body clothing?: Total Help from another person to put on and taking off regular lower body clothing?: Total 6 Click Score: 10    End of Session Equipment Utilized During Treatment: Rolling walker  OT Visit Diagnosis: Pain   Activity Tolerance Patient tolerated treatment well   Patient Left in chair;with call bell/phone within reach   Nurse Communication Other (comment) (spoke with cna, pt. request for pure wik placement)        Time: 1025-1039 OT Time Calculation (min): 14 min  Charges: OT General Charges $OT Visit: 1 Visit OT Treatments $Self Care/Home Management : 8-22 mins  Boneta Lucks, COTA/L Acute Rehabilitation [phone]   Salvadore Oxford 11/13/2020, 11:22 AM

## 2020-11-13 NOTE — Progress Notes (Addendum)
Advanced Heart Failure Rounding Note  PCP-Cardiologist: Thurmon Fair, MD   Subjective:   Fall r/t vagal episode. ICD no events. Off HF meds.   Echo today EF 60-65% Grade I DD  Having some dizziness when standing. Denies SOB. Having pain from r groin.     Objective:   Weight Range: 77.9 kg Body mass index is 26.9 kg/m.   Vital Signs:   Temp:  [98.1 F (36.7 C)-99.5 F (37.5 C)] 99.5 F (37.5 C) (10/07 0500) Pulse Rate:  [86-91] 90 (10/07 0930) Resp:  [17-18] 17 (10/07 0930) BP: (103-123)/(66-74) 115/71 (10/07 0930) SpO2:  [96 %-98 %] 98 % (10/07 0930) Weight:  [77.9 kg] 77.9 kg (10/07 0500) Last BM Date: 11/10/20  Weight change: Filed Weights   11/10/20 1849 11/13/20 0500  Weight: 72.1 kg 77.9 kg    Intake/Output:   Intake/Output Summary (Last 24 hours) at 11/13/2020 1110 Last data filed at 11/13/2020 1056 Gross per 24 hour  Intake 1237.92 ml  Output 1200 ml  Net 37.92 ml      Physical Exam    General:   No resp difficulty HEENT: Normal Neck: Supple. JVP flat  Carotids 2+ bilat; no bruits. No lymphadenopathy or thyromegaly appreciated. Cor: PMI nondisplaced. Regular rate & rhythm. No rubs, gallops or murmurs. Lungs: Clear Abdomen: Soft, nontender, nondistended. No hepatosplenomegaly. No bruits or masses. Good bowel sounds. Extremities: No cyanosis, clubbing, rash, edema Neuro: Alert & orientedx3, cranial nerves grossly intact. moves all 4 extremities w/o difficulty. Affect pleasant   Telemetry   SR 80-90s   EKG    N/A  Labs    CBC Recent Labs    11/10/20 1858 11/11/20 0614  WBC 10.0 8.1  NEUTROABS 8.2*  --   HGB 12.5 13.1  HCT 33.7* 36.3  MCV 114.6* 119.4*  PLT 131* 135*   Basic Metabolic Panel Recent Labs    17/61/60 1858 11/11/20 0614  NA 129* 132*  K 4.9 4.3  CL 98 103  CO2 21* 18*  GLUCOSE 125* 96  BUN 17 16  CREATININE 1.32* 1.17*  CALCIUM 9.4 9.1   Liver Function Tests No results for input(s): AST, ALT,  ALKPHOS, BILITOT, PROT, ALBUMIN in the last 72 hours. No results for input(s): LIPASE, AMYLASE in the last 72 hours. Cardiac Enzymes No results for input(s): CKTOTAL, CKMB, CKMBINDEX, TROPONINI in the last 72 hours.  BNP: BNP (last 3 results) No results for input(s): BNP in the last 8760 hours.  ProBNP (last 3 results) No results for input(s): PROBNP in the last 8760 hours.   D-Dimer No results for input(s): DDIMER in the last 72 hours. Hemoglobin A1C No results for input(s): HGBA1C in the last 72 hours. Fasting Lipid Panel No results for input(s): CHOL, HDL, LDLCALC, TRIG, CHOLHDL, LDLDIRECT in the last 72 hours. Thyroid Function Tests No results for input(s): TSH, T4TOTAL, T3FREE, THYROIDAB in the last 72 hours.  Invalid input(s): FREET3  Other results:   Imaging    No results found.   Medications:     Scheduled Medications:  allopurinol  100 mg Oral Daily   enoxaparin (LOVENOX) injection  40 mg Subcutaneous Q24H   folic acid  1 mg Oral Daily   multivitamin with minerals  1 tablet Oral Daily   senna-docusate  2 tablet Oral Daily   thiamine  100 mg Oral Daily   Or   thiamine  100 mg Intravenous Daily   traMADol  50 mg Oral Q12H    Infusions:  PRN Medications: acetaminophen **OR** acetaminophen, LORazepam **OR** LORazepam, morphine injection, oxyCODONE-acetaminophen  Assessment/Plan  1. Left Sup/Inf Rami Fxs - 2/2 fall - evaluated by Ortho, recommending non operative management - will need SNF   2. Presyncope - caused fall - given h/o NICM w/ ? Noncompaction, I had her device interrogated. This showed no associated arrhythmia. No detection of AF/AT. No VF/VT   - Echo EF 60-65%.      3. 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 LHC w/ normal cors. cMRI EF 20-25%, no LGE. Ratio of non-compacted to compacted myocardium of > 2.5 in the apical segment concerning for NCCM. Repeat echo 03/2018 w/ unimproved EF despite medical therapy at 20-25%.  W/ LBBB, underwent CRT-D 9/20 w/ improved EF. Echo 3/21 w/ normalized EF 55% . Today EF EF 60-65%.  - [employer] Interrogation-  Index down/ impedence up on Optivol . 98% paced. No VT/VF  - BP has been soft.  - Given dizziness would hold entresto and spiro.  - Renal function stable.    4. H/o ETOH Abuse  -CIWA protocol per IM   Ok for d/c to rehab.   TOC following for SNF.  Length of Stay: 0  Tonye Becket, NP  11/13/2020, 11:10 AM  Advanced Heart Failure Team Pager [phone] (M-F; 7a - 5p)  Please contact [facility] for night-coverage after hours (5p -7a ) and weekends on [email or web address]  Patient seen and examined with the above-signed Advanced Practice Provider and/or Housestaff. I personally reviewed laboratory data, imaging studies and relevant notes. I independently examined the patient and formulated the important aspects of the plan. I have edited the note to reflect any of my changes or salient points. I have personally discussed the plan with the patient and/or family.  Stable from cardiac perspective. No CP or SOB.   Echo today reviewed personally. EF is normal   General:  Well appearing. No resp difficulty HEENT: normal Neck: supple. no JVD. Carotids 2+ bilat; no bruits. No lymphadenopathy or thryomegaly appreciated. Cor: PMI nondisplaced. Regular rate & rhythm. No rubs, gallops or murmurs. Lungs: clear Abdomen: soft, nontender, nondistended. No hepatosplenomegaly. No bruits or masses. Good bowel sounds. Extremities: no cyanosis, clubbing, rash, edema Neuro: alert & orientedx3, cranial nerves grossly intact. moves all 4 extremities w/o difficulty. Affect pleasant   She is stable from HF perspective. EF looks good. Would attempt to restart Entresto 24/26 BID at d/c as long as SBP > 110 consistently.   HF team will sign off.  Arvilla Meres, MD  5:15 PM

## 2020-11-13 NOTE — Discharge Summary (Signed)
Discharge Summary  Beverly Andrews WUJ:[id number] DOB: 1955-03-03  PCP: Deatra James, MD  Admit date: 11/10/2020 Discharge date: 11/13/2020  Time spent: 35 minutes.  Recommendations for Outpatient Follow-up:  Follow-up with orthopedic surgery Follow-up with cardiology  Discharge Diagnoses:  Active Hospital Problems   Diagnosis Date Noted   Pelvic fracture (HCC) 11/11/2020   Fall at home, initial encounter 11/11/2020   Hyponatremia 11/11/2020   Thrombocytopenia (HCC) 11/11/2020   Non-ischemic cardiomyopathy (HCC) 10/03/2017    Resolved Hospital Problems  No resolved problems to display.    Discharge Condition: Stable  Diet recommendation: Heart healthy diet.  Vitals:   11/13/20 0500 11/13/20 0930  BP: 105/72 115/71  Pulse: 86 90  Resp: 18 17  Temp: 99.5 F (37.5 C)   SpO2: 97% 98%    History of present illness:   Beverly Andrews is a 65 y.o. female with medical history significant of alcohol abuse, gout, hypertension, nonischemic cardiomyopathy status post Med[employer] CRT-D, LBBB, presented to [facility] ED complaining of left hip pain after having 2 falls at home.  Work-up revealed pelvic CT showing left superior and inferior pubic rami fractures without significant displacement.  Seen by orthopedic surgery, plan for nonoperative management.  She was evaluated by PT OT with recommendation for SNF.  TOC assisting with DC planning and SNF placement.  She is on nonoperative management, weightbearing as tolerated.  She will need to follow-up with orthopedic surgery in 3 weeks to repeat x-rays.   11/13/2020: Seen at her bedside.  Mainly complains of pain when she mobilizes.  Seen by cardiology, okay to discharge to rehab.   Hospital Course:  Principal Problem:   Pelvic fracture [facility]) Active Problems:   Non-ischemic cardiomyopathy (HCC)   Fall at home, initial encounter   Hyponatremia   Thrombocytopenia (HCC)   Pelvic fracture, post mechanical fall. CT scan showing left superior  and inferior pubic rami fracture without significant displacement Seen by orthopedic surgery, recommended medical nonoperative management. Pain management Continue PT OT with assistance and fall precaution   Near syncope suspect vasovagal while she was on the commode. CT head negative for any acute intracranial findings. 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 Continue Coreg 3.125 mg twice daily She is on as needed p.o. Lasix Strict I's and O's and daily weight Net I&O -62 cc Follow-up with cardiology.   Alcohol use disorder No evidence of alcohol withdrawal at the time of this visit CIWA protocol was in place.     Code Status: Full code      Consultants: Cardiology Orthopedic surgery   Procedures: None   Antimicrobials: None     Discharge Exam: BP 115/71 (BP Location: Right Arm)   Pulse 90   Temp 99.5 F (37.5 C) (Oral)   Resp 17   Ht  (1.702 m)   Wt 77.9 kg   SpO2 98%   BMI 26.90 kg/m  General: 65 y.o. year-old female well developed well nourished in no acute distress.  Alert and oriented x3. Cardiovascular: Regular rate and rhythm with no rubs or gallops.  No thyromegaly or JVD noted.   Respiratory: Clear to auscultation with no wheezes or rales. Good inspiratory effort. Abdomen: Soft nontender nondistended with normal bowel sounds x4 quadrants. Musculoskeletal: No lower extremity edema. 2/4 pulses in all 4 extremities. Skin: No ulcerative lesions noted or rashes, Psychiatry: Mood is appropriate for condition and  setting  Discharge Instructions You were cared for by a hospitalist during your hospital stay. If you have any questions about your discharge medications or the care you received while you were in the hospital after you are discharged, you can call the unit and asked to speak with the hospitalist on call if the hospitalist  that took care of you is not available. Once you are discharged, your primary care physician will handle any further medical issues. Please note that NO REFILLS for any discharge medications will be authorized once you are discharged, as it is imperative that you return to your primary care physician (or establish a relationship with a primary care physician if you do not have one) for your aftercare needs so that they can reassess your need for medications and monitor your lab values.   Allergies as of 11/13/2020       Reactions   Amoxicillin Itching, Swelling   Carbocaine [mepivacaine] Hives, Swelling   Penicillins Itching, Swelling   Did it involve swelling of the face/tongue/throat, SOB, or low BP? Yes Did it involve sudden or severe rash/hives, skin peeling, or any reaction on the inside of your mouth or nose? Yes Did you need to seek medical attention at a hospital or doctor's office? No When did it last happen?      10 + years If all above answers are "NO", may proceed with cephalosporin use.   Shellfish Allergy Swelling        Medication List     STOP taking these medications    sacubitril-valsartan 24-26 MG Commonly known as: ENTRESTO   spironolactone 25 MG tablet Commonly known as: ALDACTONE       TAKE these medications    acetaminophen 500 MG tablet Commonly known as: TYLENOL Take 1,000 mg by mouth every 6 (six) hours as needed for moderate pain or headache.   albuterol 108 (90 Base) MCG/ACT inhaler Commonly known as: VENTOLIN HFA Inhale 1-2 puffs into the lungs every 6 (six) hours as needed for wheezing or shortness of breath.   allopurinol 100 MG tablet Commonly known as: ZYLOPRIM Take 100 mg by mouth daily.   BIOTIN PO Take 500 mcg by mouth daily.   carvedilol 3.125 MG tablet Commonly known as: COREG TAKE 1 TABLET BY MOUTH TWICE DAILY WITH A MEAL   diphenhydrAMINE 25 MG tablet Commonly known as: BENADRYL Take 75 mg by mouth at bedtime as needed for  sleep.   folic acid 1 MG tablet Commonly known as: FOLVITE Take 1 tablet (1 mg total) by mouth daily. Start taking on: November 14, 2020   furosemide 20 MG tablet Commonly known as: LASIX Take 20 mg by mouth daily as needed for fluid or edema (TAKE  AN  ADDITIONAL  TABLET  IF  WEIGHT  INCREASES  BY  2  TO  3  POUNDS.).   ibuprofen 200 MG tablet Commonly known as: ADVIL Take 800 mg by mouth every 6 (six) hours as needed for headache or moderate pain.   multivitamin with minerals Tabs tablet Take 1 tablet by mouth daily. Start taking on: November 14, 2020   oxyCODONE-acetaminophen 5-325 MG tablet Commonly known as: PERCOCET/ROXICET Take 1 tablet by mouth every 8 (eight) hours as needed for up to 3 days for moderate pain or severe pain.   potassium chloride SA 20 MEQ tablet Commonly known as: KLOR-CON Take 1 tablet (20 mEq total) by mouth daily. Please make overdue appt for future refills Thank you 2nd attempt What  changed: additional instructions   thiamine 100 MG tablet Take 1 tablet (100 mg total) by mouth daily. Start taking on: November 14, 2020       Allergies  Allergen Reactions   Amoxicillin Itching and Swelling   Carbocaine [Mepivacaine] Hives and Swelling   Penicillins Itching and Swelling    Did it involve swelling of the face/tongue/throat, SOB, or low BP? Yes Did it involve sudden or severe rash/hives, skin peeling, or any reaction on the inside of your mouth or nose? Yes Did you need to seek medical attention at a hospital or doctor's office? No When did it last happen?      10 + years If all above answers are "NO", may proceed with cephalosporin use.     Shellfish Allergy Swelling    Contact information for follow-up providers     Deatra James, MD. Call today.   Specialty: Family Medicine Why: Please call for a posthospital follow-up appointment. Contact information: [address] [place] [address] [phone]         Thurmon Fair, MD .   Specialty: Cardiology Contact information: [address] [place] [address] [phone]         Regan Lemming, MD .   Specialty: Cardiology Contact information: [address] [place] [address] [phone]         Roby Lofts, MD. Call today.   Specialty: Orthopedic Surgery Why: Please call for a post hospital follow-up appointment. Contact information: [address] [place] [address] [phone]              Contact information for after-discharge care     Destination     [facility] Preferred SNF .   Service: Skilled Nursing Contact information: [address] [place] [address] [phone]                      The results of significant diagnostics from this hospitalization (including imaging, microbiology, ancillary and laboratory) are listed below for reference.    Significant Diagnostic Studies: CT Head Wo Contrast  Result Date: 11/11/2020 CLINICAL DATA:  Recent fall yesterday morning with headaches and neck pain, initial encounter EXAM: CT HEAD WITHOUT CONTRAST CT CERVICAL SPINE WITHOUT CONTRAST TECHNIQUE: Multidetector CT imaging of the head and cervical spine was performed following the standard protocol without intravenous contrast. Multiplanar CT image reconstructions of the cervical spine were also generated. COMPARISON:  None. FINDINGS: CT HEAD FINDINGS Brain: No evidence of acute infarction, hemorrhage, hydrocephalus, extra-axial collection or mass lesion/mass effect. Mild atrophic changes are noted. Vascular: No hyperdense vessel or unexpected calcification. Skull: Normal. Negative for fracture or focal lesion. Sinuses/Orbits: No acute finding. Other: None. CT CERVICAL SPINE FINDINGS Alignment: Within normal limits. Skull base and vertebrae: 7 cervical segments are well visualized. Vertebral body height is well maintained. Disc space  narrowing at C5-6 and C6-7 is noted with associated osteophytic changes. Mild facet hypertrophic changes are seen. No acute fracture or acute facet abnormality is noted. Soft tissues and spinal canal: Surrounding soft tissue structures are within normal limits. Upper chest: Visualized lung apices are unremarkable. Other: None IMPRESSION: CT of the head: Chronic atrophic changes without acute abnormality. CT of the cervical spine: Multilevel degenerative change without acute abnormality. Electronically Signed   By: Alcide Clever M.D.   On: 11/11/2020 03:21   CT Cervical Spine Wo Contrast  Result Date: 11/11/2020 CLINICAL DATA:  Recent fall yesterday morning with headaches and neck pain, initial encounter EXAM: CT HEAD WITHOUT CONTRAST CT CERVICAL SPINE WITHOUT CONTRAST TECHNIQUE: Multidetector CT imaging of the head and cervical spine was performed following the standard protocol without intravenous contrast. Multiplanar CT image reconstructions of the cervical spine were also generated. COMPARISON:  None. FINDINGS: CT HEAD FINDINGS Brain: No evidence of acute infarction, hemorrhage, hydrocephalus, extra-axial collection or mass lesion/mass effect. Mild atrophic changes are noted. Vascular: No hyperdense vessel or unexpected calcification. Skull: Normal. Negative for fracture or focal lesion. Sinuses/Orbits: No acute finding. Other: None. CT CERVICAL SPINE FINDINGS Alignment: Within normal limits. Skull base and vertebrae: 7 cervical segments are well visualized. Vertebral body height is well maintained. Disc space narrowing at C5-6 and C6-7 is noted with associated osteophytic changes. Mild facet hypertrophic changes are seen. No acute fracture or acute facet abnormality is noted. Soft tissues and spinal canal: Surrounding soft tissue structures are within normal limits. Upper chest: Visualized lung apices are unremarkable. Other: None IMPRESSION: CT of the head: Chronic atrophic changes without acute  abnormality. CT of the cervical spine: Multilevel degenerative change without acute abnormality. Electronically Signed   By: Alcide Clever M.D.   On: 11/11/2020 03:21   CT PELVIS WO CONTRAST  Result Date: 11/11/2020 CLINICAL DATA:  Recent fall with hip pain, initial encounter EXAM: CT PELVIS WITHOUT CONTRAST TECHNIQUE: Multidetector CT imaging of the pelvis was performed following the standard protocol without intravenous contrast. COMPARISON:  Plain film from the previous day. FINDINGS: Urinary Tract:  Bladder is well distended. Bowel: Visualized large and small bowel show no acute abnormality. The pelvic is within normal limits. Vascular/Lymphatic: Sclerotic calcifications are noted without aneurysmal dilatation. Reproductive:  No mass or other significant abnormality Other:  None. Musculoskeletal: Previously seen left inferior and superior pubic rami fractures are again identified with only mild displacement at the fracture site. IMPRESSION: Left superior and inferior pubic rami fractures without significant displacement. No other fractures are seen. Electronically Signed   By: Alcide Clever M.D.   On: 11/11/2020 03:26   ECHOCARDIOGRAM COMPLETE  Result Date: 11/13/2020    ECHOCARDIOGRAM REPORT   Patient Name:   Beverly Andrews Date of Exam: 11/13/2020 Medical Rec #:  [id number]      Height:       67.0 in Accession #:    [id number]     Weight:       171.7 lb Date of Birth:  01/22/1956      BSA:          1.895 m Patient Age:    65 years       BP:           115/71 mmHg Patient Gender: F              HR:           85 bpm. Exam Location:  Inpatient Procedure: 2D Echo Indications:    acute diastolic chf  History:        Patient has prior history of Echocardiogram examinations, most                 recent 04/10/2019. Cardiomyopathy, Defibrillator, Arrythmias:LBBB;                 Risk Factors:Hypertension.  Sonographer:    Delcie Roch RDCS Referring Phys: [id number] BRITTAINY M SIMMONS IMPRESSIONS  1. Left ventricular  ejection fraction, by estimation, is 60 to 65%. The left ventricle has normal function. The left ventricle has  no regional wall motion abnormalities. Left ventricular diastolic parameters are consistent with Grade I diastolic dysfunction (impaired relaxation).  2. Right ventricular systolic function is normal. The right ventricular size is normal. There is normal pulmonary artery systolic pressure. The estimated right ventricular systolic pressure is 33.9 mmHg.  3. The mitral valve is normal in structure. Mild mitral valve regurgitation. No evidence of mitral stenosis.  4. The aortic valve is normal in structure. Aortic valve regurgitation is not visualized. No aortic stenosis is present.  5. The inferior vena cava is normal in size with greater than 50% respiratory variability, suggesting right atrial pressure of 3 mmHg. FINDINGS  Left Ventricle: Left ventricular ejection fraction, by estimation, is 60 to 65%. The left ventricle has normal function. The left ventricle has no regional wall motion abnormalities. The left ventricular internal cavity size was normal in size. There is  no left ventricular hypertrophy. Left ventricular diastolic parameters are consistent with Grade I diastolic dysfunction (impaired relaxation). Right Ventricle: The right ventricular size is normal. No increase in right ventricular wall thickness. Right ventricular systolic function is normal. There is normal pulmonary artery systolic pressure. The tricuspid regurgitant velocity is 2.78 m/s, and  with an assumed right atrial pressure of 3 mmHg, the estimated right ventricular systolic pressure is 33.9 mmHg. Left Atrium: Left atrial size was normal in size. Right Atrium: Right atrial size was normal in size. Pericardium: There is no evidence of pericardial effusion. Mitral Valve: The mitral valve is normal in structure. Mild mitral valve regurgitation. No evidence of mitral valve stenosis. Tricuspid Valve: The tricuspid valve is normal in  structure. Tricuspid valve regurgitation is mild . No evidence of tricuspid stenosis. Aortic Valve: The aortic valve is normal in structure. Aortic valve regurgitation is not visualized. No aortic stenosis is present. Pulmonic Valve: The pulmonic valve was normal in structure. Pulmonic valve regurgitation is not visualized. No evidence of pulmonic stenosis. Aorta: The aortic root is normal in size and structure. Venous: The inferior vena cava is normal in size with greater than 50% respiratory variability, suggesting right atrial pressure of 3 mmHg. IAS/Shunts: No atrial level shunt detected by color flow Doppler. Additional Comments: A device lead is visualized in the right ventricle.  LEFT VENTRICLE PLAX 2D LVIDd:         4.50 cm   Diastology LVIDs:         3.30 cm   LV e' medial:    8.05 cm/s LV PW:         0.90 cm   LV E/e' medial:  8.5 LV IVS:        0.80 cm   LV e' lateral:   7.83 cm/s LVOT diam:     1.90 cm   LV E/e' lateral: 8.8 LV SV:         58 LV SV Index:   30 LVOT Area:     2.84 cm  RIGHT VENTRICLE RV S prime:     12.50 cm/s TAPSE (M-mode): 2.2 cm LEFT ATRIUM             Index        RIGHT ATRIUM           Index LA diam:        3.50 cm 1.85 cm/m   RA Area:     10.40 cm LA Vol (A2C):   30.1 ml 15.88 ml/m  RA Volume:   20.60 ml  10.87 ml/m LA Vol (A4C):   29.8 ml  15.72 ml/m LA Biplane Vol: 31.2 ml 16.46 ml/m  AORTIC VALVE LVOT Vmax:   115.00 cm/s LVOT Vmean:  75.300 cm/s LVOT VTI:    0.203 m  AORTA Ao Root diam: 2.80 cm Ao Asc diam:  3.00 cm MITRAL VALVE                TRICUSPID VALVE MV Area (PHT): 3.34 cm     TR Peak grad:   30.9 mmHg MV Decel Time: 227 msec     TR Vmax:        278.00 cm/s MV E velocity: 68.60 cm/s MV A velocity: 110.00 cm/s  SHUNTS MV E/A ratio:  0.62         Systemic VTI:  0.20 m                             Systemic Diam: 1.90 cm Donato Schultz MD Electronically signed by Donato Schultz MD Signature Date/Time: 11/13/2020/1:52:49 PM    Final    DG Hip Unilat W or Wo Pelvis 2-3 Views  Left  Result Date: 11/10/2020 CLINICAL DATA:  Hip pain.  Status post fall. EXAM: DG HIP (WITH OR WITHOUT PELVIS) 2-3V LEFT COMPARISON:  None. FINDINGS: Both hips are located. No evidence for hip fracture or dislocation. Acute fractures involve the superior and inferior left pubic rami. Nondisplaced. Right hip appears intact and located. IMPRESSION: Acute fractures of the left superior and inferior pubic rami. Electronically Signed   By: Signa Kell M.D.   On: 11/10/2020 19:59    Microbiology: Recent Results (from the past 240 hour(s))  Resp Panel by RT-PCR (Flu A&B, Covid) Nasopharyngeal Swab     Status: None   Collection Time: 11/11/20  2:44 AM   Specimen: Nasopharyngeal Swab; Nasopharyngeal(NP) swabs in vial transport medium  Result Value Ref Range Status   SARS Coronavirus 2 by RT PCR NEGATIVE NEGATIVE Final    Comment: (NOTE) SARS-CoV-2 target nucleic acids are NOT DETECTED.  The SARS-CoV-2 RNA is generally detectable in upper respiratory specimens during the acute phase of infection. The lowest concentration of SARS-CoV-2 viral copies this assay can detect is 138 copies/mL. A negative result does not preclude SARS-Cov-2 infection and should not be used as the sole basis for treatment or other patient management decisions. A negative result may occur with  improper specimen collection/handling, submission of specimen other than nasopharyngeal swab, presence of viral mutation(s) within the areas targeted by this assay, and inadequate number of viral copies(<138 copies/mL). A negative result must be combined with clinical observations, patient history, and epidemiological information. The expected result is Negative.  Fact Sheet for Patients:  BloggerCourse.com  Fact Sheet for Healthcare Providers:  SeriousBroker.it  This test is no t yet approved or cleared by the [place] FDA and  has been authorized for detection and/or  diagnosis of SARS-CoV-2 by FDA under an Emergency Use Authorization (EUA). This EUA will remain  in effect (meaning this test can be used) for the duration of the COVID-19 declaration under Section 564(b)(1) of the Act, 21 U.S.C.section 360bbb-3(b)(1), unless the authorization is terminated  or revoked sooner.       Influenza A by PCR NEGATIVE NEGATIVE Final   Influenza B by PCR NEGATIVE NEGATIVE Final    Comment: (NOTE) The Xpert Xpress SARS-CoV-2/FLU/RSV plus assay is intended as an aid in the diagnosis of influenza from Nasopharyngeal swab specimens and should not be used as a sole basis for treatment.  Nasal washings and aspirates are unacceptable for Xpert Xpress SARS-CoV-2/FLU/RSV testing.  Fact Sheet for Patients: BloggerCourse.com  Fact Sheet for Healthcare Providers: SeriousBroker.it  This test is not yet approved or cleared by the [place] FDA and has been authorized for detection and/or diagnosis of SARS-CoV-2 by FDA under an Emergency Use Authorization (EUA). This EUA will remain in effect (meaning this test can be used) for the duration of the COVID-19 declaration under Section 564(b)(1) of the Act, 21 U.S.C. section 360bbb-3(b)(1), unless the authorization is terminated or revoked.  Performed at [facility], [address], [place], [place] [address]   Resp Panel by RT-PCR (Flu A&B, Covid) Nasopharyngeal Swab     Status: None   Collection Time: 11/13/20 12:58 PM   Specimen: Nasopharyngeal Swab; Nasopharyngeal(NP) swabs in vial transport medium  Result Value Ref Range Status   SARS Coronavirus 2 by RT PCR NEGATIVE NEGATIVE Final    Comment: (NOTE) SARS-CoV-2 target nucleic acids are NOT DETECTED.  The SARS-CoV-2 RNA is generally detectable in upper respiratory specimens during the acute phase of infection. The lowest concentration of SARS-CoV-2 viral copies this assay can detect is 138 copies/mL. A  negative result does not preclude SARS-Cov-2 infection and should not be used as the sole basis for treatment or other patient management decisions. A negative result may occur with  improper specimen collection/handling, submission of specimen other than nasopharyngeal swab, presence of viral mutation(s) within the areas targeted by this assay, and inadequate number of viral copies(<138 copies/mL). A negative result must be combined with clinical observations, patient history, and epidemiological information. The expected result is Negative.  Fact Sheet for Patients:  BloggerCourse.com  Fact Sheet for Healthcare Providers:  SeriousBroker.it  This test is no t yet approved or cleared by the [place] FDA and  has been authorized for detection and/or diagnosis of SARS-CoV-2 by FDA under an Emergency Use Authorization (EUA). This EUA will remain  in effect (meaning this test can be used) for the duration of the COVID-19 declaration under Section 564(b)(1) of the Act, 21 U.S.C.section 360bbb-3(b)(1), unless the authorization is terminated  or revoked sooner.       Influenza A by PCR NEGATIVE NEGATIVE Final   Influenza B by PCR NEGATIVE NEGATIVE Final    Comment: (NOTE) The Xpert Xpress SARS-CoV-2/FLU/RSV plus assay is intended as an aid in the diagnosis of influenza from Nasopharyngeal swab specimens and should not be used as a sole basis for treatment. Nasal washings and aspirates are unacceptable for Xpert Xpress SARS-CoV-2/FLU/RSV testing.  Fact Sheet for Patients: BloggerCourse.com  Fact Sheet for Healthcare Providers: SeriousBroker.it  This test is not yet approved or cleared by the [place] FDA and has been authorized for detection and/or diagnosis of SARS-CoV-2 by FDA under an Emergency Use Authorization (EUA). This EUA will remain in effect (meaning this test can  be used) for the duration of the COVID-19 declaration under Section 564(b)(1) of the Act, 21 U.S.C. section 360bbb-3(b)(1), unless the authorization is terminated or revoked.  Performed at [facility], [address], [place], [place] [address]      Labs: Basic Metabolic Panel: Recent Labs  Lab 11/10/20 1858 11/11/20 0614  NA 129* 132*  K 4.9 4.3  CL 98 103  CO2 21* 18*  GLUCOSE 125* 96  BUN 17 16  CREATININE 1.32* 1.17*  CALCIUM 9.4 9.1   Liver Function Tests: No results for input(s): AST, ALT, ALKPHOS, BILITOT, PROT, ALBUMIN in the last 168  hours. No results for input(s): LIPASE, AMYLASE in the last 168 hours. No results for input(s): AMMONIA in the last 168 hours. CBC: Recent Labs  Lab 11/10/20 1858 11/11/20 0614  WBC 10.0 8.1  NEUTROABS 8.2*  --   HGB 12.5 13.1  HCT 33.7* 36.3  MCV 114.6* 119.4*  PLT 131* 135*   Cardiac Enzymes: No results for input(s): CKTOTAL, CKMB, CKMBINDEX, TROPONINI in the last 168 hours. BNP: BNP (last 3 results) No results for input(s): BNP in the last 8760 hours.  ProBNP (last 3 results) No results for input(s): PROBNP in the last 8760 hours.  CBG: No results for input(s): GLUCAP in the last 168 hours.     Signed:  Darlin Drop, MD [facility] 11/13/2020, 3:55 PM

## 2020-11-13 NOTE — TOC Progression Note (Addendum)
Transition of Care [facility]) - Progression Note    Patient Details  Name: Beverly Andrews MRN: [id number] Date of Birth: 1955-08-03  Transition [facility]) CM/SW Contact  Loveah Like, LCSWA Phone Number: 11/13/2020, 9:32 AM  Clinical Narrative:    9:30am - HF CSW spoke with [name] about [facility] SNF extending a bed offer as well and will be by today to discuss rehab and insurance authorization. 12pm - CSW spoke with [name] at bedside and she reported she would like to go to [facility]. CSW informed [name] that a discharge summary is incomplete in her chart and has anyone spoken with her about discharging today and [name] reported that nobody informed her about being discharged today. CSW reached out to [facility] about bed availability however nobody will answer the phone and the CSW has left several messages for them to return the call. CSW spoke with [name] about the other SNF bed offers and she is open to [facility] and the other offers extended. CSW outreached [facility] who reported they don't have any beds available. CSW outreached [address] and [facility], SNF's and is awaiting response.  12:46pm - [name] from [facility] responded to a text message from the CSW asking about the insurance authorization needed for [name]. 12:51pm - DC summary sitting in incomplete for sake of time, CSW initiated insurance authorization with [employer] and spoke with Casimiro Needle and [place] at [facility] [phone]. Clinicals faxed over to [facility] [phone] ATTN: Discharge pending; authorization#: [id number].  12:57pm - CSW messaged the attending MD and nurse about needing a COVID test before the patient is discharged. 1:41pm - CSW spoke with [name] at [facility] and she did not confirm that she would be extending a bed offer to [name] but would call [employer] to initiate authorization with [employer] for rehab. 1:51pm - [facility] reported they do have beds available today and tomorrow and take  discharges over the weekend. 2:08pm - CSW called [employer] [phone] and spoke with [name] 11/13/20 to verify the clinicals have been received and he confirmed that the case is pending with medical records and could take anywhere from 48-72 hours for a response and can follow the case online at [email or web address] with [employer] using the patient's information. 3:14pm - CSW received a call from [place] at [place] [phone] reporting that the clinicals need to come from the rehab facility directly and not the hospital. CSW reached out to [place] with [facility] as she reported she would contact [employer] about the authorization and did not according to [facility] no SNF has contacted [employer] regarding [name]. No response from [facility]. CSW outreached [facility] who reported they will reach out to [facility] regarding the authorization. CSW provided [place] with the intake number [id number] with [employer] to initiate authorization and Delorise Shiner with [facility] agreed. CSW updated attending MD and nurse of pending insurance authorization needed before DC to SNF.  Awaiting insurance authorization approval from [place], [place] initiating.  CSW will continue to follow throughout discharge.   Expected Discharge Plan: Skilled Nursing Facility Barriers to Discharge: Continued Medical Work up  Expected Discharge Plan and Services Expected Discharge Plan: Skilled Nursing Facility In-house Referral: Clinical Social Work Discharge Planning Services: CM Consult   Living arrangements for the past 2 months: Apartment  Social Determinants of Health (SDOH) Interventions Food Insecurity Interventions: Intervention Not Indicated Financial Strain Interventions: Intervention Not Indicated Housing Interventions: Intervention Not Indicated Transportation Interventions: Intervention Not Indicated  Readmission Risk Interventions No flowsheet data found.  Sheilyn Boehlke, MSW,  LCSWA [phone] Heart Failure Social Worker

## 2020-11-14 DIAGNOSIS — S329XXA Fracture of unspecified parts of lumbosacral spine and pelvis, initial encounter for closed fracture: Secondary | ICD-10-CM | POA: Diagnosis not present

## 2020-11-14 NOTE — Progress Notes (Signed)
Discharge instructions given to pt and nurse at [facility]. Both verbalized understanding of all teaching. Discharge paper given to pt to take with her at discharge. Pt currently in room waiting for [facility] to pick her up.

## 2020-11-14 NOTE — Progress Notes (Signed)
Pt discharged to [facility] via [facility] with all belongings.

## 2020-11-14 NOTE — Discharge Summary (Signed)
Discharge Summary  Beverly Andrews AST:[id number] DOB: 1955-09-22  PCP: Deatra James, MD  Admit date: 11/10/2020 Discharge date: 11/14/2020  Time spent: 35 minutes.  Recommendations for Outpatient Follow-up:  Follow-up with orthopedic surgery Follow-up with cardiology  Discharge Diagnoses:  Active Hospital Problems   Diagnosis Date Noted   Pelvic fracture (HCC) 11/11/2020   Fall at home, initial encounter 11/11/2020   Hyponatremia 11/11/2020   Thrombocytopenia (HCC) 11/11/2020   Non-ischemic cardiomyopathy (HCC) 10/03/2017    Resolved Hospital Problems  No resolved problems to display.    Discharge Condition: Stable  Diet recommendation: Heart healthy diet.  Vitals:   11/14/20 0735 11/14/20 0940  BP: 105/67 104/63  Pulse: 76 84  Resp: 17 17  Temp:  98.1 F (36.7 C)  SpO2: 98% 98%    History of present illness:   Beverly Andrews is a 65 y.o. female with medical history significant of alcohol abuse, gout, hypertension, nonischemic cardiomyopathy status post [employer] CRT-D, LBBB, presented to [facility] ED complaining of left hip pain after having 2 falls at home.  Work-up revealed pelvic CT showing left superior and inferior pubic rami fractures without significant displacement.  Seen by orthopedic surgery, plan for nonoperative management.  She was evaluated by PT OT with recommendation for SNF.  TOC assisting with DC planning and SNF placement.  She is on nonoperative management, weightbearing as tolerated.  She will need to follow-up with orthopedic surgery in 3 weeks to repeat x-rays.   11/14/2020: Seen at bedside, no new complaints.  Seen by cardiology, okay to discharge to rehab.   Hospital Course:  Principal Problem:   Pelvic fracture [facility]) Active Problems:   Non-ischemic cardiomyopathy (HCC)   Fall at home, initial encounter   Hyponatremia   Thrombocytopenia (HCC)   Pelvic fracture, post mechanical fall. CT scan showing left superior and inferior pubic rami  fracture without significant displacement Seen by orthopedic surgery, recommended medical nonoperative management. Pain management Continue PT OT with assistance and fall precaution   Near syncope suspect vasovagal while she was on the commode. CT head negative for any acute intracranial findings. 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 Continue Coreg 3.125 mg twice daily She is on as needed p.o. Lasix Strict I's and O's and daily weight Net I&O -62 cc Follow-up with cardiology.   Alcohol use disorder No evidence of alcohol withdrawal at the time of this visit CIWA protocol was in place.     Code Status: Full code      Consultants: Cardiology Orthopedic surgery   Procedures: None   Antimicrobials: None     Discharge Exam: BP 104/63 (BP Location: Right Arm)   Pulse 84   Temp 98.1 F (36.7 C) (Oral)   Resp 17   Ht 5\' 7"  (1.702 m)   Wt 77.9 kg   SpO2 98%   BMI 26.90 kg/m  General: 65 y.o. year-old female well developed well nourished in no acute distress.  Alert and oriented x3. Cardiovascular: Regular rate and rhythm with no rubs or gallops.  No thyromegaly or JVD noted.   Respiratory: Clear to auscultation with no wheezes or rales. Good inspiratory effort. Abdomen: Soft nontender nondistended with normal bowel sounds x4 quadrants. Musculoskeletal: No lower extremity edema. 2/4 pulses in all 4 extremities. Skin: No ulcerative lesions noted or rashes, Psychiatry: Mood is appropriate for condition and setting  Discharge Instructions You were  cared for by a hospitalist during your hospital stay. If you have any questions about your discharge medications or the care you received while you were in the hospital after you are discharged, you can call the unit and asked to speak with the hospitalist on call if the hospitalist that took care of you is  not available. Once you are discharged, your primary care physician will handle any further medical issues. Please note that NO REFILLS for any discharge medications will be authorized once you are discharged, as it is imperative that you return to your primary care physician (or establish a relationship with a primary care physician if you do not have one) for your aftercare needs so that they can reassess your need for medications and monitor your lab values.   Allergies as of 11/14/2020       Reactions   Amoxicillin Itching, Swelling   Carbocaine [mepivacaine] Hives, Swelling   Penicillins Itching, Swelling   Did it involve swelling of the face/tongue/throat, SOB, or low BP? Yes Did it involve sudden or severe rash/hives, skin peeling, or any reaction on the inside of your mouth or nose? Yes Did you need to seek medical attention at a hospital or doctor's office? No When did it last happen?      10 + years If all above answers are "NO", may proceed with cephalosporin use.   Shellfish Allergy Swelling        Medication List     STOP taking these medications    sacubitril-valsartan 24-26 MG Commonly known as: ENTRESTO   spironolactone 25 MG tablet Commonly known as: ALDACTONE       TAKE these medications    acetaminophen 500 MG tablet Commonly known as: TYLENOL Take 1,000 mg by mouth every 6 (six) hours as needed for moderate pain or headache.   albuterol 108 (90 Base) MCG/ACT inhaler Commonly known as: VENTOLIN HFA Inhale 1-2 puffs into the lungs every 6 (six) hours as needed for wheezing or shortness of breath.   allopurinol 100 MG tablet Commonly known as: ZYLOPRIM Take 100 mg by mouth daily.   BIOTIN PO Take 500 mcg by mouth daily.   carvedilol 3.125 MG tablet Commonly known as: COREG TAKE 1 TABLET BY MOUTH TWICE DAILY WITH A MEAL   diphenhydrAMINE 25 MG tablet Commonly known as: BENADRYL Take 75 mg by mouth at bedtime as needed for sleep.   folic acid 1  MG tablet Commonly known as: FOLVITE Take 1 tablet (1 mg total) by mouth daily.   furosemide 20 MG tablet Commonly known as: LASIX Take 20 mg by mouth daily as needed for fluid or edema (TAKE  AN  ADDITIONAL  TABLET  IF  WEIGHT  INCREASES  BY  2  TO  3  POUNDS.).   ibuprofen 200 MG tablet Commonly known as: ADVIL Take 800 mg by mouth every 6 (six) hours as needed for headache or moderate pain.   multivitamin with minerals Tabs tablet Take 1 tablet by mouth daily.   oxyCODONE-acetaminophen 5-325 MG tablet Commonly known as: PERCOCET/ROXICET Take 1 tablet by mouth every 8 (eight) hours as needed for up to 3 days for moderate pain or severe pain.   potassium chloride SA 20 MEQ tablet Commonly known as: KLOR-CON Take 1 tablet (20 mEq total) by mouth daily. Please make overdue appt for future refills Thank you 2nd attempt What changed: additional instructions   thiamine 100 MG tablet Take 1 tablet (100 mg total) by mouth daily.  Allergies  Allergen Reactions   Amoxicillin Itching and Swelling   Carbocaine [Mepivacaine] Hives and Swelling   Penicillins Itching and Swelling    Did it involve swelling of the face/tongue/throat, SOB, or low BP? Yes Did it involve sudden or severe rash/hives, skin peeling, or any reaction on the inside of your mouth or nose? Yes Did you need to seek medical attention at a hospital or doctor's office? No When did it last happen?      10 + years If all above answers are "NO", may proceed with cephalosporin use.     Shellfish Allergy Swelling    Contact information for follow-up providers     Deatra James, MD. Call today.   Specialty: Family Medicine Why: Please call for a posthospital follow-up appointment. Contact information: [address] [place] [address] [phone]         Thurmon Fair, MD .   Specialty: Cardiology Contact information: [address] [place]  [address] [phone]         Regan Lemming, MD .   Specialty: Cardiology Contact information: [address] [place] [address] [place] [address] [phone]         Roby Lofts, MD. Call today.   Specialty: Orthopedic Surgery Why: Please call for a post hospital follow-up appointment. Contact information: [address] [place] [address] [phone]              Contact information for after-discharge care     Destination     [facility] Preferred SNF .   Service: Skilled Nursing Contact information: [address] [place] [address] [phone]                      The results of significant diagnostics from this hospitalization (including imaging, microbiology, ancillary and laboratory) are listed below for reference.    Significant Diagnostic Studies: CT Head Wo Contrast  Result Date: 11/11/2020 CLINICAL DATA:  Recent fall yesterday morning with headaches and neck pain, initial encounter EXAM: CT HEAD WITHOUT CONTRAST CT CERVICAL SPINE WITHOUT CONTRAST TECHNIQUE: Multidetector CT imaging of the head and cervical spine was performed following the standard protocol without intravenous contrast. Multiplanar CT image reconstructions of the cervical spine were also generated. COMPARISON:  None. FINDINGS: CT HEAD FINDINGS Brain: No evidence of acute infarction, hemorrhage, hydrocephalus, extra-axial collection or mass lesion/mass effect. Mild atrophic changes are noted. Vascular: No hyperdense vessel or unexpected calcification. Skull: Normal. Negative for fracture or focal lesion. Sinuses/Orbits: No acute finding. Other: None. CT CERVICAL SPINE FINDINGS Alignment: Within normal limits. Skull base and vertebrae: 7 cervical segments are well visualized. Vertebral body height is well maintained. Disc space narrowing at C5-6 and C6-7 is noted with associated osteophytic changes. Mild facet hypertrophic changes  are seen. No acute fracture or acute facet abnormality is noted. Soft tissues and spinal canal: Surrounding soft tissue structures are within normal limits. Upper chest: Visualized lung apices are unremarkable. Other: None IMPRESSION: CT of the head: Chronic atrophic changes without acute abnormality. CT of the cervical spine: Multilevel degenerative change without acute abnormality. Electronically Signed   By: Alcide Clever M.D.   On: 11/11/2020 03:21   CT Cervical Spine Wo Contrast  Result Date: 11/11/2020 CLINICAL DATA:  Recent fall yesterday morning with headaches and neck pain, initial encounter EXAM: CT HEAD WITHOUT CONTRAST CT CERVICAL SPINE WITHOUT CONTRAST TECHNIQUE: Multidetector CT imaging of the head and  cervical spine was performed following the standard protocol without intravenous contrast. Multiplanar CT image reconstructions of the cervical spine were also generated. COMPARISON:  None. FINDINGS: CT HEAD FINDINGS Brain: No evidence of acute infarction, hemorrhage, hydrocephalus, extra-axial collection or mass lesion/mass effect. Mild atrophic changes are noted. Vascular: No hyperdense vessel or unexpected calcification. Skull: Normal. Negative for fracture or focal lesion. Sinuses/Orbits: No acute finding. Other: None. CT CERVICAL SPINE FINDINGS Alignment: Within normal limits. Skull base and vertebrae: 7 cervical segments are well visualized. Vertebral body height is well maintained. Disc space narrowing at C5-6 and C6-7 is noted with associated osteophytic changes. Mild facet hypertrophic changes are seen. No acute fracture or acute facet abnormality is noted. Soft tissues and spinal canal: Surrounding soft tissue structures are within normal limits. Upper chest: Visualized lung apices are unremarkable. Other: None IMPRESSION: CT of the head: Chronic atrophic changes without acute abnormality. CT of the cervical spine: Multilevel degenerative change without acute abnormality. Electronically  Signed   By: Alcide Clever M.D.   On: 11/11/2020 03:21   CT PELVIS WO CONTRAST  Result Date: 11/11/2020 CLINICAL DATA:  Recent fall with hip pain, initial encounter EXAM: CT PELVIS WITHOUT CONTRAST TECHNIQUE: Multidetector CT imaging of the pelvis was performed following the standard protocol without intravenous contrast. COMPARISON:  Plain film from the previous day. FINDINGS: Urinary Tract:  Bladder is well distended. Bowel: Visualized large and small bowel show no acute abnormality. The pelvic is within normal limits. Vascular/Lymphatic: Sclerotic calcifications are noted without aneurysmal dilatation. Reproductive:  No mass or other significant abnormality Other:  None. Musculoskeletal: Previously seen left inferior and superior pubic rami fractures are again identified with only mild displacement at the fracture site. IMPRESSION: Left superior and inferior pubic rami fractures without significant displacement. No other fractures are seen. Electronically Signed   By: Alcide Clever M.D.   On: 11/11/2020 03:26   ECHOCARDIOGRAM COMPLETE  Result Date: 11/13/2020    ECHOCARDIOGRAM REPORT   Patient Name:   AHLIYAH NIENOW Date of Exam: 11/13/2020 Medical Rec #:  [id number]      Height:       67.0 in Accession #:    [id number]     Weight:       171.7 lb Date of Birth:  [birth year]      BSA:          1.895 m Patient Age:    65 years       BP:           115/71 mmHg Patient Gender: F              HR:           85 bpm. Exam Location:  Inpatient Procedure: 2D Echo Indications:    acute diastolic chf  History:        Patient has prior history of Echocardiogram examinations, most                 recent 04/10/2019. Cardiomyopathy, Defibrillator, Arrythmias:LBBB;                 Risk Factors:Hypertension.  Sonographer:    Delcie Roch RDCS Referring Phys: [id number] BRITTAINY M SIMMONS IMPRESSIONS  1. Left ventricular ejection fraction, by estimation, is 60 to 65%. The left ventricle has normal function. The left ventricle has  no regional wall motion abnormalities. Left ventricular diastolic parameters are consistent with Grade I diastolic dysfunction (impaired relaxation).  2. Right ventricular systolic function is normal. The right ventricular  size is normal. There is normal pulmonary artery systolic pressure. The estimated right ventricular systolic pressure is 33.9 mmHg.  3. The mitral valve is normal in structure. Mild mitral valve regurgitation. No evidence of mitral stenosis.  4. The aortic valve is normal in structure. Aortic valve regurgitation is not visualized. No aortic stenosis is present.  5. The inferior vena cava is normal in size with greater than 50% respiratory variability, suggesting right atrial pressure of 3 mmHg. FINDINGS  Left Ventricle: Left ventricular ejection fraction, by estimation, is 60 to 65%. The left ventricle has normal function. The left ventricle has no regional wall motion abnormalities. The left ventricular internal cavity size was normal in size. There is  no left ventricular hypertrophy. Left ventricular diastolic parameters are consistent with Grade I diastolic dysfunction (impaired relaxation). Right Ventricle: The right ventricular size is normal. No increase in right ventricular wall thickness. Right ventricular systolic function is normal. There is normal pulmonary artery systolic pressure. The tricuspid regurgitant velocity is 2.78 m/s, and  with an assumed right atrial pressure of 3 mmHg, the estimated right ventricular systolic pressure is 33.9 mmHg. Left Atrium: Left atrial size was normal in size. Right Atrium: Right atrial size was normal in size. Pericardium: There is no evidence of pericardial effusion. Mitral Valve: The mitral valve is normal in structure. Mild mitral valve regurgitation. No evidence of mitral valve stenosis. Tricuspid Valve: The tricuspid valve is normal in structure. Tricuspid valve regurgitation is mild . No evidence of tricuspid stenosis. Aortic Valve: The aortic  valve is normal in structure. Aortic valve regurgitation is not visualized. No aortic stenosis is present. Pulmonic Valve: The pulmonic valve was normal in structure. Pulmonic valve regurgitation is not visualized. No evidence of pulmonic stenosis. Aorta: The aortic root is normal in size and structure. Venous: The inferior vena cava is normal in size with greater than 50% respiratory variability, suggesting right atrial pressure of 3 mmHg. IAS/Shunts: No atrial level shunt detected by color flow Doppler. Additional Comments: A device lead is visualized in the right ventricle.  LEFT VENTRICLE PLAX 2D LVIDd:         4.50 cm   Diastology LVIDs:         3.30 cm   LV e' medial:    8.05 cm/s LV PW:         0.90 cm   LV E/e' medial:  8.5 LV IVS:        0.80 cm   LV e' lateral:   7.83 cm/s LVOT diam:     1.90 cm   LV E/e' lateral: 8.8 LV SV:         58 LV SV Index:   30 LVOT Area:     2.84 cm  RIGHT VENTRICLE RV S prime:     12.50 cm/s TAPSE (M-mode): 2.2 cm LEFT ATRIUM             Index        RIGHT ATRIUM           Index LA diam:        3.50 cm 1.85 cm/m   RA Area:     10.40 cm LA Vol (A2C):   30.1 ml 15.88 ml/m  RA Volume:   20.60 ml  10.87 ml/m LA Vol (A4C):   29.8 ml 15.72 ml/m LA Biplane Vol: 31.2 ml 16.46 ml/m  AORTIC VALVE LVOT Vmax:   115.00 cm/s LVOT Vmean:  75.300 cm/s LVOT VTI:    0.203  m  AORTA Ao Root diam: 2.80 cm Ao Asc diam:  3.00 cm MITRAL VALVE                TRICUSPID VALVE MV Area (PHT): 3.34 cm     TR Peak grad:   30.9 mmHg MV Decel Time: 227 msec     TR Vmax:        278.00 cm/s MV E velocity: 68.60 cm/s MV A velocity: 110.00 cm/s  SHUNTS MV E/A ratio:  0.62         Systemic VTI:  0.20 m                             Systemic Diam: 1.90 cm Donato Schultz MD Electronically signed by Donato Schultz MD Signature Date/Time: 11/13/2020/1:52:49 PM    Final    DG Hip Unilat W or Wo Pelvis 2-3 Views Left  Result Date: 11/10/2020 CLINICAL DATA:  Hip pain.  Status post fall. EXAM: DG HIP (WITH OR WITHOUT  PELVIS) 2-3V LEFT COMPARISON:  None. FINDINGS: Both hips are located. No evidence for hip fracture or dislocation. Acute fractures involve the superior and inferior left pubic rami. Nondisplaced. Right hip appears intact and located. IMPRESSION: Acute fractures of the left superior and inferior pubic rami. Electronically Signed   By: Signa Kell M.D.   On: 11/10/2020 19:59    Microbiology: Recent Results (from the past 240 hour(s))  Resp Panel by RT-PCR (Flu A&B, Covid) Nasopharyngeal Swab     Status: None   Collection Time: 11/11/20  2:44 AM   Specimen: Nasopharyngeal Swab; Nasopharyngeal(NP) swabs in vial transport medium  Result Value Ref Range Status   SARS Coronavirus 2 by RT PCR NEGATIVE NEGATIVE Final    Comment: (NOTE) SARS-CoV-2 target nucleic acids are NOT DETECTED.  The SARS-CoV-2 RNA is generally detectable in upper respiratory specimens during the acute phase of infection. The lowest concentration of SARS-CoV-2 viral copies this assay can detect is 138 copies/mL. A negative result does not preclude SARS-Cov-2 infection and should not be used as the sole basis for treatment or other patient management decisions. A negative result may occur with  improper specimen collection/handling, submission of specimen other than nasopharyngeal swab, presence of viral mutation(s) within the areas targeted by this assay, and inadequate number of viral copies(<138 copies/mL). A negative result must be combined with clinical observations, patient history, and epidemiological information. The expected result is Negative.  Fact Sheet for Patients:  BloggerCourse.com  Fact Sheet for Healthcare Providers:  SeriousBroker.it  This test is no t yet approved or cleared by the [place] FDA and  has been authorized for detection and/or diagnosis of SARS-CoV-2 by FDA under an Emergency Use Authorization (EUA). This EUA will remain  in effect  (meaning this test can be used) for the duration of the COVID-19 declaration under Section 564(b)(1) of the Act, 21 U.S.C.section 360bbb-3(b)(1), unless the authorization is terminated  or revoked sooner.       Influenza A by PCR NEGATIVE NEGATIVE Final   Influenza B by PCR NEGATIVE NEGATIVE Final    Comment: (NOTE) The Xpert Xpress SARS-CoV-2/FLU/RSV plus assay is intended as an aid in the diagnosis of influenza from Nasopharyngeal swab specimens and should not be used as a sole basis for treatment. Nasal washings and aspirates are unacceptable for Xpert Xpress SARS-CoV-2/FLU/RSV testing.  Fact Sheet for Patients: BloggerCourse.com  Fact Sheet for Healthcare Providers: SeriousBroker.it  This test is not  yet approved or cleared by the [place] and has been authorized for detection and/or diagnosis of SARS-CoV-2 by FDA under an Emergency Use Authorization (EUA). This EUA will remain in effect (meaning this test can be used) for the duration of the COVID-19 declaration under Section 564(b)(1) of the Act, 21 U.S.C. section 360bbb-3(b)(1), unless the authorization is terminated or revoked.  Performed at [facility], [address], [place], [place] [address]   Resp Panel by RT-PCR (Flu A&B, Covid) Nasopharyngeal Swab     Status: None   Collection Time: 11/13/20 12:58 PM   Specimen: Nasopharyngeal Swab; Nasopharyngeal(NP) swabs in vial transport medium  Result Value Ref Range Status   SARS Coronavirus 2 by RT PCR NEGATIVE NEGATIVE Final    Comment: (NOTE) SARS-CoV-2 target nucleic acids are NOT DETECTED.  The SARS-CoV-2 RNA is generally detectable in upper respiratory specimens during the acute phase of infection. The lowest concentration of SARS-CoV-2 viral copies this assay can detect is 138 copies/mL. A negative result does not preclude SARS-Cov-2 infection and should not be used as the sole basis for  treatment or other patient management decisions. A negative result may occur with  improper specimen collection/handling, submission of specimen other than nasopharyngeal swab, presence of viral mutation(s) within the areas targeted by this assay, and inadequate number of viral copies(<138 copies/mL). A negative result must be combined with clinical observations, patient history, and epidemiological information. The expected result is Negative.  Fact Sheet for Patients:  BloggerCourse.com  Fact Sheet for Healthcare Providers:  SeriousBroker.it  This test is no t yet approved or cleared by the [place] FDA and  has been authorized for detection and/or diagnosis of SARS-CoV-2 by FDA under an Emergency Use Authorization (EUA). This EUA will remain  in effect (meaning this test can be used) for the duration of the COVID-19 declaration under Section 564(b)(1) of the Act, 21 U.S.C.section 360bbb-3(b)(1), unless the authorization is terminated  or revoked sooner.       Influenza A by PCR NEGATIVE NEGATIVE Final   Influenza B by PCR NEGATIVE NEGATIVE Final    Comment: (NOTE) The Xpert Xpress SARS-CoV-2/FLU/RSV plus assay is intended as an aid in the diagnosis of influenza from Nasopharyngeal swab specimens and should not be used as a sole basis for treatment. Nasal washings and aspirates are unacceptable for Xpert Xpress SARS-CoV-2/FLU/RSV testing.  Fact Sheet for Patients: BloggerCourse.com  Fact Sheet for Healthcare Providers: SeriousBroker.it  This test is not yet approved or cleared by the [place] FDA and has been authorized for detection and/or diagnosis of SARS-CoV-2 by FDA under an Emergency Use Authorization (EUA). This EUA will remain in effect (meaning this test can be used) for the duration of the COVID-19 declaration under Section 564(b)(1) of the Act, 21  U.S.C. section 360bbb-3(b)(1), unless the authorization is terminated or revoked.  Performed at [facility], [address], [place], [place] [address]      Labs: Basic Metabolic Panel: Recent Labs  Lab 11/10/20 1858 11/11/20 0614  NA 129* 132*  K 4.9 4.3  CL 98 103  CO2 21* 18*  GLUCOSE 125* 96  BUN 17 16  CREATININE 1.32* 1.17*  CALCIUM 9.4 9.1   Liver Function Tests: No results for input(s): AST, ALT, ALKPHOS, BILITOT, PROT, ALBUMIN in the last 168 hours. No results for input(s): LIPASE, AMYLASE in the last 168 hours. No results for input(s): AMMONIA in the last 168 hours. CBC: Recent Labs  Lab 11/10/20 1858  11/11/20 0614  WBC 10.0 8.1  NEUTROABS 8.2*  --   HGB 12.5 13.1  HCT 33.7* 36.3  MCV 114.6* 119.4*  PLT 131* 135*   Cardiac Enzymes: No results for input(s): CKTOTAL, CKMB, CKMBINDEX, TROPONINI in the last 168 hours. BNP: BNP (last 3 results) No results for input(s): BNP in the last 8760 hours.  ProBNP (last 3 results) No results for input(s): PROBNP in the last 8760 hours.  CBG: No results for input(s): GLUCAP in the last 168 hours.     Signed:  Darlin Drop, MD [facility] 11/14/2020, 1:21 PM

## 2020-11-14 NOTE — Progress Notes (Signed)
Report called to [facility]. Spoke with Baldemar Lenis, RN who will be taking care of pt there in room 119. Nurse verbalized understanding of report and had no further questions.

## 2020-11-14 NOTE — TOC Transition Note (Addendum)
Transition of Care [facility]) - CM/SW Discharge Note   Patient Details  Name: Lenae Wherley MRN: [id number] Date of Birth: June 01, 1955  Transition of Care [facility]) CM/SW Contact:  Verna Czech [place], [place] Phone Number: [phone] 11/14/2020, 12:29 PM   Clinical Narrative:     Confirmed discharge today with admissions director at [facility]. Per [place]-Authorization received. Patient to be transported by [facility] and will be going to room 119. PT[facility] contacted. Medical necessity form completed. Report to be called in to [phone]  Transition of Care to continue to follow  [facility], LCSW Transition of Care [phone]    Final next level of care: Skilled Nursing Facility Barriers to Discharge: Continued Medical Work up   Patient Goals and CMS Choice Patient states their goals for this hospitalization and ongoing recovery are:: Get up and walk/to be able to care for self CMS Medicare.gov Compare Post Acute Care list provided to:: Patient Choice offered to / list presented to : Patient  Discharge Placement                       Discharge Plan and Services In-house Referral: Clinical Social Work Discharge Planning Services: CM Consult                                 Social Determinants of Health (SDOH) Interventions Food Insecurity Interventions: Intervention Not Indicated Financial Strain Interventions: Intervention Not Indicated Housing Interventions: Intervention Not Indicated Transportation Interventions: Intervention Not Indicated   Readmission Risk Interventions No flowsheet data found.

## 2021-01-13 ENCOUNTER — Other Ambulatory Visit (HOSPITAL_COMMUNITY): Payer: Self-pay | Admitting: *Deleted

## 2021-01-13 MED ORDER — POTASSIUM CHLORIDE CRYS ER 20 MEQ PO TBCR: 20.0000 meq | EXTENDED_RELEASE_TABLET | Freq: Every day | ORAL | 0 refills | Status: DC

## 2021-01-14 ENCOUNTER — Other Ambulatory Visit: Payer: Self-pay | Admitting: Cardiology

## 2021-02-18 ENCOUNTER — Ambulatory Visit (HOSPITAL_BASED_OUTPATIENT_CLINIC_OR_DEPARTMENT_OTHER)
Admission: RE | Admit: 2021-02-18 | Discharge: 2021-02-18 | Disposition: A | Discharge status: Home / Self Care | Payer: BC Managed Care – PPO | Source: Ambulatory Visit | Attending: Internal Medicine | Admitting: Internal Medicine

## 2021-02-18 ENCOUNTER — Other Ambulatory Visit: Payer: Self-pay

## 2021-02-18 ENCOUNTER — Ambulatory Visit (INDEPENDENT_AMBULATORY_CARE_PROVIDER_SITE_OTHER): Payer: BC Managed Care – PPO

## 2021-02-18 ENCOUNTER — Ambulatory Visit (HOSPITAL_COMMUNITY)
Admission: RE | Admit: 2021-02-18 | Discharge: 2021-02-18 | Disposition: A | Discharge status: Home / Self Care | Payer: BC Managed Care – PPO | Source: Ambulatory Visit | Attending: Internal Medicine | Admitting: Internal Medicine

## 2021-02-18 ENCOUNTER — Encounter (HOSPITAL_COMMUNITY): Payer: Self-pay | Admitting: Internal Medicine

## 2021-02-18 VITALS — BP 154/100 | HR 92 | Wt 160.6 lb

## 2021-02-18 DIAGNOSIS — I10 Essential (primary) hypertension: Secondary | ICD-10-CM

## 2021-02-18 DIAGNOSIS — Z79899 Other long term (current) drug therapy: Secondary | ICD-10-CM | POA: Insufficient documentation

## 2021-02-18 DIAGNOSIS — I428 Other cardiomyopathies: Secondary | ICD-10-CM | POA: Diagnosis not present

## 2021-02-18 DIAGNOSIS — I447 Left bundle-branch block, unspecified: Secondary | ICD-10-CM | POA: Diagnosis not present

## 2021-02-18 DIAGNOSIS — Z9181 History of falling: Secondary | ICD-10-CM | POA: Diagnosis not present

## 2021-02-18 DIAGNOSIS — R2242 Localized swelling, mass and lump, left lower limb: Secondary | ICD-10-CM | POA: Diagnosis not present

## 2021-02-18 DIAGNOSIS — R600 Localized edema: Secondary | ICD-10-CM | POA: Insufficient documentation

## 2021-02-18 DIAGNOSIS — R400 Somnolence: Secondary | ICD-10-CM | POA: Insufficient documentation

## 2021-02-18 DIAGNOSIS — I5022 Chronic systolic (congestive) heart failure: Secondary | ICD-10-CM | POA: Insufficient documentation

## 2021-02-18 DIAGNOSIS — I110 Hypertensive heart disease with heart failure: Secondary | ICD-10-CM | POA: Insufficient documentation

## 2021-02-18 DIAGNOSIS — F1010 Alcohol abuse, uncomplicated: Secondary | ICD-10-CM

## 2021-02-18 LAB — COMPREHENSIVE METABOLIC PANEL
ALT: 39 U/L (ref 0–44)
AST: 79 U/L — ABNORMAL HIGH (ref 15–41)
Albumin: 3.9 g/dL (ref 3.5–5.0)
Alkaline Phosphatase: 94 U/L (ref 38–126)
Anion gap: 14 (ref 5–15)
BUN: <5 mg/dL — ABNORMAL LOW (ref 8–23)
CO2: 20 mmol/L — ABNORMAL LOW (ref 22–32)
Calcium: 9.5 mg/dL (ref 8.9–10.3)
Chloride: 110 mmol/L (ref 98–111)
Creatinine, Ser: 0.95 mg/dL (ref 0.44–1.00)
GFR, Estimated: >60 mL/min (ref >60)
Glucose, Bld: 100 mg/dL — ABNORMAL HIGH (ref 70–99)
Potassium: 3.1 mmol/L — ABNORMAL LOW (ref 3.5–5.1)
Sodium: 144 mmol/L (ref 135–145)
Total Bilirubin: 0.5 mg/dL (ref 0.3–1.2)
Total Protein: 6.6 g/dL (ref 6.5–8.1)

## 2021-02-18 LAB — CBC
HCT: 44.8 % (ref 36.0–46.0)
Hemoglobin: 15.1 g/dL — ABNORMAL HIGH (ref 12.0–15.0)
MCH: 35.4 pg — ABNORMAL HIGH (ref 26.0–34.0)
MCHC: 33.7 g/dL (ref 30.0–36.0)
MCV: 104.9 fL — ABNORMAL HIGH (ref 80.0–100.0)
Platelets: 262 10*3/uL (ref 150–400)
RBC: 4.27 MIL/uL (ref 3.87–5.11)
RDW: 14.1 % (ref 11.5–15.5)
WBC: 5.7 10*3/uL (ref 4.0–10.5)
nRBC: 0 % (ref 0.0–0.2)

## 2021-02-18 LAB — BRAIN NATRIURETIC PEPTIDE: B Natriuretic Peptide: 38.2 pg/mL (ref 0.0–100.0)

## 2021-02-18 MED ORDER — ENTRESTO 49-51 MG PO TABS: 1.0000 | ORAL_TABLET | Freq: Two times a day (BID) | ORAL | 3 refills | Status: DC

## 2021-02-18 MED ORDER — CARVEDILOL 3.125 MG PO TABS: 3.1250 mg | ORAL_TABLET | Freq: Two times a day (BID) | ORAL | 3 refills | Status: DC

## 2021-02-18 MED ORDER — POTASSIUM CHLORIDE CRYS ER 20 MEQ PO TBCR: 20.0000 meq | EXTENDED_RELEASE_TABLET | Freq: Every day | ORAL | 3 refills | Status: DC

## 2021-02-18 NOTE — Patient Instructions (Addendum)
Labs done today. We will contact you only if your labs are abnormal.  RESTART Entresto 49-51mg  (1 tablet) by mouth 2 times daily.   No medication changes were made. Please continue all current medications as prescribed.  Your physician has requested that you have a lower or upper extremity venous duplex. This test is an ultrasound of the veins in the legs or arms. It looks at venous blood flow that carries blood from the heart to the legs or arms. Allow one hour for a Lower Venous exam. Allow thirty minutes for an Upper Venous exam. There are no restrictions or special instructions.  Your physician recommends that you schedule a follow-up appointment in: 6 months. Please contact our office in June 2023 to schedule a July 2023 appointment.    If you have any questions or concerns before your next appointment please send [place] a message through [place] or call our office at [phone].    TO LEAVE A MESSAGE FOR THE NURSE SELECT OPTION 2, PLEASE LEAVE A MESSAGE INCLUDING: YOUR NAME DATE OF BIRTH CALL BACK NUMBER REASON FOR CALL**this is important as we prioritize the call backs  YOU WILL RECEIVE A CALL BACK THE SAME DAY AS LONG AS YOU CALL BEFORE 4:00 PM   Do the following things EVERYDAY: Weigh yourself in the morning before breakfast. Write it down and keep it in a log. Take your medicines as prescribed Eat low salt foods--Limit salt (sodium) to 2000 mg per day.  Stay as active as you can everyday Limit all fluids for the day to less than 2 liters   At the [facility], you and your health needs are our priority. As part of our continuing mission to provide you with exceptional heart care, we have created designated Provider Care Teams. These Care Teams include your primary Cardiologist (physician) and Advanced Practice Providers (APPs- Physician Assistants and Nurse Practitioners) who all work together to provide you with the care you need, when you need it.   You may  see any of the following providers on your designated Care Team at your next follow up: Dr Arvilla Meres Dr Carron Curie, NP Robbie Lis, [place] Karle Plumber, PharmD   Please be sure to bring in all your medications bottles to every appointment.

## 2021-02-18 NOTE — Progress Notes (Signed)
Advanced [facility] Clinic Note  PCP: Donald Prose, MD PCP-Cardiologist: Sanda Klein, MD   HPI: Beverly Andrews is a 66 y.o. female with chronic systolic CHF, NICM, LBBB.  Seen in hospital 09/18/17 with DOE and new LBBB. Echo with new systolic CHF as below. Cath with normal coronaries.  Underwent MDT CRT-D in 9/20   Had a fall with pelvic fx in 10/22 in setting of a vagal episode.   Today she returns for HF follow up. Living with her mother. Not very active. Mild exertional dyspnea. No orthopnea or PND. Left ankle has been swollen. SBP 170-200.  ICD interrogated: No VT/AF. 100 Bivpacing. Activity level 4hr/day. Optivol persistently elevated (not sure if accurate)   Echo 10/22 EF 55% normal RV Personally reviewed  Cardiac studies:  ECHo 03/26/2018 EF 25-30%   Echo 09/19/17 LVEF 20-25%, LBBB, Mod central MR.   cMRI 10/24/17 with EF 20-25%, Normal RV, normal atria. Findings suspicious for non-compaction cardiomyopathy.   CPX 3/20 FVC 3.55 (101%)      FEV1 2.49 (91%)         Resting HR: 70 Peak HR: 150   (94% age predicted max HR)  BP rest: 104/70 BP peak: 148/60  Peak VO2: 22.1 (103% predicted peak VO2)  VE/VCO2 slope:  36  OUES: 1.62  Peak RER: 1.06  O2pulse:  11   (110% predicted O2pulse)  R/LHC 09/20/17 - Normal coronary arteries Hemodynamics (mmHg) RA mean 2 RV 31/0 PA 30/11 PCWP 11 AO 111/70 Cardiac Output (Fick) 5.72 Cardiac Index (Fick) 3.28  Review of systems complete and found to be negative unless listed in HPI.    Past Medical History:  Diagnosis Date   Alcohol abuse    Gout    History of cardiac cath 09/24/2017   09/24/17: normal coronary arteries, EF <25%   Hypertension    Non-ischemic cardiomyopathy ([facility]) 09/18/2017   09/18/17- EF 20-25%   Recurrent pneumonia     Current Outpatient Medications  Medication Sig Dispense Refill   acetaminophen (TYLENOL) 500 MG tablet Take 1,000 mg by mouth every 6 (six) hours as needed for moderate pain or  headache.     albuterol (PROVENTIL HFA;VENTOLIN HFA) 108 (90 Base) MCG/ACT inhaler Inhale 1-2 puffs into the lungs every 6 (six) hours as needed for wheezing or shortness of breath.     allopurinol (ZYLOPRIM) 100 MG tablet Take 100 mg by mouth daily.     BIOTIN PO Take 500 mcg by mouth daily.     carvedilol (COREG) 3.125 MG tablet TAKE 1 TABLET BY MOUTH TWICE DAILY WITH A MEAL 180 tablet 3   diphenhydrAMINE (BENADRYL) 25 MG tablet Take 75 mg by mouth at bedtime as needed for sleep.      escitalopram (LEXAPRO) 10 MG tablet Take 10 mg by mouth daily.     ibuprofen (ADVIL) 200 MG tablet Take 800 mg by mouth every 6 (six) hours as needed for headache or moderate pain.     potassium chloride SA (KLOR-CON M) 20 MEQ tablet Take 1 tablet (20 mEq total) by mouth daily. 30 tablet 0   furosemide (LASIX) 20 MG tablet Take 20 mg by mouth daily as needed for fluid or edema (TAKE  AN  ADDITIONAL  TABLET  IF  WEIGHT  INCREASES  BY  2  TO  3  POUNDS.). (Patient not taking: Reported on 02/18/2021)     No current facility-administered medications for this encounter.    Allergies  Allergen Reactions   Amoxicillin Itching and  Swelling   Carbocaine [Mepivacaine] Hives and Swelling   Penicillins Itching and Swelling    Did it involve swelling of the face/tongue/throat, SOB, or low BP? Yes Did it involve sudden or severe rash/hives, skin peeling, or any reaction on the inside of your mouth or nose? Yes Did you need to seek medical attention at a hospital or doctor's office? No When did it last happen?      10 + years If all above answers are "NO", may proceed with cephalosporin use.     Shellfish Allergy Swelling      Social History   Socioeconomic History   Marital status: Divorced    Spouse name: Not on file   Number of children: Not on file   Years of education: Not on file   Highest education level: Not on file  Occupational History   Occupation: Works at an [employer] store  Tobacco Use   Smoking  status: Former   Smokeless tobacco: Never   Tobacco comments:    quit 6 months ago  Vaping Use   Vaping Use: Never used  Substance and Sexual Activity   Alcohol use: Yes    Alcohol/week: 21.0 standard drinks    Types: 21 Standard drinks or equivalent per week    Comment: 3-4 cocktails a day   Drug use: Never   Sexual activity: Not on file  Other Topics Concern   Not on file  Social History Narrative   Not on file   Social Determinants of Health   Financial Resource Strain: Low Risk    Difficulty of Paying Living Expenses: Not very hard  Food Insecurity: No Food Insecurity   Worried About Running Out of Food in the Last Year: Never true   Ran Out of Food in the Last Year: Never true  Transportation Needs: No Transportation Needs   Lack of Transportation (Medical): No   Lack of Transportation (Non-Medical): No  Physical Activity: Not on file  Stress: Not on file  Social Connections: Not on file  Intimate Partner Violence: Not on file      Family History  Problem Relation Age of Onset   AAA (abdominal aortic aneurysm) Father    Aortic stenosis Father    Prostate cancer Father     Vitals:   02/18/21 1423  BP: (!) 154/100  Pulse: 92  SpO2: 97%  Weight: 72.8 kg (160 lb 9.6 oz)    Wt Readings from Last 3 Encounters:  02/18/21 72.8 kg (160 lb 9.6 oz)  11/13/20 77.9 kg (171 lb 11.8 oz)  09/10/19 76.7 kg (169 lb)   General:  Well appearing. No resp difficulty HEENT: normal Neck: supple. no JVD. Carotids 2+ bilat; no bruits. No lymphadenopathy or thryomegaly appreciated. Cor: PMI nondisplaced. Regular rate & rhythm. No rubs, gallops or murmurs. Lungs: clear Abdomen: soft, nontender, nondistended. No hepatosplenomegaly. No bruits or masses. Good bowel sounds. Extremities: no cyanosis, clubbing, rash, edema Neuro: alert & orientedx3, cranial nerves grossly intact. moves all 4 extremities w/o difficulty. Affect pleasant   ASSESSMENT & PLAN:  1. 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 Findings suspicious for non-compaction cardiomyopathy. - ECHO 2/20  EF 25-30%. - s/p MDT CRT-D in 9/20  - Echo 3/21 EF 55% complete recovery with CRT - Echo 10/22 EF 55% normal RV Personally reviewed - Remains NYHA II - Volume status ok. Use lasix only PRN - Continue coreg  3.125 mg BID - Off entresto due to low BP. BP now high. Restart Entresto 49/51 bid - Off spiro due to low BP - Labs today  2. LBBB - s/p MDT CRT-P in 9/20 - EF improved.  - Pacer interrogated personally. 100% bivpacing  3. 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 Offered genetic screening but she wants to defer due to cost  - No evidence of arrythmias thus far. Will follow closely for need for [facility]   5. Daytime fatigue - high risk for OSA.  -sleep study 8/21 AHI 1.1 (normal)  6. LLE swelling - check u/s  7. HTN - BP elevated - Restarting Jeanie Sewer, MD 02/18/21

## 2021-02-19 ENCOUNTER — Telehealth (HOSPITAL_COMMUNITY): Payer: Self-pay | Admitting: Pharmacy Technician

## 2021-02-19 LAB — CUP PACEART REMOTE DEVICE CHECK
Battery Remaining Longevity: 39 mo
Battery Voltage: 2.95 V
Brady Statistic AP VP Percent: 2.98 %
Brady Statistic AP VS Percent: 0.07 %
Brady Statistic AS VP Percent: 95.45 %
Brady Statistic AS VS Percent: 1.49 %
Brady Statistic RA Percent Paced: 3.06 %
Brady Statistic RV Percent Paced: 16.44 %
Date Time Interrogation Session: 20230112143602
HighPow Impedance: 82 Ohm
Implantable Lead Implant Date: 20200925
Implantable Lead Implant Date: 20200925
Implantable Lead Implant Date: 20200925
Implantable Lead Location: 753859
Implantable Lead Location: 753860
Implantable Lead Location: 753862
Implantable Lead Model: 4398
Implantable Lead Model: 5076
Implantable Pulse Generator Implant Date: 20200925
Lead Channel Impedance Value: 304 Ohm
Lead Channel Impedance Value: 323 Ohm
Lead Channel Impedance Value: 361 Ohm
Lead Channel Impedance Value: 380 Ohm
Lead Channel Impedance Value: 380 Ohm
Lead Channel Impedance Value: 399 Ohm
Lead Channel Impedance Value: 418 Ohm
Lead Channel Impedance Value: 456 Ohm
Lead Channel Impedance Value: 665 Ohm
Lead Channel Impedance Value: 684 Ohm
Lead Channel Impedance Value: 684 Ohm
Lead Channel Impedance Value: 684 Ohm
Lead Channel Impedance Value: 741 Ohm
Lead Channel Pacing Threshold Amplitude: 0.75 V
Lead Channel Pacing Threshold Amplitude: 0.875 V
Lead Channel Pacing Threshold Amplitude: 3.75 V
Lead Channel Pacing Threshold Pulse Width: 0.4 ms
Lead Channel Pacing Threshold Pulse Width: 0.4 ms
Lead Channel Pacing Threshold Pulse Width: 0.4 ms
Lead Channel Sensing Intrinsic Amplitude: 3.1 mV
Lead Channel Sensing Intrinsic Amplitude: 5.8 mV
Lead Channel Setting Pacing Amplitude: 1.5 V
Lead Channel Setting Pacing Amplitude: 2 V
Lead Channel Setting Pacing Amplitude: 5.5 V
Lead Channel Setting Pacing Pulse Width: 0.4 ms
Lead Channel Setting Pacing Pulse Width: 0.4 ms
Lead Channel Setting Sensing Sensitivity: 0.3 mV

## 2021-02-19 NOTE — Telephone Encounter (Signed)
Patient Advocate Encounter   Received notification from [facility] that prior authorization for Beverly Andrews  is required.   PA submitted on CoverMyMeds Key [id number] Status is pending   Will continue to follow.

## 2021-02-23 ENCOUNTER — Telehealth (HOSPITAL_COMMUNITY): Payer: Self-pay

## 2021-02-23 DIAGNOSIS — I5022 Chronic systolic (congestive) heart failure: Secondary | ICD-10-CM

## 2021-02-23 NOTE — Telephone Encounter (Signed)
As entresto started, pt scheduled for lab appt next week.

## 2021-02-23 NOTE — Telephone Encounter (Signed)
-----   Message from Dolores Patty, MD sent at 02/21/2021 12:45 PM EST ----- K low but Entresto restarted. Recheck BMET next week please

## 2021-02-24 NOTE — Telephone Encounter (Signed)
Received notification from [facility] regarding a prior authorization for  Entresto . Authorization has been APPROVED from 02/19/21 to 02/19/22.    Authorization # [id number]

## 2021-03-02 NOTE — Progress Notes (Signed)
Remote ICD transmission.   

## 2021-03-04 ENCOUNTER — Other Ambulatory Visit: Payer: Self-pay

## 2021-03-04 ENCOUNTER — Ambulatory Visit (HOSPITAL_COMMUNITY)
Admission: RE | Admit: 2021-03-04 | Discharge: 2021-03-04 | Disposition: A | Discharge status: Home / Self Care | Payer: BC Managed Care – PPO | Source: Ambulatory Visit | Attending: Internal Medicine | Admitting: Internal Medicine

## 2021-03-04 DIAGNOSIS — I5022 Chronic systolic (congestive) heart failure: Secondary | ICD-10-CM | POA: Diagnosis present

## 2021-03-04 LAB — BASIC METABOLIC PANEL
Anion gap: 12 (ref 5–15)
BUN: 5 mg/dL — ABNORMAL LOW (ref 8–23)
CO2: 22 mmol/L (ref 22–32)
Calcium: 9.6 mg/dL (ref 8.9–10.3)
Chloride: 108 mmol/L (ref 98–111)
Creatinine, Ser: 1.01 mg/dL — ABNORMAL HIGH (ref 0.44–1.00)
GFR, Estimated: >60 mL/min (ref >60)
Glucose, Bld: 94 mg/dL (ref 70–99)
Potassium: 4.4 mmol/L (ref 3.5–5.1)
Sodium: 142 mmol/L (ref 135–145)

## 2022-03-23 ENCOUNTER — Other Ambulatory Visit (HOSPITAL_COMMUNITY): Payer: Self-pay

## 2022-03-23 ENCOUNTER — Telehealth (HOSPITAL_COMMUNITY): Payer: Self-pay | Admitting: Pharmacy Technician

## 2022-03-23 NOTE — Telephone Encounter (Signed)
Patient Advocate Encounter   Received notification from [facility] that prior authorization for Beverly Andrews is required.   PA submitted on CoverMyMeds Key [id number] Status is pending   Will continue to follow.

## 2022-03-24 ENCOUNTER — Other Ambulatory Visit (HOSPITAL_COMMUNITY): Payer: Self-pay

## 2022-03-24 NOTE — Telephone Encounter (Signed)
Advanced Heart Failure Patient Advocate Encounter  Prior Authorization for Delene Loll has been approved.    PA# [id number] Effective dates: 03/23/22 through 03/24/23  Charlann Boxer, CPhT

## 2022-03-25 ENCOUNTER — Other Ambulatory Visit (HOSPITAL_COMMUNITY): Payer: Self-pay

## 2022-03-25 MED ORDER — CARVEDILOL 3.125 MG PO TABS: 3.1250 mg | ORAL_TABLET | Freq: Two times a day (BID) | ORAL | 3 refills | Status: DC

## 2022-04-05 ENCOUNTER — Other Ambulatory Visit (HOSPITAL_COMMUNITY): Payer: Self-pay

## 2022-04-05 MED ORDER — POTASSIUM CHLORIDE CRYS ER 20 MEQ PO TBCR: 20.0000 meq | EXTENDED_RELEASE_TABLET | Freq: Every day | ORAL | 3 refills | Status: DC

## 2022-04-21 ENCOUNTER — Other Ambulatory Visit (HOSPITAL_COMMUNITY): Payer: Self-pay

## 2022-04-21 MED ORDER — ENTRESTO 49-51 MG PO TABS: 1.0000 | ORAL_TABLET | Freq: Two times a day (BID) | ORAL | 0 refills | Status: DC

## 2022-05-23 ENCOUNTER — Other Ambulatory Visit (HOSPITAL_COMMUNITY): Payer: Self-pay

## 2022-05-23 MED ORDER — ENTRESTO 49-51 MG PO TABS: 1.0000 | ORAL_TABLET | Freq: Two times a day (BID) | ORAL | 0 refills | Status: DC

## 2022-07-01 ENCOUNTER — Other Ambulatory Visit (HOSPITAL_COMMUNITY): Payer: Self-pay

## 2022-07-01 MED ORDER — ENTRESTO 49-51 MG PO TABS: 1.0000 | ORAL_TABLET | Freq: Two times a day (BID) | ORAL | 0 refills | Status: DC

## 2022-08-03 ENCOUNTER — Other Ambulatory Visit (HOSPITAL_COMMUNITY): Payer: Self-pay | Admitting: *Deleted

## 2022-08-03 MED ORDER — ENTRESTO 49-51 MG PO TABS: 1.0000 | ORAL_TABLET | Freq: Two times a day (BID) | ORAL | 0 refills | Status: DC

## 2022-09-30 ENCOUNTER — Encounter (HOSPITAL_COMMUNITY): Payer: Self-pay | Admitting: Internal Medicine

## 2022-09-30 ENCOUNTER — Ambulatory Visit (HOSPITAL_COMMUNITY)
Admission: RE | Admit: 2022-09-30 | Discharge: 2022-09-30 | Disposition: A | Discharge status: Home / Self Care | Payer: Medicare Other | Source: Ambulatory Visit | Attending: Internal Medicine | Admitting: Internal Medicine

## 2022-09-30 VITALS — BP 140/80 | HR 61 | Wt 179.6 lb

## 2022-09-30 DIAGNOSIS — R0609 Other forms of dyspnea: Secondary | ICD-10-CM | POA: Insufficient documentation

## 2022-09-30 DIAGNOSIS — Z79899 Other long term (current) drug therapy: Secondary | ICD-10-CM | POA: Insufficient documentation

## 2022-09-30 DIAGNOSIS — R5383 Other fatigue: Secondary | ICD-10-CM | POA: Diagnosis not present

## 2022-09-30 DIAGNOSIS — I447 Left bundle-branch block, unspecified: Secondary | ICD-10-CM | POA: Diagnosis not present

## 2022-09-30 DIAGNOSIS — I424 Endocardial fibroelastosis: Secondary | ICD-10-CM | POA: Diagnosis not present

## 2022-09-30 DIAGNOSIS — I110 Hypertensive heart disease with heart failure: Secondary | ICD-10-CM | POA: Diagnosis present

## 2022-09-30 DIAGNOSIS — I5022 Chronic systolic (congestive) heart failure: Secondary | ICD-10-CM

## 2022-09-30 DIAGNOSIS — I5040 Unspecified combined systolic (congestive) and diastolic (congestive) heart failure: Secondary | ICD-10-CM | POA: Insufficient documentation

## 2022-09-30 DIAGNOSIS — F1010 Alcohol abuse, uncomplicated: Secondary | ICD-10-CM

## 2022-09-30 DIAGNOSIS — I10 Essential (primary) hypertension: Secondary | ICD-10-CM

## 2022-09-30 DIAGNOSIS — I959 Hypotension, unspecified: Secondary | ICD-10-CM | POA: Insufficient documentation

## 2022-09-30 LAB — CBC
HCT: 42.2 % (ref 36.0–46.0)
Hemoglobin: 14.7 g/dL (ref 12.0–15.0)
MCH: 37.9 pg — ABNORMAL HIGH (ref 26.0–34.0)
MCHC: 34.8 g/dL (ref 30.0–36.0)
MCV: 108.8 fL — ABNORMAL HIGH (ref 80.0–100.0)
Platelets: 188 10*3/uL (ref 150–400)
RBC: 3.88 MIL/uL (ref 3.87–5.11)
RDW: 11.7 % (ref 11.5–15.5)
WBC: 6.8 10*3/uL (ref 4.0–10.5)
nRBC: 0 % (ref 0.0–0.2)

## 2022-09-30 LAB — BASIC METABOLIC PANEL
Anion gap: 10 (ref 5–15)
BUN: 9 mg/dL (ref 8–23)
CO2: 24 mmol/L (ref 22–32)
Calcium: 10.1 mg/dL (ref 8.9–10.3)
Chloride: 104 mmol/L (ref 98–111)
Creatinine, Ser: 1.06 mg/dL — ABNORMAL HIGH (ref 0.44–1.00)
GFR, Estimated: 58 mL/min — ABNORMAL LOW (ref >60)
Glucose, Bld: 104 mg/dL — ABNORMAL HIGH (ref 70–99)
Potassium: 4.3 mmol/L (ref 3.5–5.1)
Sodium: 138 mmol/L (ref 135–145)

## 2022-09-30 LAB — BRAIN NATRIURETIC PEPTIDE: B Natriuretic Peptide: 245.5 pg/mL — ABNORMAL HIGH (ref 0.0–100.0)

## 2022-09-30 MED ORDER — ENTRESTO 49-51 MG PO TABS: 1.0000 | ORAL_TABLET | Freq: Two times a day (BID) | ORAL | 3 refills | Status: DC

## 2022-09-30 NOTE — Addendum Note (Signed)
Encounter addended by: Linda Hedges, RN on: 09/30/2022 2:04 PM  Actions taken: Order list changed, Diagnosis association updated, Clinical Note Signed, Charge Capture section accepted

## 2022-09-30 NOTE — Progress Notes (Signed)
Advanced [facility] Failure Clinic Note  PCP: Deatra James, MD PCP-Cardiologist: Thurmon Fair, MD   HPI: Beverly Andrews is a 67 y.o. female with chronic systolic CHF, NICM, LBBB.  Seen in hospital 09/18/17 with DOE and new LBBB. Echo with new systolic CHF as below. Cath with normal coronaries.  Underwent MDT CRT-D in 9/20   Had a fall with pelvic fx in 10/22 in setting of a vagal episode.   Today she returns for HF follow up. Living with her mother. Who has dementia. Feels fatigued. Denies CP or SOB. Hurt her back taking care of her mother. No edema, orthopnea or PND.   ICD interrogated: No VT/AF. 97.7% Bivpacing. Activity level 3.5 hr/day. Optivol persistently elevated (not sure if accurate)   Echo 10/22 EF 55% normal RV Personally reviewed  Cardiac studies:  ECHo 03/26/2018 EF 25-30%   Echo 09/19/17 LVEF 20-25%, LBBB, Mod central MR.   cMRI 10/24/17 with EF 20-25%, Normal RV, normal atria. Findings suspicious for non-compaction cardiomyopathy.   CPX 3/20 FVC 3.55 (101%)      FEV1 2.49 (91%)         Resting HR: 70 Peak HR: 150   (94% age predicted max HR)  BP rest: 104/70 BP peak: 148/60  Peak VO2: 22.1 (103% predicted peak VO2)  VE/VCO2 slope:  36  OUES: 1.62  Peak RER: 1.06  O2pulse:  11   (110% predicted O2pulse)  R/LHC 09/20/17 - Normal coronary arteries Hemodynamics (mmHg) RA mean 2 RV 31/0 PA 30/11 PCWP 11 AO 111/70 Cardiac Output (Fick) 5.72 Cardiac Index (Fick) 3.28  Review of systems complete and found to be negative unless listed in HPI.    Past Medical History:  Diagnosis Date   Alcohol abuse    Gout    History of cardiac cath 09/24/2017   09/24/17: normal coronary arteries, EF <25%   Hypertension    Non-ischemic cardiomyopathy ([facility]) 09/18/2017   09/18/17- EF 20-25%   Recurrent pneumonia     Current Outpatient Medications  Medication Sig Dispense Refill   acetaminophen (TYLENOL) 500 MG tablet Take 1,000 mg by mouth every 6 (six) hours as needed for  moderate pain or headache.     allopurinol (ZYLOPRIM) 100 MG tablet Take 100 mg by mouth daily.     BIOTIN PO Take 500 mcg by mouth daily.     carvedilol (COREG) 3.125 MG tablet Take 1 tablet (3.125 mg total) by mouth 2 (two) times daily with a meal. 180 tablet 3   diphenhydrAMINE (BENADRYL) 25 MG tablet Take 75 mg by mouth at bedtime as needed for sleep.      escitalopram (LEXAPRO) 10 MG tablet Take 10 mg by mouth daily.     ibuprofen (ADVIL) 200 MG tablet Take 800 mg by mouth every 6 (six) hours as needed for headache or moderate pain.     potassium chloride SA (KLOR-CON M) 20 MEQ tablet Take 1 tablet (20 mEq total) by mouth daily. 90 tablet 3   sacubitril-valsartan (ENTRESTO) 49-51 MG Take 1 tablet by mouth 2 (two) times daily. 90 tablet 0   No current facility-administered medications for this encounter.    Allergies  Allergen Reactions   Amoxicillin Itching and Swelling   Carbocaine [Mepivacaine] Hives and Swelling   Penicillins Itching and Swelling    Did it involve swelling of the face/tongue/throat, SOB, or low BP? Yes Did it involve sudden or severe rash/hives, skin peeling, or any reaction on the inside of your mouth or nose? Yes Did  you need to seek medical attention at a hospital or doctor's office? No When did it last happen?      10 + years If all above answers are "NO", may proceed with cephalosporin use.     Shellfish Allergy Swelling      Social History   Socioeconomic History   Marital status: Divorced    Spouse name: Not on file   Number of children: Not on file   Years of education: Not on file   Highest education level: Not on file  Occupational History   Occupation: Works at an [employer] store  Tobacco Use   Smoking status: Former   Smokeless tobacco: Never   Tobacco comments:    quit 6 months ago  Vaping Use   Vaping status: Never Used  Substance and Sexual Activity   Alcohol use: Yes    Alcohol/week: 21.0 standard drinks of alcohol    Types: 21  Standard drinks or equivalent per week    Comment: 3-4 cocktails a day   Drug use: Never   Sexual activity: Not on file  Other Topics Concern   Not on file  Social History Narrative   Not on file   Social Determinants of Health   Financial Resource Strain: Low Risk  (11/12/2020)   Overall Financial Resource Strain (CARDIA)    Difficulty of Paying Living Expenses: Not very hard  Food Insecurity: No Food Insecurity (11/12/2020)   Hunger Vital Sign    Worried About Running Out of Food in the Last Year: Never true    Ran Out of Food in the Last Year: Never true  Transportation Needs: No Transportation Needs (11/12/2020)   PRAPARE - Administrator, Civil Service (Medical): No    Lack of Transportation (Non-Medical): No  Physical Activity: Not on file  Stress: Not on file  Social Connections: Not on file  Intimate Partner Violence: Not on file      Family History  Problem Relation Age of Onset   AAA (abdominal aortic aneurysm) Father    Aortic stenosis Father    Prostate cancer Father     Vitals:   09/30/22 1344  BP: (!) 140/80  Pulse: 61  SpO2: 98%  Weight: 81.5 kg (179 lb 9.6 oz)     Wt Readings from Last 3 Encounters:  09/30/22 81.5 kg (179 lb 9.6 oz)  02/18/21 72.8 kg (160 lb 9.6 oz)  11/13/20 77.9 kg (171 lb 11.8 oz)   General:  Well appearing. No resp difficulty HEENT: normal Neck: supple. no JVD. Carotids 2+ bilat; no bruits. No lymphadenopathy or thryomegaly appreciated. Cor: PMI nondisplaced. Regular rate & rhythm. No rubs, gallops or murmurs. Lungs: clear Abdomen: soft, nontender, nondistended. No hepatosplenomegaly. No bruits or masses. Good bowel sounds. Extremities: no cyanosis, clubbing, rash, edema Neuro: alert & orientedx3, cranial nerves grossly intact. moves all 4 extremities w/o difficulty. Affect pleasant  NSR vpacing 62 Personally reviewed  ASSESSMENT & PLAN:  1. Chronic systolic CHF with recovered EF - NICM, likely LBBB induced  cardiomyopathy (versus viral or non-compaction) - cMRI 10/24/17 with EF 20-25%, Normal RV, normal atria. Findings suspicious for non-compaction cardiomyopathy. - ECHO 2/20  EF 25-30%. - s/p MDT CRT-D in 9/20  - Echo 3/21 EF 55% complete recovery with CRT - Echo 10/22 EF 55% normal RV Personally reviewed - NYHA I  - Volume status ok. Not needing lasix  - Continue coreg 3.125 mg BID - Off entresto for a few days Restart Entresto 49/51  bid - Off spiro due to low BP - ICD interrogated: No VT/AF. 97.7% Bivpacing. Activity level 3.5 hr/day. Optivol persistently elevated (not sure if accurate)  - Labs today - Needs repeat echo   2. LBBB - s/p MDT CRT-P in 9/20 - EF improved.  - Pacer interrogated personally. 98& Biv pacing  3. 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 Offered genetic screening but she wants to defer due to cost  - No evidence of arrythmias thus far. Will follow closely for need for [facility].   5. Daytime fatigue -sleep study 8/21 AHI 1.1 (normal)  6. HTN - BP up. Restart entresto    Arvilla Meres, MD 09/30/22

## 2022-09-30 NOTE — Patient Instructions (Signed)
There has been no changes to your medications.  Labs done today, your results will be available in MyChart, we will contact you for abnormal readings.  Your physician has requested that you have an echocardiogram. Echocardiography is a painless test that uses sound waves to create images of your heart. It provides your doctor with information about the size and shape of your heart and how well your heart's chambers and valves are working. This procedure takes approximately one hour. There are no restrictions for this procedure. Please do NOT wear cologne, perfume, aftershave, or lotions (deodorant is allowed). Please arrive 15 minutes prior to your appointment time.  Your physician recommends that you schedule a follow-up appointment in: 1 year ( August 2025) ** PLEASE CALL THE OFFICE IN MAY 2025 TO ARRANGE YOUR FOLLOW UP APPOINTMENT.**  If you have any questions or concerns before your next appointment please send [place] a message through [place] or call our office at [phone].    TO LEAVE A MESSAGE FOR THE NURSE SELECT OPTION 2, PLEASE LEAVE A MESSAGE INCLUDING: YOUR NAME DATE OF BIRTH CALL BACK NUMBER REASON FOR CALL**this is important as we prioritize the call backs  YOU WILL RECEIVE A CALL BACK THE SAME DAY AS LONG AS YOU CALL BEFORE 4:00 PM  At the [facility], you and your health needs are our priority. As part of our continuing mission to provide you with exceptional heart care, we have created designated Provider Care Teams. These Care Teams include your primary Cardiologist (physician) and Advanced Practice Providers (APPs- Physician Assistants and Nurse Practitioners) who all work together to provide you with the care you need, when you need it.   You may see any of the following providers on your designated Care Team at your next follow up: Dr Arvilla Meres Dr Marca Ancona Dr. Marcos Eke, NP Robbie Lis, [facility] [place], [place] Brynda Peon, NP Karle Plumber, PharmD   Please be sure to bring in all your medications bottles to every appointment.    Thank you for choosing [facility]

## 2022-10-14 ENCOUNTER — Ambulatory Visit (HOSPITAL_COMMUNITY)
Admission: RE | Admit: 2022-10-14 | Discharge: 2022-10-14 | Disposition: A | Discharge status: Home / Self Care | Payer: Medicare Other | Source: Ambulatory Visit | Attending: Family Medicine | Admitting: Family Medicine

## 2022-10-14 DIAGNOSIS — Z95810 Presence of automatic (implantable) cardiac defibrillator: Secondary | ICD-10-CM | POA: Insufficient documentation

## 2022-10-14 DIAGNOSIS — I5022 Chronic systolic (congestive) heart failure: Secondary | ICD-10-CM

## 2022-10-14 DIAGNOSIS — I349 Nonrheumatic mitral valve disorder, unspecified: Secondary | ICD-10-CM | POA: Diagnosis not present

## 2022-10-14 DIAGNOSIS — I110 Hypertensive heart disease with heart failure: Secondary | ICD-10-CM | POA: Insufficient documentation

## 2022-10-14 DIAGNOSIS — I429 Cardiomyopathy, unspecified: Secondary | ICD-10-CM | POA: Diagnosis not present

## 2022-10-14 LAB — ECHOCARDIOGRAM COMPLETE
AR max vel: 2.09 cm2
AV Area VTI: 1.95 cm2
AV Area mean vel: 1.99 cm2
AV Mean grad: 4 mmHg
AV Peak grad: 7.8 mmHg
Ao pk vel: 1.4 m/s
Area-P 1/2: 3.3 cm2
S' Lateral: 2.8 cm

## 2022-10-25 ENCOUNTER — Encounter: Payer: Self-pay | Admitting: Internal Medicine

## 2023-03-29 ENCOUNTER — Other Ambulatory Visit (HOSPITAL_COMMUNITY): Payer: Self-pay | Admitting: Internal Medicine

## 2023-04-30 ENCOUNTER — Other Ambulatory Visit (HOSPITAL_COMMUNITY): Payer: Self-pay | Admitting: Internal Medicine

## 2023-08-15 ENCOUNTER — Other Ambulatory Visit (HOSPITAL_COMMUNITY): Payer: Self-pay | Admitting: Internal Medicine

## 2023-08-28 ENCOUNTER — Other Ambulatory Visit (HOSPITAL_COMMUNITY): Payer: Self-pay | Admitting: Internal Medicine

## 2023-09-18 IMAGING — CT CT HEAD W/O CM
3 series · 14 of 47 positions shown, 16 images · non-contrast
Comparison: None.

CLINICAL DATA: Recent fall yesterday morning with headaches and
neck pain, initial encounter

EXAM:
CT HEAD WITHOUT CONTRAST
CT CERVICAL SPINE WITHOUT CONTRAST
TECHNIQUE: Multidetector CT imaging of the head and cervical spine was
performed following the standard protocol without intravenous
contrast. Multiplanar CT image reconstructions of the cervical spine
were also generated.

[Series 4: head 5.0 h30s · axial · 0.46mm/px · z∈[+74,+214]mm · 8 of 34 slices shown, 10 images]
[im 3/34  brain]
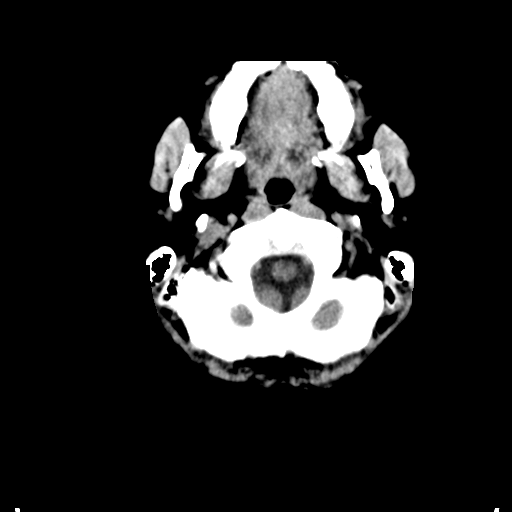
[im 3/34  bone]
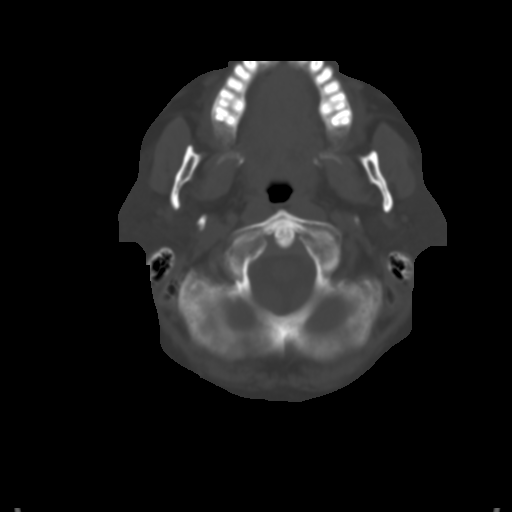
[im 7/34  brain]
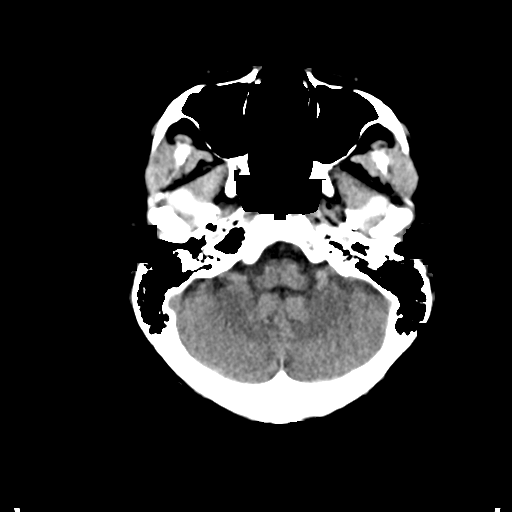
[im 11/34  brain]
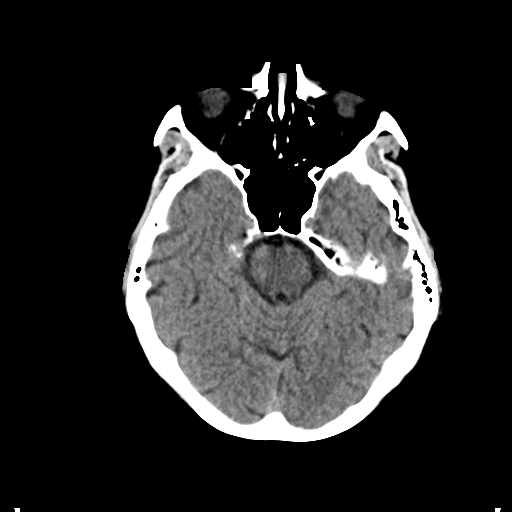
[im 15/34  brain]
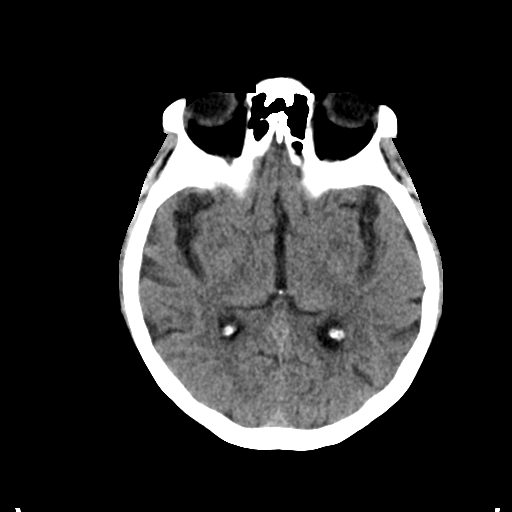
[im 19/34  brain]
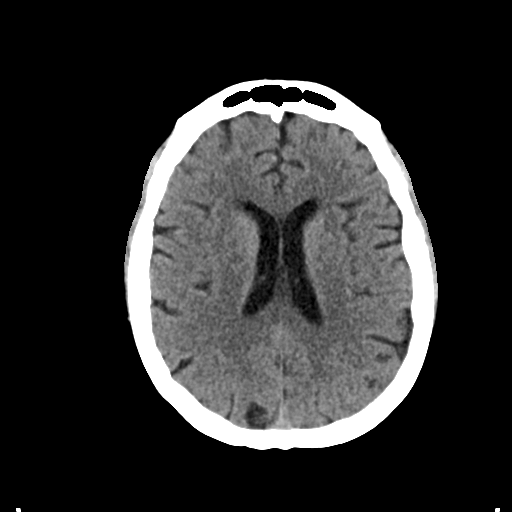
[im 19/34  bone]
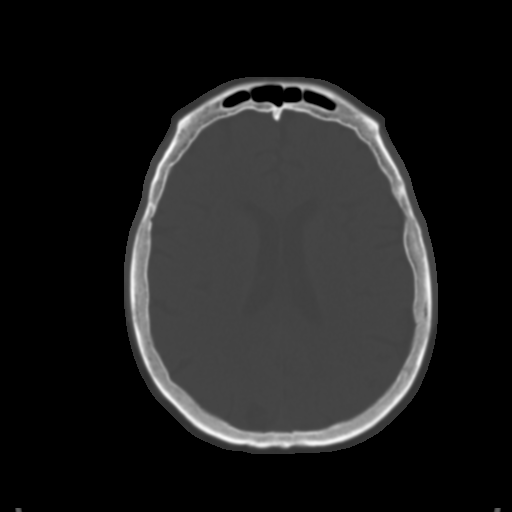
[im 23/34  brain]
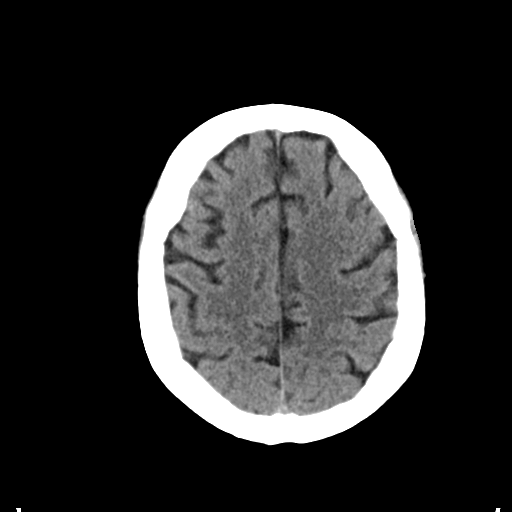
[im 27/34  brain]
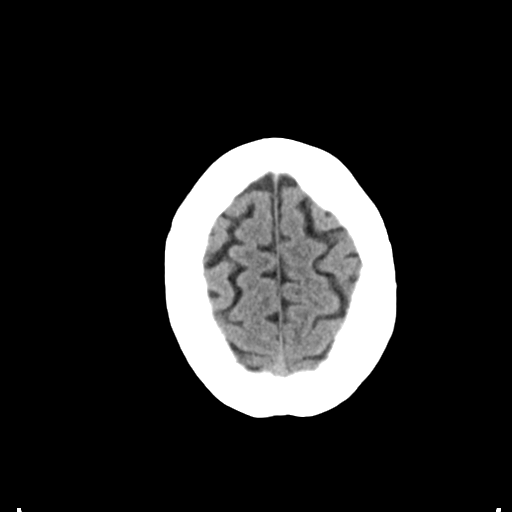
[im 31/34  brain]
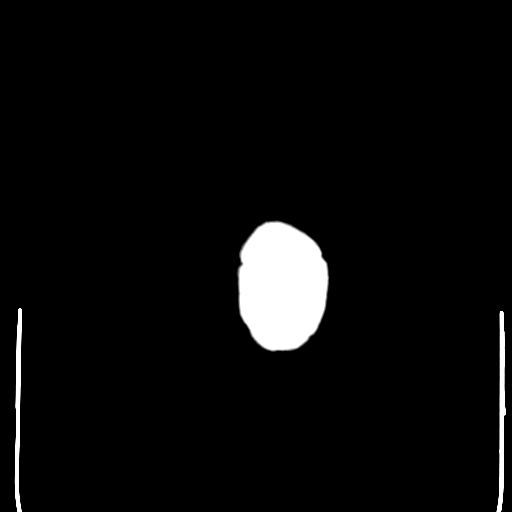

[Series 5: head 3.0 mpr cor · coronal · 0.33mm/px · 3 of 64 slices shown]
[im 22/64  brain]
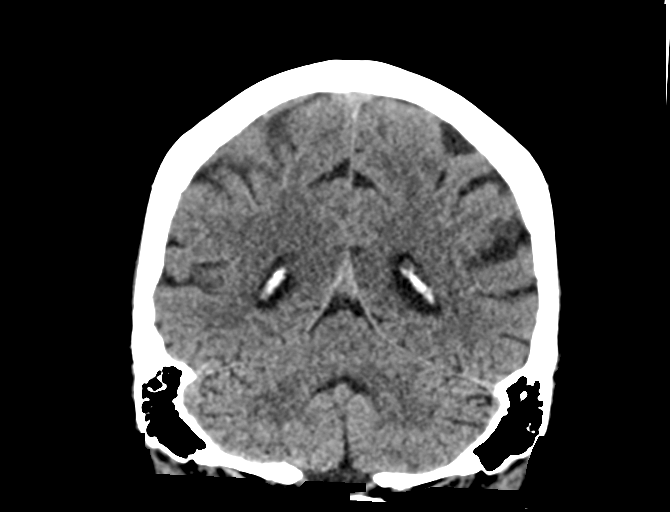
[im 29/64  brain]
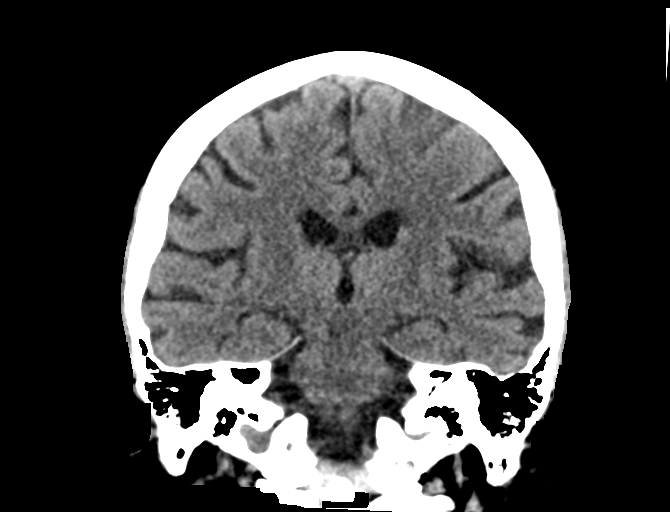
[im 36/64  brain]
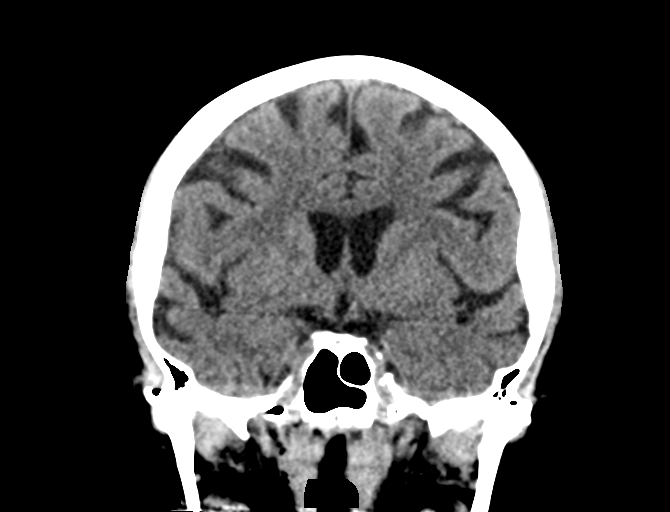

[Series 6: head 3.0 mpr sag · sagittal · 0.33mm/px · 3 of 56 slices shown]
[im 19/56  brain]
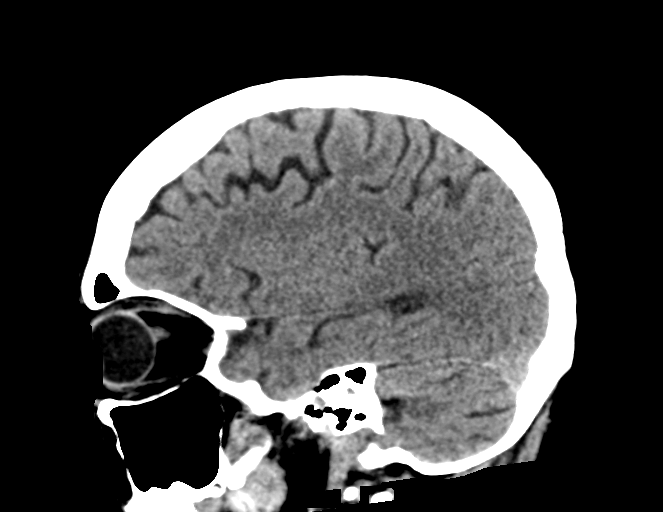
[im 28/56  brain]
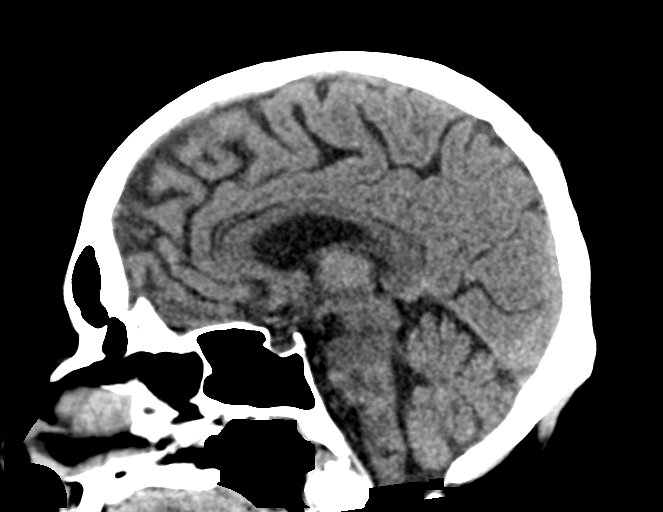
[im 37/56  brain]
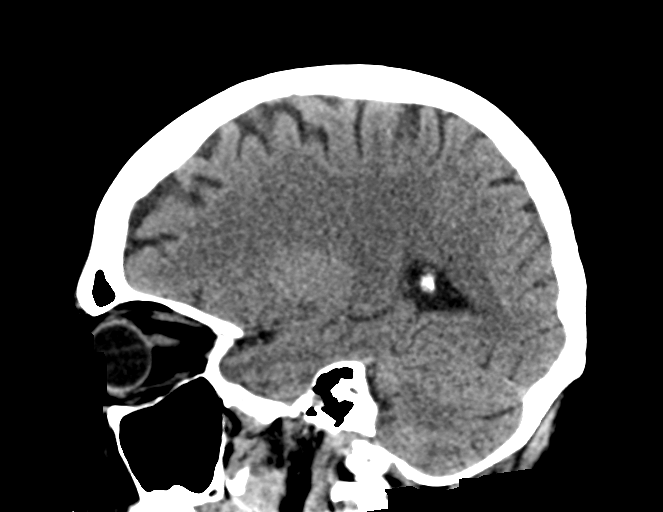

[14 of 47 positions shown; findings below may reference images not displayed]

FINDINGS: CT HEAD FINDINGS

Brain: No evidence of acute infarction, hemorrhage, hydrocephalus,
extra-axial collection or mass lesion/mass effect. Mild atrophic
changes are noted.

Vascular: No hyperdense vessel or unexpected calcification.

Skull: Normal. Negative for fracture or focal lesion.

Sinuses/Orbits: No acute finding.

Other: None.

CT CERVICAL SPINE FINDINGS

Alignment: Within normal limits.

Skull base and vertebrae: 7 cervical segments are well visualized.
Vertebral body height is well maintained. Disc space narrowing at
C5-6 and C6-7 is noted with associated osteophytic changes. Mild
facet hypertrophic changes are seen. No acute fracture or acute
facet abnormality is noted.

Soft tissues and spinal canal: Surrounding soft tissue structures
are within normal limits.

Upper chest: Visualized lung apices are unremarkable.

Other: None
IMPRESSION: CT of the head: Chronic atrophic changes without acute abnormality.

CT of the cervical spine: Multilevel degenerative change without
acute abnormality.

## 2023-10-05 ENCOUNTER — Other Ambulatory Visit (HOSPITAL_COMMUNITY): Payer: Self-pay

## 2023-10-05 DIAGNOSIS — I5022 Chronic systolic (congestive) heart failure: Secondary | ICD-10-CM

## 2023-10-05 MED ORDER — SACUBITRIL-VALSARTAN 49-51 MG PO TABS: 1.0000 | ORAL_TABLET | Freq: Two times a day (BID) | ORAL | 0 refills | Status: DC

## 2023-10-05 NOTE — Telephone Encounter (Signed)
 Attempted to call patient  to call our office to schedule a office visit. However was unable to leave a voice message.

## 2023-11-13 ENCOUNTER — Other Ambulatory Visit (HOSPITAL_COMMUNITY): Payer: Self-pay

## 2023-11-13 ENCOUNTER — Other Ambulatory Visit (HOSPITAL_COMMUNITY): Payer: Self-pay | Admitting: Internal Medicine

## 2023-11-13 DIAGNOSIS — I5022 Chronic systolic (congestive) heart failure: Secondary | ICD-10-CM

## 2023-11-13 MED ORDER — SACUBITRIL-VALSARTAN 49-51 MG PO TABS: 1.0000 | ORAL_TABLET | Freq: Two times a day (BID) | ORAL | 1 refills | Status: DC

## 2024-02-05 ENCOUNTER — Telehealth (HOSPITAL_COMMUNITY): Payer: Self-pay

## 2024-02-05 NOTE — Telephone Encounter (Signed)
 Attempted to call patient however, was unable to leave a voice message because patient's mailbox was full.  Voice message was also left on pt's sister's phone for patient to give our office a call for future medication refills.

## 2024-02-07 ENCOUNTER — Other Ambulatory Visit (HOSPITAL_COMMUNITY): Payer: Self-pay

## 2024-02-07 DIAGNOSIS — I5022 Chronic systolic (congestive) heart failure: Secondary | ICD-10-CM

## 2024-02-07 MED ORDER — SACUBITRIL-VALSARTAN 49-51 MG PO TABS: 1.0000 | ORAL_TABLET | Freq: Two times a day (BID) | ORAL | 1 refills | Status: AC

## 2024-02-15 ENCOUNTER — Encounter

## 2024-02-23 ENCOUNTER — Other Ambulatory Visit (HOSPITAL_COMMUNITY): Payer: Self-pay

## 2024-02-23 DIAGNOSIS — I5022 Chronic systolic (congestive) heart failure: Secondary | ICD-10-CM

## 2024-02-23 MED ORDER — POTASSIUM CHLORIDE CRYS ER 20 MEQ PO TBCR: 20.0000 meq | EXTENDED_RELEASE_TABLET | Freq: Every day | ORAL | 0 refills | Status: AC

## 2024-02-23 NOTE — Telephone Encounter (Signed)
 Tried calling patient to schedule a office visit however was unable to leave a voice message.

## 2024-05-16 ENCOUNTER — Encounter

## 2024-08-15 ENCOUNTER — Encounter

## 2024-11-14 ENCOUNTER — Encounter
